# Patient Record
Sex: Male | Born: 1940 | Race: Black or African American | Hispanic: No | Marital: Married | Smoking: Former smoker
Health system: Southern US, Community
[De-identification: ages and names within clinical notes are randomized; demographics above are authoritative.]

## PROBLEM LIST (undated history)

## (undated) DIAGNOSIS — R630 Anorexia: Secondary | ICD-10-CM

## (undated) DIAGNOSIS — I10 Essential (primary) hypertension: Secondary | ICD-10-CM

## (undated) DIAGNOSIS — R634 Abnormal weight loss: Secondary | ICD-10-CM

## (undated) DIAGNOSIS — N400 Enlarged prostate without lower urinary tract symptoms: Secondary | ICD-10-CM

## (undated) DIAGNOSIS — I2699 Other pulmonary embolism without acute cor pulmonale: Secondary | ICD-10-CM

## (undated) DIAGNOSIS — C787 Secondary malignant neoplasm of liver and intrahepatic bile duct: Secondary | ICD-10-CM

## (undated) DIAGNOSIS — N390 Urinary tract infection, site not specified: Secondary | ICD-10-CM

## (undated) DIAGNOSIS — C259 Malignant neoplasm of pancreas, unspecified: Principal | ICD-10-CM

## (undated) DIAGNOSIS — E46 Unspecified protein-calorie malnutrition: Secondary | ICD-10-CM

## (undated) DIAGNOSIS — K649 Unspecified hemorrhoids: Secondary | ICD-10-CM

## (undated) DIAGNOSIS — K831 Obstruction of bile duct: Secondary | ICD-10-CM

## (undated) DIAGNOSIS — K219 Gastro-esophageal reflux disease without esophagitis: Secondary | ICD-10-CM

## (undated) DIAGNOSIS — C7800 Secondary malignant neoplasm of unspecified lung: Secondary | ICD-10-CM

## (undated) DIAGNOSIS — K8689 Other specified diseases of pancreas: Secondary | ICD-10-CM

## (undated) HISTORY — DX: Gastro-esophageal reflux disease without esophagitis: K21.9

## (undated) HISTORY — DX: Secondary malignant neoplasm of unspecified lung: C78.00

## (undated) HISTORY — DX: Obstruction of bile duct: K83.1

## (undated) HISTORY — DX: Essential (primary) hypertension: I10

## (undated) HISTORY — DX: Secondary malignant neoplasm of liver and intrahepatic bile duct: C78.7

## (undated) HISTORY — DX: Anorexia: R63.0

## (undated) HISTORY — DX: Other specified diseases of pancreas: K86.89

## (undated) HISTORY — DX: Malignant neoplasm of pancreas, unspecified: C25.9

## (undated) HISTORY — PX: PORTACATH PLACEMENT: SHX2246

## (undated) HISTORY — DX: Abnormal weight loss: R63.4

## (undated) HISTORY — DX: Other pulmonary embolism without acute cor pulmonale: I26.99

## (undated) HISTORY — DX: Benign prostatic hyperplasia without lower urinary tract symptoms: N40.0

## (undated) HISTORY — DX: Unspecified hemorrhoids: K64.9

---

## 2010-09-09 DIAGNOSIS — C787 Secondary malignant neoplasm of liver and intrahepatic bile duct: Secondary | ICD-10-CM

## 2010-09-09 DIAGNOSIS — R630 Anorexia: Secondary | ICD-10-CM

## 2010-09-09 DIAGNOSIS — C259 Malignant neoplasm of pancreas, unspecified: Secondary | ICD-10-CM

## 2010-09-09 DIAGNOSIS — R634 Abnormal weight loss: Secondary | ICD-10-CM

## 2010-09-09 DIAGNOSIS — K831 Obstruction of bile duct: Secondary | ICD-10-CM

## 2010-09-09 DIAGNOSIS — N400 Enlarged prostate without lower urinary tract symptoms: Secondary | ICD-10-CM

## 2010-09-09 DIAGNOSIS — C7800 Secondary malignant neoplasm of unspecified lung: Secondary | ICD-10-CM

## 2010-09-09 DIAGNOSIS — I2699 Other pulmonary embolism without acute cor pulmonale: Secondary | ICD-10-CM

## 2010-09-09 HISTORY — DX: Malignant neoplasm of pancreas, unspecified: C25.9

## 2010-09-09 HISTORY — DX: Other pulmonary embolism without acute cor pulmonale: I26.99

## 2010-09-09 HISTORY — DX: Secondary malignant neoplasm of unspecified lung: C78.00

## 2010-09-09 HISTORY — DX: Secondary malignant neoplasm of liver and intrahepatic bile duct: C78.7

## 2010-09-09 HISTORY — DX: Anorexia: R63.0

## 2010-09-09 HISTORY — DX: Obstruction of bile duct: K83.1

## 2010-09-09 HISTORY — DX: Benign prostatic hyperplasia without lower urinary tract symptoms: N40.0

## 2010-09-09 HISTORY — DX: Abnormal weight loss: R63.4

## 2011-05-30 ENCOUNTER — Other Ambulatory Visit: Payer: Self-pay | Admitting: Gastroenterology

## 2011-05-30 DIAGNOSIS — K3189 Other diseases of stomach and duodenum: Secondary | ICD-10-CM

## 2011-05-31 ENCOUNTER — Ambulatory Visit
Admission: RE | Admit: 2011-05-31 | Discharge: 2011-05-31 | Disposition: A | Discharge status: Home / Self Care | Payer: Medicare Other | Source: Ambulatory Visit | Attending: Gastroenterology | Admitting: Gastroenterology

## 2011-05-31 DIAGNOSIS — K3189 Other diseases of stomach and duodenum: Secondary | ICD-10-CM

## 2011-05-31 MED ORDER — IOHEXOL 300 MG/ML  SOLN: 125.0000 mL | Freq: Once | INTRAMUSCULAR | Status: AC | PRN

## 2011-06-05 ENCOUNTER — Other Ambulatory Visit: Payer: Self-pay | Admitting: Oncology

## 2011-06-05 ENCOUNTER — Encounter (HOSPITAL_BASED_OUTPATIENT_CLINIC_OR_DEPARTMENT_OTHER): Payer: Medicare Other | Admitting: Oncology

## 2011-06-05 ENCOUNTER — Encounter: Payer: Medicare Other | Admitting: Oncology

## 2011-06-05 DIAGNOSIS — C259 Malignant neoplasm of pancreas, unspecified: Secondary | ICD-10-CM

## 2011-06-05 LAB — CBC WITH DIFFERENTIAL/PLATELET
Basophils Absolute: 0 10*3/uL (ref 0.0–0.1)
HCT: 42.1 % (ref 38.4–49.9)
HGB: 14 g/dL (ref 13.0–17.1)
MONO#: 0.7 10*3/uL (ref 0.1–0.9)
NEUT#: 7.4 10*3/uL — ABNORMAL HIGH (ref 1.5–6.5)
NEUT%: 81.1 % — ABNORMAL HIGH (ref 39.0–75.0)
RDW: 14.8 % — ABNORMAL HIGH (ref 11.0–14.6)
WBC: 9.1 10*3/uL (ref 4.0–10.3)
lymph#: 1 10*3/uL (ref 0.9–3.3)

## 2011-06-05 LAB — COMPREHENSIVE METABOLIC PANEL
ALT: 278 U/L — ABNORMAL HIGH (ref 0–53)
AST: 168 U/L — ABNORMAL HIGH (ref 0–37)
Albumin: 3.5 g/dL (ref 3.5–5.2)
BUN: 16 mg/dL (ref 6–23)
CO2: 22 mEq/L (ref 19–32)
Calcium: 10 mg/dL (ref 8.4–10.5)
Chloride: 105 mEq/L (ref 96–112)
Creatinine, Ser: 0.76 mg/dL (ref 0.50–1.35)
Potassium: 3 mEq/L — ABNORMAL LOW (ref 3.5–5.3)

## 2011-06-06 ENCOUNTER — Other Ambulatory Visit: Payer: Self-pay | Admitting: Oncology

## 2011-06-06 DIAGNOSIS — C259 Malignant neoplasm of pancreas, unspecified: Secondary | ICD-10-CM

## 2011-06-06 LAB — CANCER ANTIGEN 19-9: CA 19-9: 123329.1 U/mL — ABNORMAL HIGH (ref <35.0)

## 2011-06-07 ENCOUNTER — Inpatient Hospital Stay (HOSPITAL_COMMUNITY)
Admission: AD | Admit: 2011-06-07 | Discharge: 2011-06-12 | DRG: 436 | Disposition: A | Discharge status: Home / Self Care | Payer: Medicare Other | Source: Ambulatory Visit | Attending: Oncology | Admitting: Oncology

## 2011-06-07 DIAGNOSIS — B9689 Other specified bacterial agents as the cause of diseases classified elsewhere: Secondary | ICD-10-CM | POA: Diagnosis present

## 2011-06-07 DIAGNOSIS — C787 Secondary malignant neoplasm of liver and intrahepatic bile duct: Secondary | ICD-10-CM | POA: Diagnosis present

## 2011-06-07 DIAGNOSIS — K5900 Constipation, unspecified: Secondary | ICD-10-CM | POA: Diagnosis not present

## 2011-06-07 DIAGNOSIS — K838 Other specified diseases of biliary tract: Secondary | ICD-10-CM | POA: Diagnosis present

## 2011-06-07 DIAGNOSIS — N138 Other obstructive and reflux uropathy: Secondary | ICD-10-CM | POA: Diagnosis present

## 2011-06-07 DIAGNOSIS — C259 Malignant neoplasm of pancreas, unspecified: Principal | ICD-10-CM

## 2011-06-07 DIAGNOSIS — C7800 Secondary malignant neoplasm of unspecified lung: Secondary | ICD-10-CM | POA: Diagnosis present

## 2011-06-07 DIAGNOSIS — I10 Essential (primary) hypertension: Secondary | ICD-10-CM | POA: Diagnosis present

## 2011-06-07 DIAGNOSIS — R339 Retention of urine, unspecified: Secondary | ICD-10-CM | POA: Diagnosis not present

## 2011-06-07 DIAGNOSIS — G893 Neoplasm related pain (acute) (chronic): Secondary | ICD-10-CM | POA: Diagnosis present

## 2011-06-07 DIAGNOSIS — R5381 Other malaise: Secondary | ICD-10-CM | POA: Diagnosis present

## 2011-06-07 DIAGNOSIS — K219 Gastro-esophageal reflux disease without esophagitis: Secondary | ICD-10-CM | POA: Diagnosis present

## 2011-06-07 DIAGNOSIS — C253 Malignant neoplasm of pancreatic duct: Secondary | ICD-10-CM

## 2011-06-07 DIAGNOSIS — N401 Enlarged prostate with lower urinary tract symptoms: Secondary | ICD-10-CM | POA: Diagnosis present

## 2011-06-07 DIAGNOSIS — E119 Type 2 diabetes mellitus without complications: Secondary | ICD-10-CM | POA: Diagnosis present

## 2011-06-07 DIAGNOSIS — R1011 Right upper quadrant pain: Secondary | ICD-10-CM | POA: Diagnosis present

## 2011-06-07 DIAGNOSIS — E440 Moderate protein-calorie malnutrition: Secondary | ICD-10-CM | POA: Diagnosis present

## 2011-06-07 HISTORY — PX: OTHER SURGICAL HISTORY: SHX169

## 2011-06-07 LAB — CBC
HCT: 39.6 % (ref 39.0–52.0)
MCH: 26.3 pg (ref 26.0–34.0)
MCHC: 32.6 g/dL (ref 30.0–36.0)
MCV: 80.8 fL (ref 78.0–100.0)
RDW: 15.5 % (ref 11.5–15.5)

## 2011-06-07 LAB — GLUCOSE, CAPILLARY: Glucose-Capillary: 138 mg/dL — ABNORMAL HIGH (ref 70–99)

## 2011-06-07 LAB — COMPREHENSIVE METABOLIC PANEL
ALT: 247 U/L — ABNORMAL HIGH (ref 0–53)
Albumin: 3.3 g/dL — ABNORMAL LOW (ref 3.5–5.2)
Alkaline Phosphatase: 231 U/L — ABNORMAL HIGH (ref 39–117)
Calcium: 10.2 mg/dL (ref 8.4–10.5)
Potassium: 4 mEq/L (ref 3.5–5.1)
Sodium: 138 mEq/L (ref 135–145)
Total Protein: 7.7 g/dL (ref 6.0–8.3)

## 2011-06-07 LAB — PROTIME-INR: INR: 1.29 (ref 0.00–1.49)

## 2011-06-07 MED ORDER — IOHEXOL 300 MG/ML  SOLN
50.0000 mL | Freq: Once | INTRAMUSCULAR | Status: AC | PRN
  Administered 2011-06-07: 30 mL via INTRATHECAL

## 2011-06-08 DIAGNOSIS — R17 Unspecified jaundice: Secondary | ICD-10-CM

## 2011-06-08 DIAGNOSIS — C253 Malignant neoplasm of pancreatic duct: Secondary | ICD-10-CM

## 2011-06-08 LAB — COMPREHENSIVE METABOLIC PANEL
AST: 91 U/L — ABNORMAL HIGH (ref 0–37)
Albumin: 2.8 g/dL — ABNORMAL LOW (ref 3.5–5.2)
BUN: 11 mg/dL (ref 6–23)
Chloride: 106 mEq/L (ref 96–112)
Creatinine, Ser: 0.65 mg/dL (ref 0.50–1.35)
Total Protein: 6.7 g/dL (ref 6.0–8.3)

## 2011-06-08 LAB — GLUCOSE, CAPILLARY
Glucose-Capillary: 163 mg/dL — ABNORMAL HIGH (ref 70–99)
Glucose-Capillary: 165 mg/dL — ABNORMAL HIGH (ref 70–99)
Glucose-Capillary: 173 mg/dL — ABNORMAL HIGH (ref 70–99)
Glucose-Capillary: 148 mg/dL — ABNORMAL HIGH (ref 70–99)
Glucose-Capillary: 154 mg/dL — ABNORMAL HIGH (ref 70–99)

## 2011-06-08 LAB — CBC
HCT: 36.8 % — ABNORMAL LOW (ref 39.0–52.0)
MCHC: 32.6 g/dL (ref 30.0–36.0)
MCV: 81.8 fL (ref 78.0–100.0)
Platelets: 206 10*3/uL (ref 150–400)
RDW: 15.4 % (ref 11.5–15.5)
WBC: 10.4 10*3/uL (ref 4.0–10.5)

## 2011-06-08 NOTE — H&P (Signed)
NAMEMarland Kitchen  DELVIS, KAU NO.:  [id number]  MEDICAL RECORD NO.:  [id number]  LOCATION:  1309                         FACILITY:  [facility]  PHYSICIAN:  Exie Parody, M.D.        DATE OF BIRTH:  Jul 05, 1941  DATE OF ADMISSION:  06/07/2011 DATE OF DISCHARGE:                             HISTORY & PHYSICAL   CHIEF COMPLAINT:  Jaundice.  REASON FOR ADMISSION:  Placement of percutaneous bili drainage and admission to ensure he has no sepsis.  HISTORY OF PRESENT ILLNESS:  Mr. Nyborg is a 70 year old African American man with history of diabetes mellitus type 2 and hypertension. He was subsequently diagnosed with metastatic pancreatic cancer.  His disease was suspected in the liver and in the lungs.  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 He underwent percutaneous placement of biliary drainage by Dr. Ruel Favors from IR today.  Routine admission for the next few days to ensure there is enough biliary sepsis given his high risk.  I saw the patient today after admission in the hospital room, fourth floor.  He was there with his wife and his daughter.  He reported that he has some mild abdominal discomfort from the cancer in addition to the procedure, however, it was not so severe.  He thinks that the procedure has relieved some pressure and some pain in the right upper quadrant as well.  He has some mild nausea, however, no vomiting.  He has fatigue and decreased appetite.  He denies shortness of breath, chest pain, abdominal pain, abdominal swelling, melena, hematochezia, hematuria. The rest of the 14-point review of system was negative.  PAST MEDICAL HISTORY: 1. Recently diagnosed history of metastatic pancreatic cancer. 2. Diabetes mellitus type 2. 3. Hypertension. 4. Gastroesophageal reflux disease. 5. History of hemorrhoid. 6. Erectile dysfunction. 7. Insomnia. 8. Benign prostatic hypertrophy.  PAST SURGICAL HISTORY:   None.  CURRENT OUTPATIENT MEDICATIONS:  Amlodipine, aspirin, benazepril, omeprazole, sitagliptin, sucralfate.  ALLERGIES:  No known drug allergy.  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  He used to smoke cigarette.  He quit 30 years ago.  He used to drink alcohol heavily, however, quit 30 years ago as well.  He has two daughters, age 74 and age 21.  The 76 year old daughter lives in [place]; the 9 year old daughter lives here and she is here with him today, described as supportive.  She works for a [employer].  FAMILY HISTORY:  Father deceased from stroke and mother deceased from cervical cancer.  He had two brothers, both deceased; one from congestive heart failure and the other from valvular disease and heart attack.  The rest of family history was negative for breast cancer, brain cancer, endometrial cancer, colon cancer.  One of his brother had metastatic pancreatic cancer as well.  PHYSICAL EXAM:  General:  well-nourished in no acute distress.  Eyes:  jaundice.  ENT:  There were no oropharyngeal lesions.  Neck was without thyromegaly.  Lymphatics:  Negative cervical, supraclavicular or axillary adenopathy.  Respiratory: lungs were  clear bilaterally without wheezing  or crackles.  Cardiovascular:  Regular rate and rhythm, S1/S2, without murmur, rub or gallop.  There was no pedal edema.  GI:  abdomen was soft, flat, nontender, nondistended, without organomegaly.   Percutaneous biliary drainage tube in place without bleeding.  Muscoloskeletal:  no spinal tenderness of palpation of vertebral spine.  Skin exam was without echymosis, petichae.  Neuro exam was nonfocal.  Patient was able to get on and off exam table  without assistance.  Gait was normal.  Patient was alerted and oriented.  Attention was good.   Language was appropriate.  Mood was normal without depression.  Speech was not pressured.  Thought  content was not tangential.     LABS:  Showed WBC 9.5, hemoglobin 12.9, platelet count of 228, bilirubin of 9.5, alk phos 231, AST 152, ALT 247, total protein 7.7, albumin of 3.3, calcium 10.2.  ASSESSMENT AND PLAN:  Metastatic pancreatic cancer:  I discussed with him and his relatives that we will see him again in clinic next week to see whether he has improving cholestasis.  If his T-bili is less than 2, then I will encourage him to proceed in the gemcitabine plus/minus Onconova clinical trial.  If his bilirubin improves, but however not good enough, then we may consider FOLFIRINOX.   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  We will observe him for the next few days to ensure he has no sign of sepsis or infection.  If he has no sign of infections, then on Monday June 10, 2011,  IR will place a Port-A-Cath and can go home and get outpatient workup with CT of the chest and a PET scan before chemotherapy starting.  Diabetes mellitus:  He is on sitagliptin.  We will have him check his sugar four times a day while he is on hospital and insulin sliding scale as needed.  Hypertension:  He is on amlodipine and benazepril.  Gastroesophageal reflux disease:  He is on omeprazole and sucralfate.  Code status:  Full code.  Protein-calorie malnutrition:  This is mild to moderate from his metastatic pancreatic cancer.  I requested a nutritional consultation.  DVT prophylaxis:  He is on SCD compression stocking until he has evidence of complications from a biliary drainage placement.     Exie Parody, M.D.     HTH/MEDQ  D:  06/07/2011  T:  06/07/2011  Job:  [id number]  cc:   Jordan Hawks. [name], MD Fax: [phone]  Ruel Favors, MD  Electronically Signed by Jethro Bolus MD on 06/08/2011 10:37:35 AM

## 2011-06-09 LAB — CBC
HCT: 36.6 % — ABNORMAL LOW (ref 39.0–52.0)
Hemoglobin: 11.9 g/dL — ABNORMAL LOW (ref 13.0–17.0)
MCH: 26.5 pg (ref 26.0–34.0)
MCV: 81.5 fL (ref 78.0–100.0)
Platelets: 192 10*3/uL (ref 150–400)
RBC: 4.49 MIL/uL (ref 4.22–5.81)
WBC: 10.2 10*3/uL (ref 4.0–10.5)

## 2011-06-09 LAB — COMPREHENSIVE METABOLIC PANEL
AST: 68 U/L — ABNORMAL HIGH (ref 0–37)
BUN: 9 mg/dL (ref 6–23)
CO2: 24 mEq/L (ref 19–32)
Chloride: 104 mEq/L (ref 96–112)
Creatinine, Ser: 0.57 mg/dL (ref 0.50–1.35)
GFR calc Af Amer: >60 mL/min (ref >60)
GFR calc non Af Amer: >60 mL/min (ref >60)
Glucose, Bld: 150 mg/dL — ABNORMAL HIGH (ref 70–99)
Total Bilirubin: 6.4 mg/dL — ABNORMAL HIGH (ref 0.3–1.2)

## 2011-06-09 LAB — GLUCOSE, CAPILLARY: Glucose-Capillary: 143 mg/dL — ABNORMAL HIGH (ref 70–99)

## 2011-06-10 ENCOUNTER — Other Ambulatory Visit (HOSPITAL_COMMUNITY): Payer: Medicare Other

## 2011-06-10 ENCOUNTER — Inpatient Hospital Stay (HOSPITAL_COMMUNITY): Payer: Medicare Other

## 2011-06-10 DIAGNOSIS — K838 Other specified diseases of biliary tract: Secondary | ICD-10-CM

## 2011-06-10 DIAGNOSIS — C253 Malignant neoplasm of pancreatic duct: Secondary | ICD-10-CM

## 2011-06-10 LAB — CBC
HCT: 36.7 % — ABNORMAL LOW (ref 39.0–52.0)
MCH: 26.5 pg (ref 26.0–34.0)
MCV: 81.2 fL (ref 78.0–100.0)
RDW: 15.2 % (ref 11.5–15.5)
WBC: 9.7 10*3/uL (ref 4.0–10.5)

## 2011-06-10 LAB — PROTIME-INR
INR: 1.42 (ref 0.00–1.49)
Prothrombin Time: 17.6 seconds — ABNORMAL HIGH (ref 11.6–15.2)

## 2011-06-10 LAB — COMPREHENSIVE METABOLIC PANEL
BUN: 8 mg/dL (ref 6–23)
CO2: 24 mEq/L (ref 19–32)
Chloride: 101 mEq/L (ref 96–112)
Creatinine, Ser: 0.52 mg/dL (ref 0.50–1.35)
GFR calc non Af Amer: >90 mL/min (ref >90)
Total Bilirubin: 6.8 mg/dL — ABNORMAL HIGH (ref 0.3–1.2)

## 2011-06-10 LAB — GLUCOSE, CAPILLARY: Glucose-Capillary: 144 mg/dL — ABNORMAL HIGH (ref 70–99)

## 2011-06-11 LAB — CBC
HCT: 34.9 % — ABNORMAL LOW (ref 39.0–52.0)
Hemoglobin: 11.3 g/dL — ABNORMAL LOW (ref 13.0–17.0)
RBC: 4.32 MIL/uL (ref 4.22–5.81)
WBC: 8.7 10*3/uL (ref 4.0–10.5)

## 2011-06-11 LAB — COMPREHENSIVE METABOLIC PANEL
ALT: 103 U/L — ABNORMAL HIGH (ref 0–53)
Alkaline Phosphatase: 163 U/L — ABNORMAL HIGH (ref 39–117)
BUN: 8 mg/dL (ref 6–23)
CO2: 24 mEq/L (ref 19–32)
Chloride: 104 mEq/L (ref 96–112)
GFR calc Af Amer: >90 mL/min (ref >90)
Glucose, Bld: 158 mg/dL — ABNORMAL HIGH (ref 70–99)
Potassium: 3.1 mEq/L — ABNORMAL LOW (ref 3.5–5.1)
Sodium: 137 mEq/L (ref 135–145)
Total Bilirubin: 6.9 mg/dL — ABNORMAL HIGH (ref 0.3–1.2)

## 2011-06-11 LAB — BODY FLUID CULTURE: Gram Stain: NONE SEEN

## 2011-06-11 LAB — GLUCOSE, CAPILLARY
Glucose-Capillary: 154 mg/dL — ABNORMAL HIGH (ref 70–99)
Glucose-Capillary: 135 mg/dL — ABNORMAL HIGH (ref 70–99)
Glucose-Capillary: 127 mg/dL — ABNORMAL HIGH (ref 70–99)

## 2011-06-12 ENCOUNTER — Other Ambulatory Visit (HOSPITAL_COMMUNITY): Payer: Medicare Other

## 2011-06-12 ENCOUNTER — Inpatient Hospital Stay (HOSPITAL_COMMUNITY)
Admission: RE | Admit: 2011-06-12 | Discharge: 2011-06-12 | Payer: Medicare Other | Source: Ambulatory Visit | Attending: Oncology | Admitting: Oncology

## 2011-06-12 LAB — CBC
HCT: 33.2 % — ABNORMAL LOW (ref 39.0–52.0)
MCV: 79.8 fL (ref 78.0–100.0)
Platelets: 190 10*3/uL (ref 150–400)
RBC: 4.16 MIL/uL — ABNORMAL LOW (ref 4.22–5.81)
RDW: 15.4 % (ref 11.5–15.5)
WBC: 8.2 10*3/uL (ref 4.0–10.5)

## 2011-06-12 LAB — BASIC METABOLIC PANEL
CO2: 26 mEq/L (ref 19–32)
Calcium: 9.1 mg/dL (ref 8.4–10.5)
Chloride: 105 mEq/L (ref 96–112)
Creatinine, Ser: 0.5 mg/dL (ref 0.50–1.35)
GFR calc Af Amer: >90 mL/min (ref >90)
Sodium: 138 mEq/L (ref 135–145)

## 2011-06-12 LAB — GLUCOSE, CAPILLARY

## 2011-06-18 ENCOUNTER — Encounter (HOSPITAL_COMMUNITY)
Admit: 2011-06-18 | Discharge: 2011-06-18 | Disposition: A | Discharge status: Home / Self Care | Payer: Medicare Other | Attending: Oncology | Admitting: Oncology

## 2011-06-18 DIAGNOSIS — N400 Enlarged prostate without lower urinary tract symptoms: Secondary | ICD-10-CM | POA: Insufficient documentation

## 2011-06-18 DIAGNOSIS — R918 Other nonspecific abnormal finding of lung field: Secondary | ICD-10-CM | POA: Insufficient documentation

## 2011-06-18 DIAGNOSIS — N323 Diverticulum of bladder: Secondary | ICD-10-CM | POA: Insufficient documentation

## 2011-06-18 DIAGNOSIS — R599 Enlarged lymph nodes, unspecified: Secondary | ICD-10-CM | POA: Insufficient documentation

## 2011-06-18 DIAGNOSIS — C259 Malignant neoplasm of pancreas, unspecified: Secondary | ICD-10-CM

## 2011-06-18 DIAGNOSIS — C787 Secondary malignant neoplasm of liver and intrahepatic bile duct: Secondary | ICD-10-CM | POA: Insufficient documentation

## 2011-06-18 DIAGNOSIS — E041 Nontoxic single thyroid nodule: Secondary | ICD-10-CM | POA: Insufficient documentation

## 2011-06-18 DIAGNOSIS — C250 Malignant neoplasm of head of pancreas: Secondary | ICD-10-CM | POA: Insufficient documentation

## 2011-06-18 DIAGNOSIS — N210 Calculus in bladder: Secondary | ICD-10-CM | POA: Insufficient documentation

## 2011-06-18 DIAGNOSIS — J90 Pleural effusion, not elsewhere classified: Secondary | ICD-10-CM | POA: Insufficient documentation

## 2011-06-18 LAB — GLUCOSE, CAPILLARY: Glucose-Capillary: 154 mg/dL — ABNORMAL HIGH (ref 70–99)

## 2011-06-18 MED ORDER — FLUDEOXYGLUCOSE F - 18 (FDG) INJECTION
17.4000 | Freq: Once | INTRAVENOUS | Status: AC | PRN
  Administered 2011-06-18: 17.4 via INTRAVENOUS

## 2011-06-18 MED ORDER — IOHEXOL 300 MG/ML  SOLN
80.0000 mL | Freq: Once | INTRAMUSCULAR | Status: AC | PRN
  Administered 2011-06-18: 80 mL via INTRAVENOUS

## 2011-06-19 ENCOUNTER — Other Ambulatory Visit: Payer: Self-pay | Admitting: Oncology

## 2011-06-19 ENCOUNTER — Encounter: Payer: Medicare Other | Admitting: Oncology

## 2011-06-19 ENCOUNTER — Encounter (HOSPITAL_BASED_OUTPATIENT_CLINIC_OR_DEPARTMENT_OTHER): Payer: Medicare Other | Admitting: Oncology

## 2011-06-19 DIAGNOSIS — I2699 Other pulmonary embolism without acute cor pulmonale: Secondary | ICD-10-CM

## 2011-06-19 DIAGNOSIS — C259 Malignant neoplasm of pancreas, unspecified: Secondary | ICD-10-CM

## 2011-06-19 DIAGNOSIS — C801 Malignant (primary) neoplasm, unspecified: Secondary | ICD-10-CM

## 2011-06-19 DIAGNOSIS — C787 Secondary malignant neoplasm of liver and intrahepatic bile duct: Secondary | ICD-10-CM

## 2011-06-19 DIAGNOSIS — C7800 Secondary malignant neoplasm of unspecified lung: Secondary | ICD-10-CM

## 2011-06-19 LAB — COMPREHENSIVE METABOLIC PANEL
ALT: 125 U/L — ABNORMAL HIGH (ref 0–53)
AST: 116 U/L — ABNORMAL HIGH (ref 0–37)
Albumin: 3.6 g/dL (ref 3.5–5.2)
Alkaline Phosphatase: 160 U/L — ABNORMAL HIGH (ref 39–117)
BUN: 18 mg/dL (ref 6–23)
Calcium: 9.6 mg/dL (ref 8.4–10.5)
Chloride: 101 mEq/L (ref 96–112)
Potassium: 3.9 mEq/L (ref 3.5–5.3)
Sodium: 137 mEq/L (ref 135–145)
Total Protein: 7.3 g/dL (ref 6.0–8.3)

## 2011-06-19 LAB — CBC WITH DIFFERENTIAL/PLATELET
BASO%: 0.5 % (ref 0.0–2.0)
EOS%: 1.4 % (ref 0.0–7.0)
HGB: 12.8 g/dL — ABNORMAL LOW (ref 13.0–17.1)
MCH: 27.8 pg (ref 27.2–33.4)
MCHC: 33.6 g/dL (ref 32.0–36.0)
MCV: 82.5 fL (ref 79.3–98.0)
MONO%: 8.7 % (ref 0.0–14.0)
RBC: 4.61 10*6/uL (ref 4.20–5.82)
RDW: 15.8 % — ABNORMAL HIGH (ref 11.0–14.6)
lymph#: 1.3 10*3/uL (ref 0.9–3.3)

## 2011-06-20 ENCOUNTER — Encounter (HOSPITAL_BASED_OUTPATIENT_CLINIC_OR_DEPARTMENT_OTHER): Payer: Medicare Other | Admitting: Oncology

## 2011-06-20 ENCOUNTER — Encounter: Payer: Self-pay | Admitting: Oncology

## 2011-06-20 DIAGNOSIS — R630 Anorexia: Secondary | ICD-10-CM | POA: Insufficient documentation

## 2011-06-20 DIAGNOSIS — N400 Enlarged prostate without lower urinary tract symptoms: Secondary | ICD-10-CM | POA: Insufficient documentation

## 2011-06-20 DIAGNOSIS — C7800 Secondary malignant neoplasm of unspecified lung: Secondary | ICD-10-CM | POA: Insufficient documentation

## 2011-06-20 DIAGNOSIS — C787 Secondary malignant neoplasm of liver and intrahepatic bile duct: Secondary | ICD-10-CM | POA: Insufficient documentation

## 2011-06-20 DIAGNOSIS — K831 Obstruction of bile duct: Secondary | ICD-10-CM

## 2011-06-20 DIAGNOSIS — R634 Abnormal weight loss: Secondary | ICD-10-CM

## 2011-06-20 DIAGNOSIS — C259 Malignant neoplasm of pancreas, unspecified: Secondary | ICD-10-CM

## 2011-06-20 DIAGNOSIS — I2699 Other pulmonary embolism without acute cor pulmonale: Secondary | ICD-10-CM

## 2011-06-21 ENCOUNTER — Encounter (HOSPITAL_BASED_OUTPATIENT_CLINIC_OR_DEPARTMENT_OTHER): Payer: Medicare Other | Admitting: Oncology

## 2011-06-21 DIAGNOSIS — I2699 Other pulmonary embolism without acute cor pulmonale: Secondary | ICD-10-CM

## 2011-06-22 ENCOUNTER — Encounter (HOSPITAL_BASED_OUTPATIENT_CLINIC_OR_DEPARTMENT_OTHER): Payer: Medicare Other | Admitting: Oncology

## 2011-06-22 DIAGNOSIS — I2699 Other pulmonary embolism without acute cor pulmonale: Secondary | ICD-10-CM

## 2011-06-23 ENCOUNTER — Encounter (HOSPITAL_BASED_OUTPATIENT_CLINIC_OR_DEPARTMENT_OTHER): Payer: Medicare Other | Admitting: Oncology

## 2011-06-23 DIAGNOSIS — I2699 Other pulmonary embolism without acute cor pulmonale: Secondary | ICD-10-CM

## 2011-06-24 ENCOUNTER — Encounter (HOSPITAL_BASED_OUTPATIENT_CLINIC_OR_DEPARTMENT_OTHER): Payer: Medicare Other | Admitting: Oncology

## 2011-06-24 DIAGNOSIS — I2699 Other pulmonary embolism without acute cor pulmonale: Secondary | ICD-10-CM

## 2011-06-25 ENCOUNTER — Encounter (HOSPITAL_BASED_OUTPATIENT_CLINIC_OR_DEPARTMENT_OTHER): Payer: Medicare Other | Admitting: Oncology

## 2011-06-25 DIAGNOSIS — I2699 Other pulmonary embolism without acute cor pulmonale: Secondary | ICD-10-CM

## 2011-06-25 DIAGNOSIS — C259 Malignant neoplasm of pancreas, unspecified: Secondary | ICD-10-CM

## 2011-06-25 DIAGNOSIS — Z5111 Encounter for antineoplastic chemotherapy: Secondary | ICD-10-CM

## 2011-06-26 ENCOUNTER — Encounter (HOSPITAL_BASED_OUTPATIENT_CLINIC_OR_DEPARTMENT_OTHER): Payer: Medicare Other | Admitting: Oncology

## 2011-06-26 DIAGNOSIS — I2699 Other pulmonary embolism without acute cor pulmonale: Secondary | ICD-10-CM

## 2011-06-27 ENCOUNTER — Other Ambulatory Visit (HOSPITAL_COMMUNITY): Payer: Self-pay | Admitting: Oncology

## 2011-06-27 ENCOUNTER — Encounter (HOSPITAL_BASED_OUTPATIENT_CLINIC_OR_DEPARTMENT_OTHER): Payer: Medicare Other | Admitting: Oncology

## 2011-06-27 DIAGNOSIS — Z5189 Encounter for other specified aftercare: Secondary | ICD-10-CM

## 2011-06-27 DIAGNOSIS — C259 Malignant neoplasm of pancreas, unspecified: Secondary | ICD-10-CM

## 2011-06-27 DIAGNOSIS — I2699 Other pulmonary embolism without acute cor pulmonale: Secondary | ICD-10-CM

## 2011-06-28 ENCOUNTER — Encounter (HOSPITAL_BASED_OUTPATIENT_CLINIC_OR_DEPARTMENT_OTHER): Payer: Medicare Other | Admitting: Oncology

## 2011-06-29 ENCOUNTER — Encounter: Payer: Self-pay | Admitting: Oncology

## 2011-06-29 ENCOUNTER — Encounter: Payer: Medicare Other | Admitting: Oncology

## 2011-06-29 ENCOUNTER — Other Ambulatory Visit: Payer: Self-pay | Admitting: Oncology

## 2011-06-29 DIAGNOSIS — K219 Gastro-esophageal reflux disease without esophagitis: Secondary | ICD-10-CM

## 2011-06-29 DIAGNOSIS — I10 Essential (primary) hypertension: Secondary | ICD-10-CM | POA: Insufficient documentation

## 2011-06-29 DIAGNOSIS — K649 Unspecified hemorrhoids: Secondary | ICD-10-CM

## 2011-06-29 DIAGNOSIS — K8689 Other specified diseases of pancreas: Secondary | ICD-10-CM

## 2011-06-29 DIAGNOSIS — I2699 Other pulmonary embolism without acute cor pulmonale: Secondary | ICD-10-CM

## 2011-06-29 HISTORY — DX: Unspecified hemorrhoids: K64.9

## 2011-06-29 HISTORY — DX: Gastro-esophageal reflux disease without esophagitis: K21.9

## 2011-06-29 HISTORY — DX: Other specified diseases of pancreas: K86.89

## 2011-06-29 HISTORY — DX: Essential (primary) hypertension: I10

## 2011-06-29 NOTE — Discharge Summary (Signed)
NAMEMarland Kitchen  Mitchell Hines NO.:  [id number]  MEDICAL RECORD NO.:  [id number]  LOCATION:  1309                         FACILITY:  [facility]  PHYSICIAN:  Exie Parody, M.D.        DATE OF BIRTH:  July 09, 1941  DATE OF ADMISSION:  06/07/2011 DATE OF DISCHARGE:  06/12/2011                              DISCHARGE SUMMARY   DISCHARGE DIAGNOSES: 1. Cholestasis. 2. Metastatic pancreatic adenocarcinoma. 3. Calorie-protein malnutrition. 4. Mild deconditioning. 5. Benign prostatic hypertrophy with urinary retention. 6. Diabetes mellitus.  PROCEDURE AND CONSULTATION: 1. With Dr. Ruel Favors, Interventional Radiology for placement of     percutaneous biliary drainage on June 07, 2011, and placement     of Port-A-Cath on June 10, 2011. 2. Consultation with Urology on June 10, 2011, with Dr. Lorin Picket     MacDiarmid for urinary retention and placement of indwelling Foley     cath.  BRIEF HISTORY OF PRESENT ILLNESS:  [name] is a 70 year old African American man with metastatic pancreatic adenocarcinoma with mets to the liver and lungs.  He had cholestasis of bilirubin of 9.  The ampullary could not be stented given the obstructions of the tumor and technically not feasible per Dr. Jeani Hawking from GI.  Therefore, he underwent interventional radiology placement of percutaneous biliary drainage tube on June 07, 2011, admission was to make sure he does not have biliary sepsis.  HOSPITAL COURSE: 1. Cholestasis:  His bilirubin trended down nicely from peak of 9 down     to range of 6 after placement of percutaneous biliary drainage.     However, it did not further decrease this.  His AST, ALT, and     alkaline phosphatase however did improve subsequently.  He had      biliary fluid sent for gram stain and culture.  The gram stain showed no     WBC seen, however, moderate gram-positive cocci in pairs.     Therefore, on June 10, 2011, decision was to place him on  vancomycin given the fact that he had moderate streptococcus group     D growing on culture.  He remained afebrile on Vancomycin without blood     culture positivity.  Therefore, on the day of discharge, I  discontinued     IV  vancomycin and placed him on doxycycline 100 mg p.o. b.i.d..     He did not have any fever, hypotension, tachycardia to suggest     sepsis during the hospital course.  2. Metastatic pancreatic cancer:  We are waiting outpatient CT of the     chest and PET scan to further characterize his cancer.  His CA19-9     was extremely elevated and my impression was not to pursue any     biopsy to prove distant metastases because of the high     pretest probability.  However, the patient and his wife would like     to get a CT first before deciding later this week.  3. Diabetes mellitus, type II.  He was on home sitagliptin in addition to     insulin sliding scale.  4. Hypertension, well controlled on outpatient  control of lisinopril,     which was converted to benazepril and amlodipine.  5. Urinary retention:  The day prior to discharge, he complained of     pelvic fullness and was found to have     postvoid residual of about 600 cc.  He was started on terazosin  to     avoid cholestasis problem with Flomax.  He, during the night,     developed urinary retention again with a lot of abdominal     distention and pelvic distention.  He was noted to have again 600     cc of postvoid residual.  Attempt was to place in a Foley catheter,     however, he had hematuria from the procedure.  Therefore, Dr. Lorin Picket     MacDiarmid kindly saw the patient that night and placed an indwelling     Foley cath.  As the patient had no further indications to remain in the     hospital from oncology standpoint, I discussed the case     with Dr. Alfredo Martinez, who advised the patient to be remained     on terazosin and have indwelling Folley Cath until he can be seen     early next week.  I  will contact Dr. Mina Marble nurse to ensure     that he has an appointment early next week with him.  6. Calorie-protein malnutrition.  He was able to eat; however, his      apetite was quite suppressed due to cancer-induced anorexia.       In order to improve his nutrition status, I started him on Marinol     2.5 mg p.o. b.i.d.  8. Mild deconditioning.  This is from his metastatic cancer.  No     indications for PT/OT at this time as he is able to ambulate by     himself without problem.  DISCHARGE CONDITION:  Slightly improved.  DISCHARGE DIET:  ADA diet 2000 calorie a day.  DISCHARGE FOLLOWUP:  With me in clinic on June 14, 2011, and with Dr. Lorin Picket MacDiarmid in the second week of October 2012.  DISCHARGE MEDICATIONS: 1. Terazosin 1 mg p.o. q.h.s. 2. Doxycycline 100 mg p.o. b.i.d. 3. Amlodipine 10 mg p.o. q.a.m. 4. Aspirin 81 mg p.o. daily. 5. Vicodin elixir p.r.n. 6. Sitagliptin 1 tablet p.o. q.a.m. 7. Lisinopril 40 mg p.o. q.a.m. 8. Omeprazole 40 mg p.o. q.a.m. 9. Zofran p.r.n. 10.Compazine p.r.n. 11.Sucralfate 1 g 4 times a day p.r.n.  ADDENDUM:  this discharge summary was originally dictated for 06/11/11. During discharge procedure, patient complained of not feeling well but  no specific focalizing source.  He also has indigestion and diarrhea  most likely due to pancreatic insufficiency.  Thus, he was started on  Creon, given pain medication, and observed overnight.  The next day,  he reported feeling better with resolution of diarrhea and improvement  of indigestion.  He was thus discharged home on 06/12/11.   Exie Parody, M.D.     HTH/MEDQ  D:  06/11/2011  T:  06/12/2011  Job:  [id number]  Electronically Signed by Jethro Bolus MD on 06/29/2011 09:37:34 AM

## 2011-06-30 ENCOUNTER — Other Ambulatory Visit: Payer: Self-pay | Admitting: Oncology

## 2011-06-30 ENCOUNTER — Encounter (HOSPITAL_BASED_OUTPATIENT_CLINIC_OR_DEPARTMENT_OTHER): Payer: Medicare Other | Admitting: Oncology

## 2011-07-01 DIAGNOSIS — I2699 Other pulmonary embolism without acute cor pulmonale: Secondary | ICD-10-CM

## 2011-07-02 ENCOUNTER — Encounter (HOSPITAL_BASED_OUTPATIENT_CLINIC_OR_DEPARTMENT_OTHER): Payer: Medicare Other | Admitting: Oncology

## 2011-07-02 DIAGNOSIS — I2699 Other pulmonary embolism without acute cor pulmonale: Secondary | ICD-10-CM

## 2011-07-03 ENCOUNTER — Encounter (HOSPITAL_BASED_OUTPATIENT_CLINIC_OR_DEPARTMENT_OTHER): Payer: Medicare Other | Admitting: Oncology

## 2011-07-03 DIAGNOSIS — I2699 Other pulmonary embolism without acute cor pulmonale: Secondary | ICD-10-CM

## 2011-07-04 ENCOUNTER — Encounter (HOSPITAL_BASED_OUTPATIENT_CLINIC_OR_DEPARTMENT_OTHER): Payer: Medicare Other | Admitting: Oncology

## 2011-07-04 DIAGNOSIS — I2699 Other pulmonary embolism without acute cor pulmonale: Secondary | ICD-10-CM

## 2011-07-05 ENCOUNTER — Encounter (HOSPITAL_BASED_OUTPATIENT_CLINIC_OR_DEPARTMENT_OTHER): Payer: Medicare Other | Admitting: Oncology

## 2011-07-05 DIAGNOSIS — I2699 Other pulmonary embolism without acute cor pulmonale: Secondary | ICD-10-CM

## 2011-07-06 ENCOUNTER — Encounter: Payer: Medicare Other | Admitting: Oncology

## 2011-07-07 ENCOUNTER — Encounter: Payer: Medicare Other | Admitting: Oncology

## 2011-07-08 ENCOUNTER — Other Ambulatory Visit: Payer: Self-pay | Admitting: Oncology

## 2011-07-08 ENCOUNTER — Encounter: Payer: Self-pay | Admitting: *Deleted

## 2011-07-08 ENCOUNTER — Encounter (HOSPITAL_BASED_OUTPATIENT_CLINIC_OR_DEPARTMENT_OTHER): Payer: Medicare Other | Admitting: Oncology

## 2011-07-08 DIAGNOSIS — C259 Malignant neoplasm of pancreas, unspecified: Secondary | ICD-10-CM

## 2011-07-08 DIAGNOSIS — R1110 Vomiting, unspecified: Secondary | ICD-10-CM

## 2011-07-08 DIAGNOSIS — I2699 Other pulmonary embolism without acute cor pulmonale: Secondary | ICD-10-CM

## 2011-07-08 LAB — CBC WITH DIFFERENTIAL/PLATELET
BASO%: 1 % (ref 0.0–2.0)
Basophils Absolute: 0 10*3/uL (ref 0.0–0.1)
EOS%: 1.6 % (ref 0.0–7.0)
MCH: 26.8 pg — ABNORMAL LOW (ref 27.2–33.4)
MCHC: 33.9 g/dL (ref 32.0–36.0)
MCV: 79 fL — ABNORMAL LOW (ref 79.3–98.0)
MONO%: 16.8 % — ABNORMAL HIGH (ref 0.0–14.0)
RBC: 4.71 10*6/uL (ref 4.20–5.82)
RDW: 14.9 % — ABNORMAL HIGH (ref 11.0–14.6)

## 2011-07-09 ENCOUNTER — Encounter (HOSPITAL_BASED_OUTPATIENT_CLINIC_OR_DEPARTMENT_OTHER): Payer: Medicare Other | Admitting: Oncology

## 2011-07-09 DIAGNOSIS — I2699 Other pulmonary embolism without acute cor pulmonale: Secondary | ICD-10-CM

## 2011-07-09 LAB — COMPREHENSIVE METABOLIC PANEL
ALT: 201 U/L — ABNORMAL HIGH (ref 0–53)
AST: 126 U/L — ABNORMAL HIGH (ref 0–37)
Albumin: 3.8 g/dL (ref 3.5–5.2)
Alkaline Phosphatase: 140 U/L — ABNORMAL HIGH (ref 39–117)
BUN: 29 mg/dL — ABNORMAL HIGH (ref 6–23)
Potassium: 3.4 mEq/L — ABNORMAL LOW (ref 3.5–5.3)

## 2011-07-10 ENCOUNTER — Other Ambulatory Visit: Payer: Self-pay | Admitting: Oncology

## 2011-07-10 ENCOUNTER — Encounter (HOSPITAL_BASED_OUTPATIENT_CLINIC_OR_DEPARTMENT_OTHER): Payer: Medicare Other | Admitting: Oncology

## 2011-07-10 DIAGNOSIS — C259 Malignant neoplasm of pancreas, unspecified: Secondary | ICD-10-CM

## 2011-07-10 DIAGNOSIS — C801 Malignant (primary) neoplasm, unspecified: Secondary | ICD-10-CM

## 2011-07-10 DIAGNOSIS — I2699 Other pulmonary embolism without acute cor pulmonale: Secondary | ICD-10-CM

## 2011-07-10 DIAGNOSIS — C7800 Secondary malignant neoplasm of unspecified lung: Secondary | ICD-10-CM

## 2011-07-10 DIAGNOSIS — Z5111 Encounter for antineoplastic chemotherapy: Secondary | ICD-10-CM

## 2011-07-10 DIAGNOSIS — C787 Secondary malignant neoplasm of liver and intrahepatic bile duct: Secondary | ICD-10-CM

## 2011-07-10 LAB — BASIC METABOLIC PANEL
Potassium: 3.4 mEq/L — ABNORMAL LOW (ref 3.5–5.3)
Sodium: 135 mEq/L (ref 135–145)

## 2011-07-10 MED ORDER — SODIUM CHLORIDE 0.9 % IV SOLN: INTRAVENOUS | Status: DC

## 2011-07-11 ENCOUNTER — Encounter (HOSPITAL_BASED_OUTPATIENT_CLINIC_OR_DEPARTMENT_OTHER): Payer: Medicare Other | Admitting: Oncology

## 2011-07-11 DIAGNOSIS — C259 Malignant neoplasm of pancreas, unspecified: Secondary | ICD-10-CM

## 2011-07-11 DIAGNOSIS — Z5111 Encounter for antineoplastic chemotherapy: Secondary | ICD-10-CM

## 2011-07-11 DIAGNOSIS — C7800 Secondary malignant neoplasm of unspecified lung: Secondary | ICD-10-CM

## 2011-07-11 DIAGNOSIS — C801 Malignant (primary) neoplasm, unspecified: Secondary | ICD-10-CM

## 2011-07-11 DIAGNOSIS — I2699 Other pulmonary embolism without acute cor pulmonale: Secondary | ICD-10-CM

## 2011-07-11 DIAGNOSIS — C787 Secondary malignant neoplasm of liver and intrahepatic bile duct: Secondary | ICD-10-CM

## 2011-07-12 ENCOUNTER — Telehealth: Payer: Self-pay | Admitting: Oncology

## 2011-07-12 ENCOUNTER — Encounter (HOSPITAL_BASED_OUTPATIENT_CLINIC_OR_DEPARTMENT_OTHER): Payer: Medicare Other | Admitting: Oncology

## 2011-07-12 DIAGNOSIS — C787 Secondary malignant neoplasm of liver and intrahepatic bile duct: Secondary | ICD-10-CM

## 2011-07-12 DIAGNOSIS — C801 Malignant (primary) neoplasm, unspecified: Secondary | ICD-10-CM

## 2011-07-12 DIAGNOSIS — C7800 Secondary malignant neoplasm of unspecified lung: Secondary | ICD-10-CM

## 2011-07-12 DIAGNOSIS — I2699 Other pulmonary embolism without acute cor pulmonale: Secondary | ICD-10-CM

## 2011-07-12 DIAGNOSIS — C259 Malignant neoplasm of pancreas, unspecified: Secondary | ICD-10-CM

## 2011-07-12 NOTE — Telephone Encounter (Signed)
lmonvm regarding dr ha wanted to do an add on lab for this pt on 07/15/2011

## 2011-07-13 ENCOUNTER — Encounter: Payer: Medicare Other | Admitting: Oncology

## 2011-07-14 ENCOUNTER — Encounter: Payer: Self-pay | Admitting: *Deleted

## 2011-07-14 ENCOUNTER — Ambulatory Visit: Payer: Medicare Other

## 2011-07-15 ENCOUNTER — Encounter: Payer: Self-pay | Admitting: Oncology

## 2011-07-15 ENCOUNTER — Ambulatory Visit (HOSPITAL_BASED_OUTPATIENT_CLINIC_OR_DEPARTMENT_OTHER): Payer: Medicare Other

## 2011-07-15 ENCOUNTER — Other Ambulatory Visit: Payer: Self-pay | Admitting: Oncology

## 2011-07-15 ENCOUNTER — Ambulatory Visit: Payer: Medicare Other

## 2011-07-15 ENCOUNTER — Encounter: Payer: Self-pay | Admitting: Certified Registered Nurse Anesthetist

## 2011-07-15 ENCOUNTER — Other Ambulatory Visit (HOSPITAL_BASED_OUTPATIENT_CLINIC_OR_DEPARTMENT_OTHER): Payer: Medicare Other | Admitting: Lab

## 2011-07-15 DIAGNOSIS — E860 Dehydration: Secondary | ICD-10-CM

## 2011-07-15 DIAGNOSIS — I2699 Other pulmonary embolism without acute cor pulmonale: Secondary | ICD-10-CM

## 2011-07-15 DIAGNOSIS — C259 Malignant neoplasm of pancreas, unspecified: Secondary | ICD-10-CM

## 2011-07-15 DIAGNOSIS — C250 Malignant neoplasm of head of pancreas: Secondary | ICD-10-CM

## 2011-07-15 LAB — BASIC METABOLIC PANEL
CO2: 24 mEq/L (ref 19–32)
Chloride: 104 mEq/L (ref 96–112)
Glucose, Bld: 139 mg/dL — ABNORMAL HIGH (ref 70–99)
Potassium: 3.4 mEq/L — ABNORMAL LOW (ref 3.5–5.3)
Sodium: 141 mEq/L (ref 135–145)

## 2011-07-15 MED ORDER — HEPARIN SOD (PORK) LOCK FLUSH 100 UNIT/ML IV SOLN
500.0000 [IU] | Freq: Once | INTRAVENOUS | Status: AC | PRN
  Administered 2011-07-15: 500 [IU]
  Filled 2011-07-15: qty 5

## 2011-07-15 MED ORDER — SODIUM CHLORIDE 0.9 % IJ SOLN
10.0000 mL | INTRAMUSCULAR | Status: DC | PRN
  Administered 2011-07-15: 10 mL
  Filled 2011-07-15: qty 10

## 2011-07-15 MED ORDER — ENOXAPARIN SODIUM 120 MG/0.8ML ~~LOC~~ SOLN
120.0000 mg | SUBCUTANEOUS | Status: DC
  Administered 2011-07-15: 120 mg via SUBCUTANEOUS
  Filled 2011-07-15: qty 0.8

## 2011-07-15 MED ORDER — SODIUM CHLORIDE 0.9 % IV SOLN
INTRAVENOUS | Status: AC
  Administered 2011-07-15: 17:00:00 via INTRAVENOUS

## 2011-07-15 NOTE — Progress Notes (Deleted)
Weight has declined significantly to 172.3 pounds from 189.0 pounds on 10/16.  The patient reports that he continues to try to eat.  He has continued Glucerna without difficulty.  NUTRITION DIAGNOSIS:  Unintended weight loss continues.  INTERVENTION:  I have asked the patient to change to an Ensure Plus or Boost Plus product from the Glucerna so that he can obtain a few more calories and protein.  We have discussed the importance of increased calories to minimize any further weight loss.  The patient reports understanding.  MONITORING, EVALUATION AND GOALS:  The patient has been unable to tolerate an oral diet to minimize weight loss.  He will continue to work to increase his oral intake.  NEXT VISIT:  Tuesday, 11/13, during chemotherapy.    ______________________________ Quintasha Gren, RD, LDN Clinical Nutrition Specialist BN/MEDQ  D:  07/15/2011  T:  07/15/2011  Job:  [id number] 

## 2011-07-15 NOTE — Progress Notes (Signed)
Infusing fluids at 750 per hour to be finished around 6pm.  Okay per Dr. Gaylyn Rong.

## 2011-07-16 ENCOUNTER — Encounter: Payer: Self-pay | Admitting: Certified Registered Nurse Anesthetist

## 2011-07-16 ENCOUNTER — Ambulatory Visit (HOSPITAL_BASED_OUTPATIENT_CLINIC_OR_DEPARTMENT_OTHER): Payer: Medicare Other

## 2011-07-16 VITALS — BP 138/70 | HR 88 | Temp 97.5°F

## 2011-07-16 DIAGNOSIS — I2699 Other pulmonary embolism without acute cor pulmonale: Secondary | ICD-10-CM

## 2011-07-16 MED ORDER — ENOXAPARIN SODIUM 120 MG/0.8ML ~~LOC~~ SOLN
120.0000 mg | SUBCUTANEOUS | Status: DC
  Administered 2011-07-16: 120 mg via SUBCUTANEOUS
  Filled 2011-07-16: qty 0.8

## 2011-07-17 ENCOUNTER — Ambulatory Visit: Payer: Medicare Other

## 2011-07-17 ENCOUNTER — Ambulatory Visit (HOSPITAL_BASED_OUTPATIENT_CLINIC_OR_DEPARTMENT_OTHER): Payer: Medicare Other

## 2011-07-17 VITALS — BP 139/69 | HR 60 | Temp 97.2°F

## 2011-07-17 DIAGNOSIS — C259 Malignant neoplasm of pancreas, unspecified: Secondary | ICD-10-CM

## 2011-07-17 DIAGNOSIS — E860 Dehydration: Secondary | ICD-10-CM

## 2011-07-17 DIAGNOSIS — I2699 Other pulmonary embolism without acute cor pulmonale: Secondary | ICD-10-CM

## 2011-07-17 MED ORDER — SODIUM CHLORIDE 0.9 % IV SOLN
INTRAVENOUS | Status: AC
  Administered 2011-07-17: 16:00:00 via INTRAVENOUS

## 2011-07-17 MED ORDER — ENOXAPARIN SODIUM 120 MG/0.8ML ~~LOC~~ SOLN
120.0000 mg | SUBCUTANEOUS | Status: DC
  Administered 2011-07-17: 120 mg via SUBCUTANEOUS
  Filled 2011-07-17: qty 0.8

## 2011-07-18 ENCOUNTER — Ambulatory Visit (HOSPITAL_BASED_OUTPATIENT_CLINIC_OR_DEPARTMENT_OTHER): Payer: Medicare Other

## 2011-07-18 VITALS — BP 122/69 | HR 85 | Temp 97.3°F

## 2011-07-18 DIAGNOSIS — I2699 Other pulmonary embolism without acute cor pulmonale: Secondary | ICD-10-CM

## 2011-07-18 MED ORDER — ENOXAPARIN SODIUM 120 MG/0.8ML ~~LOC~~ SOLN
120.0000 mg | SUBCUTANEOUS | Status: DC
  Administered 2011-07-18: 120 mg via SUBCUTANEOUS

## 2011-07-19 ENCOUNTER — Other Ambulatory Visit: Payer: Self-pay | Admitting: Oncology

## 2011-07-19 ENCOUNTER — Other Ambulatory Visit: Payer: Medicare Other | Admitting: Lab

## 2011-07-19 ENCOUNTER — Ambulatory Visit (HOSPITAL_BASED_OUTPATIENT_CLINIC_OR_DEPARTMENT_OTHER): Payer: Medicare Other

## 2011-07-19 DIAGNOSIS — C259 Malignant neoplasm of pancreas, unspecified: Secondary | ICD-10-CM

## 2011-07-19 DIAGNOSIS — E860 Dehydration: Secondary | ICD-10-CM

## 2011-07-19 DIAGNOSIS — I2699 Other pulmonary embolism without acute cor pulmonale: Secondary | ICD-10-CM

## 2011-07-19 LAB — BASIC METABOLIC PANEL
Calcium: 9.1 mg/dL (ref 8.4–10.5)
Creatinine, Ser: 1.15 mg/dL (ref 0.50–1.35)
Glucose, Bld: 127 mg/dL — ABNORMAL HIGH (ref 70–99)
Sodium: 143 mEq/L (ref 135–145)

## 2011-07-19 MED ORDER — SODIUM CHLORIDE 0.9 % IV SOLN
INTRAVENOUS | Status: AC
  Administered 2011-07-19: 16:00:00 via INTRAVENOUS

## 2011-07-19 MED ORDER — ENOXAPARIN SODIUM 120 MG/0.8ML ~~LOC~~ SOLN
120.0000 mg | SUBCUTANEOUS | Status: DC
  Administered 2011-07-19: 120 mg via SUBCUTANEOUS
  Filled 2011-07-19: qty 0.8

## 2011-07-19 NOTE — Progress Notes (Deleted)
Weight has declined significantly to 172.3 pounds from 189.0 pounds on 10/16.  The patient reports that he continues to try to eat.  He has continued Glucerna without difficulty.  NUTRITION DIAGNOSIS:  Unintended weight loss continues.  INTERVENTION:  I have asked the patient to change to an Ensure Plus or Boost Plus product from the Glucerna so that he can obtain a few more calories and protein.  We have discussed the importance of increased calories to minimize any further weight loss.  The patient reports understanding.  MONITORING, EVALUATION AND GOALS:  The patient has been unable to tolerate an oral diet to minimize weight loss.  He will continue to work to increase his oral intake.  NEXT VISIT:  Tuesday, 11/13, during chemotherapy.    ______________________________ Jhoanna Heyde, RD, LDN Clinical Nutrition Specialist BN/MEDQ  D:  07/15/2011  T:  07/15/2011  Job:  [id number] 

## 2011-07-20 ENCOUNTER — Ambulatory Visit (HOSPITAL_BASED_OUTPATIENT_CLINIC_OR_DEPARTMENT_OTHER): Payer: Medicare Other

## 2011-07-20 VITALS — BP 126/72 | HR 98 | Temp 97.0°F

## 2011-07-20 DIAGNOSIS — I2699 Other pulmonary embolism without acute cor pulmonale: Secondary | ICD-10-CM

## 2011-07-20 MED ORDER — ENOXAPARIN SODIUM 120 MG/0.8ML ~~LOC~~ SOLN
120.0000 mg | SUBCUTANEOUS | Status: DC
  Administered 2011-07-20: 120 mg via SUBCUTANEOUS

## 2011-07-21 ENCOUNTER — Ambulatory Visit: Payer: Medicare Other

## 2011-07-22 ENCOUNTER — Telehealth: Payer: Self-pay | Admitting: *Deleted

## 2011-07-22 ENCOUNTER — Ambulatory Visit: Payer: Medicare Other

## 2011-07-22 ENCOUNTER — Other Ambulatory Visit: Payer: Self-pay

## 2011-07-22 ENCOUNTER — Emergency Department (HOSPITAL_COMMUNITY): Payer: Medicare Other

## 2011-07-22 ENCOUNTER — Encounter (HOSPITAL_COMMUNITY): Payer: Self-pay | Admitting: *Deleted

## 2011-07-22 ENCOUNTER — Inpatient Hospital Stay (HOSPITAL_COMMUNITY)
Admission: EM | Admit: 2011-07-22 | Discharge: 2011-07-25 | DRG: 808 | Disposition: A | Discharge status: Home / Self Care | Payer: Medicare Other | Attending: Family Medicine | Admitting: Family Medicine

## 2011-07-22 DIAGNOSIS — N400 Enlarged prostate without lower urinary tract symptoms: Secondary | ICD-10-CM | POA: Diagnosis present

## 2011-07-22 DIAGNOSIS — D709 Neutropenia, unspecified: Principal | ICD-10-CM

## 2011-07-22 DIAGNOSIS — Z7901 Long term (current) use of anticoagulants: Secondary | ICD-10-CM

## 2011-07-22 DIAGNOSIS — N390 Urinary tract infection, site not specified: Secondary | ICD-10-CM | POA: Diagnosis present

## 2011-07-22 DIAGNOSIS — I959 Hypotension, unspecified: Secondary | ICD-10-CM | POA: Diagnosis present

## 2011-07-22 DIAGNOSIS — E876 Hypokalemia: Secondary | ICD-10-CM | POA: Diagnosis present

## 2011-07-22 DIAGNOSIS — R5081 Fever presenting with conditions classified elsewhere: Secondary | ICD-10-CM

## 2011-07-22 DIAGNOSIS — E46 Unspecified protein-calorie malnutrition: Secondary | ICD-10-CM | POA: Diagnosis present

## 2011-07-22 DIAGNOSIS — A419 Sepsis, unspecified organism: Secondary | ICD-10-CM | POA: Diagnosis present

## 2011-07-22 DIAGNOSIS — N179 Acute kidney failure, unspecified: Secondary | ICD-10-CM | POA: Diagnosis present

## 2011-07-22 DIAGNOSIS — D61818 Other pancytopenia: Secondary | ICD-10-CM

## 2011-07-22 DIAGNOSIS — K831 Obstruction of bile duct: Secondary | ICD-10-CM | POA: Diagnosis present

## 2011-07-22 DIAGNOSIS — K838 Other specified diseases of biliary tract: Secondary | ICD-10-CM | POA: Diagnosis present

## 2011-07-22 DIAGNOSIS — C253 Malignant neoplasm of pancreatic duct: Secondary | ICD-10-CM

## 2011-07-22 DIAGNOSIS — D61810 Antineoplastic chemotherapy induced pancytopenia: Secondary | ICD-10-CM | POA: Diagnosis present

## 2011-07-22 DIAGNOSIS — C7800 Secondary malignant neoplasm of unspecified lung: Secondary | ICD-10-CM | POA: Diagnosis present

## 2011-07-22 DIAGNOSIS — C787 Secondary malignant neoplasm of liver and intrahepatic bile duct: Secondary | ICD-10-CM | POA: Diagnosis present

## 2011-07-22 DIAGNOSIS — T451X5A Adverse effect of antineoplastic and immunosuppressive drugs, initial encounter: Secondary | ICD-10-CM | POA: Diagnosis present

## 2011-07-22 DIAGNOSIS — E119 Type 2 diabetes mellitus without complications: Secondary | ICD-10-CM | POA: Diagnosis present

## 2011-07-22 DIAGNOSIS — E860 Dehydration: Secondary | ICD-10-CM | POA: Diagnosis present

## 2011-07-22 DIAGNOSIS — I2699 Other pulmonary embolism without acute cor pulmonale: Secondary | ICD-10-CM | POA: Diagnosis present

## 2011-07-22 DIAGNOSIS — I10 Essential (primary) hypertension: Secondary | ICD-10-CM | POA: Diagnosis not present

## 2011-07-22 DIAGNOSIS — Z9221 Personal history of antineoplastic chemotherapy: Secondary | ICD-10-CM

## 2011-07-22 DIAGNOSIS — C259 Malignant neoplasm of pancreas, unspecified: Secondary | ICD-10-CM | POA: Diagnosis present

## 2011-07-22 HISTORY — DX: Unspecified protein-calorie malnutrition: E46

## 2011-07-22 HISTORY — DX: Urinary tract infection, site not specified: N39.0

## 2011-07-22 LAB — COMPREHENSIVE METABOLIC PANEL
AST: 51 U/L — ABNORMAL HIGH (ref 0–37)
BUN: 18 mg/dL (ref 6–23)
CO2: 20 mEq/L (ref 19–32)
Calcium: 8.7 mg/dL (ref 8.4–10.5)
Creatinine, Ser: 1.52 mg/dL — ABNORMAL HIGH (ref 0.50–1.35)
GFR calc Af Amer: 52 mL/min — ABNORMAL LOW (ref >90)
GFR calc non Af Amer: 45 mL/min — ABNORMAL LOW (ref >90)
Glucose, Bld: 119 mg/dL — ABNORMAL HIGH (ref 70–99)

## 2011-07-22 LAB — URINE MICROSCOPIC-ADD ON

## 2011-07-22 LAB — URINALYSIS, ROUTINE W REFLEX MICROSCOPIC
Nitrite: POSITIVE — AB
Protein, ur: 30 mg/dL — AB
Specific Gravity, Urine: 1.02 (ref 1.005–1.030)
Urobilinogen, UA: 0.2 mg/dL (ref 0.0–1.0)

## 2011-07-22 LAB — DIFFERENTIAL
Band Neutrophils: 0 % (ref 0–10)
Eosinophils Absolute: 0 10*3/uL (ref 0.0–0.7)
Lymphocytes Relative: 0 % — ABNORMAL LOW (ref 12–46)
Lymphs Abs: 0 10*3/uL — ABNORMAL LOW (ref 0.7–4.0)
Monocytes Absolute: 0 10*3/uL — ABNORMAL LOW (ref 0.1–1.0)

## 2011-07-22 LAB — PROCALCITONIN: Procalcitonin: 13.98 ng/mL

## 2011-07-22 LAB — LACTIC ACID, PLASMA: Lactic Acid, Venous: 2.9 mmol/L — ABNORMAL HIGH (ref 0.5–2.2)

## 2011-07-22 LAB — PROTIME-INR: INR: 1.81 — ABNORMAL HIGH (ref 0.00–1.49)

## 2011-07-22 LAB — CBC
HCT: 28.4 % — ABNORMAL LOW (ref 39.0–52.0)
MCHC: 32.7 g/dL (ref 30.0–36.0)
RDW: 15.6 % — ABNORMAL HIGH (ref 11.5–15.5)

## 2011-07-22 MED ORDER — PROCHLORPERAZINE MALEATE 10 MG PO TABS: 10.0000 mg | ORAL_TABLET | Freq: Four times a day (QID) | ORAL | Status: DC | PRN

## 2011-07-22 MED ORDER — INSULIN ASPART 100 UNIT/ML ~~LOC~~ SOLN
0.0000 [IU] | Freq: Three times a day (TID) | SUBCUTANEOUS | Status: DC
  Administered 2011-07-22 – 2011-07-25 (×3): 1 [IU] via SUBCUTANEOUS
  Filled 2011-07-22: qty 3

## 2011-07-22 MED ORDER — ACETAMINOPHEN 325 MG PO TABS
650.0000 mg | ORAL_TABLET | Freq: Once | ORAL | Status: AC
  Administered 2011-07-22: 650 mg via ORAL
  Filled 2011-07-22: qty 2

## 2011-07-22 MED ORDER — DRONABINOL 2.5 MG PO CAPS
2.5000 mg | ORAL_CAPSULE | Freq: Two times a day (BID) | ORAL | Status: DC
Start: 2011-07-23 — End: 2011-07-25
  Administered 2011-07-23 – 2011-07-25 (×5): 2.5 mg via ORAL
  Filled 2011-07-22 (×5): qty 1

## 2011-07-22 MED ORDER — IMIPENEM-CILASTATIN 500 MG IV SOLR
500.0000 mg | Freq: Once | INTRAVENOUS | Status: AC
  Administered 2011-07-22: 500 mg via INTRAVENOUS
  Filled 2011-07-22: qty 500

## 2011-07-22 MED ORDER — PANCRELIPASE (LIP-PROT-AMYL) 12000-38000 UNITS PO CPEP
2.0000 | ORAL_CAPSULE | Freq: Three times a day (TID) | ORAL | Status: DC
  Administered 2011-07-23 – 2011-07-25 (×9): 2 via ORAL
  Filled 2011-07-22 (×12): qty 2

## 2011-07-22 MED ORDER — LOPERAMIDE HCL 2 MG PO CAPS: 2.0000 mg | ORAL_CAPSULE | Freq: Four times a day (QID) | ORAL | Status: DC | PRN

## 2011-07-22 MED ORDER — FILGRASTIM 300 MCG/ML IJ SOLN
300.0000 ug | Freq: Every day | INTRAMUSCULAR | Status: DC
  Administered 2011-07-22 – 2011-07-24 (×3): 300 ug via SUBCUTANEOUS
  Filled 2011-07-22 (×6): qty 1

## 2011-07-22 MED ORDER — ONDANSETRON HCL 4 MG PO TABS: 4.0000 mg | ORAL_TABLET | Freq: Four times a day (QID) | ORAL | Status: DC | PRN

## 2011-07-22 MED ORDER — POTASSIUM CHLORIDE IN NACL 20-0.9 MEQ/L-% IV SOLN
INTRAVENOUS | Status: DC
  Administered 2011-07-22 – 2011-07-23 (×3): via INTRAVENOUS
  Administered 2011-07-23: 1000 mL via INTRAVENOUS
  Administered 2011-07-24 – 2011-07-25 (×3): via INTRAVENOUS
  Filled 2011-07-22 (×9): qty 1000

## 2011-07-22 MED ORDER — ASPIRIN 81 MG PO CHEW
81.0000 mg | CHEWABLE_TABLET | Freq: Every day | ORAL | Status: DC
  Administered 2011-07-22 – 2011-07-25 (×4): 81 mg via ORAL
  Filled 2011-07-22 (×5): qty 1

## 2011-07-22 MED ORDER — BACLOFEN 5 MG HALF TABLET
5.0000 mg | ORAL_TABLET | Freq: Three times a day (TID) | ORAL | Status: DC
  Administered 2011-07-22 – 2011-07-25 (×9): 5 mg via ORAL
  Filled 2011-07-22 (×14): qty 1

## 2011-07-22 MED ORDER — INSULIN ASPART 100 UNIT/ML ~~LOC~~ SOLN: 0.0000 [IU] | Freq: Every day | SUBCUTANEOUS | Status: DC

## 2011-07-22 MED ORDER — SODIUM CHLORIDE 0.9 % IV BOLUS (SEPSIS)
1000.0000 mL | Freq: Once | INTRAVENOUS | Status: AC
  Administered 2011-07-22 (×2): 1000 mL via INTRAVENOUS

## 2011-07-22 MED ORDER — ALUM & MAG HYDROXIDE-SIMETH 200-200-20 MG/5ML PO SUSP: 30.0000 mL | Freq: Four times a day (QID) | ORAL | Status: DC | PRN

## 2011-07-22 MED ORDER — LIDOCAINE-PRILOCAINE 2.5-2.5 % EX CREA: 1.0000 "application " | TOPICAL_CREAM | CUTANEOUS | Status: DC | PRN

## 2011-07-22 MED ORDER — OXYCODONE HCL 5 MG PO TABS: 5.0000 mg | ORAL_TABLET | ORAL | Status: DC | PRN

## 2011-07-22 MED ORDER — POLYETHYLENE GLYCOL 3350 17 G PO PACK
17.0000 g | PACK | Freq: Every day | ORAL | Status: DC | PRN
  Filled 2011-07-22 (×2): qty 1

## 2011-07-22 MED ORDER — HYDROCODONE-ACETAMINOPHEN 7.5-500 MG/15ML PO SOLN: 15.0000 mL | Freq: Four times a day (QID) | ORAL | Status: DC | PRN

## 2011-07-22 MED ORDER — ACETAMINOPHEN 325 MG PO TABS: 650.0000 mg | ORAL_TABLET | Freq: Four times a day (QID) | ORAL | Status: DC | PRN

## 2011-07-22 MED ORDER — PANTOPRAZOLE SODIUM 40 MG PO TBEC
40.0000 mg | DELAYED_RELEASE_TABLET | Freq: Every day | ORAL | Status: DC
  Administered 2011-07-22 – 2011-07-25 (×4): 40 mg via ORAL
  Filled 2011-07-22 (×5): qty 1

## 2011-07-22 MED ORDER — SUCRALFATE 1 G PO TABS
1.0000 g | ORAL_TABLET | Freq: Four times a day (QID) | ORAL | Status: DC
  Administered 2011-07-22 – 2011-07-25 (×12): 1 g via ORAL
  Filled 2011-07-22 (×19): qty 1

## 2011-07-22 MED ORDER — ACETAMINOPHEN 650 MG RE SUPP: 650.0000 mg | Freq: Four times a day (QID) | RECTAL | Status: DC | PRN

## 2011-07-22 MED ORDER — SODIUM CHLORIDE 0.9 % IV SOLN
INTRAVENOUS | Status: DC
  Administered 2011-07-22: 07:00:00 via INTRAVENOUS

## 2011-07-22 MED ORDER — DEXTROSE 5 % IV SOLN
1.0000 g | INTRAVENOUS | Status: DC
  Administered 2011-07-22 – 2011-07-23 (×2): 1 g via INTRAVENOUS
  Filled 2011-07-22 (×3): qty 10

## 2011-07-22 NOTE — H&P (Addendum)
Hospital Admission Note Date: 07/22/2011  Patient name: Mitchell Hines Medical record number: [id number] Date of birth: December 11, 1940 Age: 70 y.o. Gender: male   PCP: Dr. Miguel Rota, [facility] in [place] Oncologist: Dr. Jethro Bolus  Attending physician: Hillery Aldo, MD  Chief Complaint: Fever  History of Present Illness: Mitchell Hines is an 70 y.o. male the past medical history of pancreatic cancer. He is under the care of Dr. Gaylyn Rong for this. His last chemotherapy was on 07/12/2011. He presented to the emergency department with chief complaint of a two-day history of fevers and weakness. Patient denies any associated shortness of breath or cough. He does have a postnasal drainage and thick mucus in the back of his throat but no firm diabetes. He denies dysuria and hematuria. He notes that he has had a diminished appetite and has lost about 100 pounds over the past 6 months. There are no aggravating or alleviating factors. Upon initial evaluation in the emergency department, the patient was found to be febrile, hypotensive and neutropenic and subsequently was referred to the hospitalist service for further evaluation and treatment of a suspected urinary tract infection as the source of his current sepsis syndrome.  Past Medical History  Diagnosis Date  . Pancreas cancer 2012  . Cholestasis 2012  . Liver metastases 2012  . Lung metastases 2012  . BPH (benign prostatic hyperplasia) 2012    with Dr. Alfredo Martinez  . Pulmonary embolism 2012  . Weight loss, non-intentional 2012  . Poor appetite 2012  . GERD (gastroesophageal reflux disease) 06/29/2011  . Pancreatic insufficiency 06/29/2011  . Hypertension 06/29/2011  . Hemorrhoid 06/29/2011  . Diabetes mellitus   . Protein calorie malnutrition   . Neutropenia   . UTI (lower urinary tract infection)    Meds: Prior to Admission medications   Medication Sig Start Date End Date Taking? Authorizing Provider  amLODipine (NORVASC)  10 MG tablet Take 10 mg by mouth daily.     Yes Historical Provider, MD  aspirin 81 MG chewable tablet Chew 81 mg by mouth daily.     Yes Historical Provider, MD  baclofen (LIORESAL) 10 MG tablet Take 5 mg by mouth 3 (three) times daily.     Yes Historical Provider, MD  dronabinol (MARINOL) 2.5 MG capsule Take 2.5 mg by mouth 2 (two) times daily before a meal.     Yes Historical Provider, MD  enoxaparin (LOVENOX) 120 MG/0.8ML SOLN Inject 120 mg into the skin daily.     Yes Jethro Bolus, MD  HYDROcodone-acetaminophen (LORTAB) 7.5-500 MG/15ML solution Take 15 mLs by mouth every 6 (six) hours as needed.     Yes Jethro Bolus, MD  lidocaine-prilocaine (EMLA) cream Apply 1 application topically as needed. Apply to portacath site one hr prior to needlestick    Yes Historical Provider, MD  lipase/protease/amylase (CREON-10/PANCREASE) 12000 UNITS CPEP Take 2 capsules by mouth 3 (three) times daily before meals. Take 1 capsule before meals and if patient plans to eat a fatty food he will take 2 more capsules   Yes Historical Provider, MD  lisinopril (PRINIVIL,ZESTRIL) 40 MG tablet Take 40 mg by mouth daily.     Yes Historical Provider, MD  loperamide (IMODIUM) 2 MG capsule Take 2 mg by mouth 4 (four) times daily as needed.     Yes Historical Provider, MD  NON FORMULARY Chemotherapy is done here at the cancer center twice a month on Tuesday. Patient is scheduled to have treatment on this Tuesday coming up. Patient is  under the care of Dr. Jethro Bolus.   Yes Historical Provider, MD  omeprazole (PRILOSEC) 40 MG capsule Take 40 mg by mouth daily.     Yes Historical Provider, MD  ondansetron (ZOFRAN) 8 MG tablet Take 8 mg by mouth every 12 (twelve) hours as needed.     Yes Historical Provider, MD  prochlorperazine (COMPAZINE) 10 MG tablet Take 10 mg by mouth every 6 (six) hours as needed.     Yes Historical Provider, MD  promethazine (PHENERGAN) 25 MG suppository Place 25 mg rectally daily as needed.     Yes Jethro Bolus, MD    sitaGLIPtin (JANUVIA) 100 MG tablet Take 100 mg by mouth daily.     Yes Historical Provider, MD  sucralfate (CARAFATE) 1 G tablet Take 1 g by mouth 4 (four) times daily.     Yes Historical Provider, MD  sucralfate (CARAFATE) 1 GM/10ML suspension Take 1 g by mouth 4 (four) times daily. Patient only uses liquid when he is having trouble with swallowing tablets   Yes Historical Provider, MD  terazosin (HYTRIN) 5 MG capsule Take 5 mg by mouth daily.     Yes Historical Provider, MD  terazosin (HYTRIN) 5 MG capsule Take 5 mg by mouth at bedtime.      Historical Provider, MD   Allergies: Review of patient's allergies indicates no known allergies. History   Social History  . Marital Status: Married    Spouse Name: N/A    Number of Children: N/A  . Years of Education: N/A   Occupational History  . Systems analyst    Social History Main Topics  . Smoking status: Former Smoker -- 1.0 packs/day for 30 years    Types: Cigarettes  . Smokeless tobacco: Never Used  . Alcohol Use: No  . Drug Use: No  . Sexually Active: Not on file   Other Topics Concern  . Not on file   Social History Narrative   Mitchell Hines is married and lives in [place] with his wife.  He has 2 healthy children.  He is currently on medical leave from his job.   Family History  Problem Relation Age of Onset  . Cervical cancer Mother   . Diabetes type II Mother   . Stroke Father   . Pancreatic cancer Brother   . Coronary artery disease Brother    Past Surgical History  Procedure Date  . Portacath placement 06/10/11 [place]    Tip in lower SVC per Dr. Lowella Dandy  . Percutaneous external biliary drain placement 06/07/11    Review of Systems: Constitutional: Positive for fever and chills. Positive for weight loss, diminished appetite, and malaise. HEENT: Negative except for a postnasal drip. Cardiovascular: No chest pain or dysrhythmia. Respiratory: No shortness of breath or cough. GI: No nausea, vomiting, diarrhea,  or melena. He does have occasional mild hematochezia that he attributes to hemorrhoids. GU: No dysuria or hematuria. Musculoskeletal: Generalized malaise and myalgias. Neurologic: No headaches or focal neurologic deficits. He comprehensive 14 point review of systems was otherwise negative. Physical Exam: Blood pressure 101/45, pulse 86, temperature 99.8 F (37.7 C), temperature source Oral, resp. rate 17, SpO2 100.00%.  General appearance: Fatigued and no distress Head: Normocephalic, without obvious abnormality, atraumatic Eyes: conjunctivae/corneas clear. PERRL, EOM's intact. Fundi benign. Nose: Nares normal. Septum midline. Mucosa normal. No drainage or sinus tenderness. Throat: abnormal findings: Thick mucous in pharynx. Neck: no adenopathy, no carotid bruit, no JVD, supple, symmetrical, trachea midline and thyroid not enlarged, symmetric, no tenderness/mass/nodules  Lungs: clear to auscultation bilaterally Heart: regular rate and rhythm, S1, S2 normal, no murmur, click, rub or gallop Abdomen: soft, non-tender; bowel sounds normal; no masses,  no organomegaly Extremities: no edema, redness or tenderness in the calves or thighs Pulses: 2+ and symmetric Skin: Skin color, texture, turgor normal. No rashes or lesions Neurologic: Grossly normal  Lab results:  Results for orders placed during the hospital encounter of 07/22/11 (from the past 24 hour(s))  CBC     Status: Abnormal   Collection Time   07/22/11  4:01 AM      Component Value Range   WBC 0.8 (*) 4.0 - 10.5 (K/uL)   RBC 3.50 (*) 4.22 - 5.81 (MIL/uL)   Hemoglobin 9.3 (*) 13.0 - 17.0 (g/dL)   HCT 16.1 (*) 09.6 - 52.0 (%)   MCV 81.1  78.0 - 100.0 (fL)   MCH 26.6  26.0 - 34.0 (pg)   MCHC 32.7  30.0 - 36.0 (g/dL)   RDW 04.5 (*) 40.9 - 15.5 (%)   Platelets 61 (*) 150 - 400 (K/uL)  DIFFERENTIAL     Status: Abnormal   Collection Time   07/22/11  4:01 AM      Component Value Range   Neutrophils Relative 0 (*) 43 - 77 (%)    Lymphocytes Relative 0 (*) 12 - 46 (%)   Monocytes Relative 0 (*) 3 - 12 (%)   Eosinophils Relative 0  0 - 5 (%)   Basophils Relative 0  0 - 1 (%)   Band Neutrophils 0  0 - 10 (%)   nRBC 0  0 (/100 WBC)   Lymphs Abs 0.0 (*) 0.7 - 4.0 (K/uL)   Monocytes Absolute 0.0 (*) 0.1 - 1.0 (K/uL)   Eosinophils Absolute 0.0  0.0 - 0.7 (K/uL)   Basophils Absolute 0.0  0.0 - 0.1 (K/uL)   WBC Morphology TOO FEW TO COUNT, SMEAR AVAILABLE FOR REVIEW     Smear Review PLATELET COUNT CONFIRMED BY SMEAR    COMPREHENSIVE METABOLIC PANEL     Status: Abnormal   Collection Time   07/22/11  4:01 AM      Component Value Range   Sodium 141  135 - 145 (mEq/L)   Potassium 3.3 (*) 3.5 - 5.1 (mEq/L)   Chloride 108  96 - 112 (mEq/L)   CO2 20  19 - 32 (mEq/L)   Glucose, Bld 119 (*) 70 - 99 (mg/dL)   BUN 18  6 - 23 (mg/dL)   Creatinine, Ser 8.11 (*) 0.50 - 1.35 (mg/dL)   Calcium 8.7  8.4 - 91.4 (mg/dL)   Total Protein 6.3  6.0 - 8.3 (g/dL)   Albumin 2.6 (*) 3.5 - 5.2 (g/dL)   AST 51 (*) 0 - 37 (U/L)   ALT 88 (*) 0 - 53 (U/L)   Alkaline Phosphatase 95  39 - 117 (U/L)   Total Bilirubin 3.5 (*) 0.3 - 1.2 (mg/dL)   GFR calc non Af Amer 45 (*) >90 (mL/min)   GFR calc Af Amer 52 (*) >90 (mL/min)  PROTIME-INR     Status: Abnormal   Collection Time   07/22/11  4:01 AM      Component Value Range   Prothrombin Time 21.3 (*) 11.6 - 15.2 (seconds)   INR 1.81 (*) 0.00 - 1.49   LACTIC ACID, PLASMA     Status: Abnormal   Collection Time   07/22/11  4:01 AM      Component Value Range   Lactic  Acid, Venous 2.9 (*) 0.5 - 2.2 (mmol/L)  PROCALCITONIN     Status: Normal   Collection Time   07/22/11  4:01 AM      Component Value Range   Procalcitonin 13.98    URINALYSIS, ROUTINE W REFLEX MICROSCOPIC     Status: Abnormal   Collection Time   07/22/11  4:55 AM      Component Value Range   Color, Urine ORANGE (*) YELLOW    Appearance CLOUDY (*) CLEAR    Specific Gravity, Urine 1.020  1.005 - 1.030    pH 5.5  5.0 - 8.0     Glucose, UA 100 (*) NEGATIVE (mg/dL)   Hgb urine dipstick MODERATE (*) NEGATIVE    Bilirubin Urine MODERATE (*) NEGATIVE    Ketones, ur TRACE (*) NEGATIVE (mg/dL)   Protein, ur 30 (*) NEGATIVE (mg/dL)   Urobilinogen, UA 0.2  0.0 - 1.0 (mg/dL)   Nitrite POSITIVE (*) NEGATIVE    Leukocytes, UA SMALL (*) NEGATIVE   URINE MICROSCOPIC-ADD ON     Status: Abnormal   Collection Time   07/22/11  4:55 AM      Component Value Range   Squamous Epithelial / LPF RARE  RARE    WBC, UA 0-2  <3 (WBC/hpf)   RBC / HPF 0-2  <3 (RBC/hpf)   Bacteria, UA FEW (*) RARE    Urine-Other MUCOUS PRESENT     Imaging results:  Dg Chest Portable 1 View  07/22/2011  *RADIOLOGY REPORT*  Clinical Data: Fever.  PORTABLE CHEST - 1 VIEW  Comparison: CT of the chest performed 06/18/2011  Findings: The lungs are well aerated.  Minimal bibasilar opacities likely reflect atelectasis.  The known left basilar pulmonary nodule is not readily characterized on radiograph.  No pleural effusion or pneumothorax is seen.  The cardiomediastinal silhouette is normal in size.  A right-sided chest Mediport is noted ending at the distal SVC.  No acute osseous abnormalities are seen.  There is fusion along the coracoclavicular ligaments bilaterally.  Degenerative change is noted at the left acromioclavicular joint.  IMPRESSION: Minimal bibasilar airspace opacities likely reflect atelectasis; known small left basilar pulmonary nodule is not readily characterized on radiograph.  Original Report Authenticated By: Tonia Ghent, M.D.    Assessment & Plan: Principal Problem:  *Sepsis: We will admit the patient and treat his sepsis with Rocephin as his urinary tract appears to be the most likely source. We will obtain urine cultures and blood cultures. We will hydrate him and monitor him closely. Active Problems:  UTI (lower urinary tract infection): Again, we will treat him with Rocephin and obtain urine cultures.  Neutropenia: We will notify Dr. Gaylyn Rong  of the patient's admission and start him on Neupogen given his neutropenia induced by chemotherapy.  Pancytopenia: This is secondary to chemotherapy. His hemoglobin is stable. His platelet count is relatively stable. We'll monitor him for signs of bleeding.  Sepsis associated hypotension: We will hydrate him and monitor his blood pressure closely. Hold all antihypertensives for now.  Pancreas cancer: We'll notify Dr. Gaylyn Rong of the patient's admission.  Cholestasis: The patient had a percutaneous drain placed and his liver function studies are stable.  Pulmonary embolism: Will continue Lovenox per pharmacy dosing protocol.  Dehydration: Will hydrate and monitor his renal function closely.  Protein calorie malnutrition: Secondary malignancy. We'll obtain a dietitian consultation.  DM type 2 (diabetes mellitus, type 2): We will hold his oral hypoglycemic medications and cover him with sliding scale insulin while in  hospital.  Hypokalemia: Will place potassium in his IV fluids.  ARF (acute renal failure): Likely prerenal and secondary to his dehydration. The patient does have a history of BPH and therefore if his creatinine does not respond to IV fluids, will get a renal ultrasound.  Hypertension: The patient is currently hypotensive and therefore we will hold his oral medications. Prophylaxis: The patient is already on Lovenox which we will continue per pharmacy protocol.  Time Spent On Admission: One hour  Hillery Aldo, MD Pager [phone] 07/22/2011, 8:13 AM

## 2011-07-22 NOTE — Consult Note (Signed)
[facility] INPATIENT PROGRESS NOTE  Name: Mitchell Hines      MRN: [id number]    Location: 1309/1309-01  Date: 07/22/2011 Time:6:25 PM   DIAGNOSIS: Metastatic pancreatic adenocarcinoma with met to lungs, and liver.   CURRENT THERAPY:  started on FOLFOX q2wks on 06/25/2011.  Last cycle was given on 07/10/2011.    Subjective: Interval History:Mitchell Hines fever up to 101.75F pas 24 hours.  He has been doing well after 2 cycles of chemotherapy. He said that after the 2nd cycle of chemotherapy his performance status was improved compared to before chemotherapy was started. However for the past 24hours, he has been having fever up to and 101.75F. With fever he is been feeling more fatigued and spends a lot of time inside his house as supposedly outside the house. He denies any focal source of infection. He denies headache, visual changes, mucositis , shortness of breath, chest pain, palpitation, bleeding, diarrhea, dysuria,.   His abdominal pain that was found at the time of pancreas cancer diagnosis has resolved completely.  He does not need any pain med.    Objective: Vital signs in last 24 hours: Temp:  [98.3 F (36.8 C)-102.3 F (39.1 C)] 98.3 F (36.8 C) (11/12 1053) Pulse Rate:  [66-140] 67  (11/12 1550) Resp:  [17-21] 17  (11/12 0639) BP: (96-114)/(43-49) 112/49 mmHg (11/12 1550) SpO2:  [98 %-100 %] 100 % (11/12 1550)      PHYSICAL EXAM: ECOG performance status 1.  General:  well-nourished in no acute distress.  Eyes:  much less icterus.  ENT:  There were no oropharyngeal lesions.  Neck was without thyromegaly.  Lymphatics:  Negative cervical, supraclavicular or axillary adenopathy.  Respiratory: lungs were clear bilaterally without wheezing or crackles.  Cardiovascular:  Regular rate and rhythm, S1/S2, without murmur, rub or gallop.  There was no pedal edema.  GI:  abdomen was soft, flat, nontender, nondistended, without organomegaly.  His percutaneous biliary drainage tube  without purulent discharge or tenderness.  Muscoloskeletal:  no spinal tenderness of palpation of vertebral spine.  Skin exam was without echymosis, petichae.  Neuro exam was nonfocal.  Patient was able to get on and off exam table without assistance.  Gait was normal.  Patient was alerted and oriented.  Attention was good.   Language was appropriate.  Mood was normal without depression.  Speech was not pressured.  Thought content was not tangential.     Studies/Results: Results for orders placed during the hospital encounter of 07/22/11 (from the past 48 hour(s))  CBC     Status: Abnormal   Collection Time   07/22/11  4:01 AM      Component Value Range Comment   WBC 0.8 (*) 4.0 - 10.5 (K/uL)    RBC 3.50 (*) 4.22 - 5.81 (MIL/uL)    Hemoglobin 9.3 (*) 13.0 - 17.0 (g/dL)    HCT 40.9 (*) 81.1 - 52.0 (%)    MCV 81.1  78.0 - 100.0 (fL)    MCH 26.6  26.0 - 34.0 (pg)    MCHC 32.7  30.0 - 36.0 (g/dL)    RDW 91.4 (*) 78.2 - 15.5 (%)    Platelets 61 (*) 150 - 400 (K/uL)   DIFFERENTIAL     Status: Abnormal   Collection Time   07/22/11  4:01 AM      Component Value Range Comment   Neutrophils Relative 0 (*) 43 - 77 (%)    Lymphocytes Relative 0 (*) 12 - 46 (%)  Monocytes Relative 0 (*) 3 - 12 (%)    Eosinophils Relative 0  0 - 5 (%)    Basophils Relative 0  0 - 1 (%)    Band Neutrophils 0  0 - 10 (%)    nRBC 0  0 (/100 WBC)    Lymphs Abs 0.0 (*) 0.7 - 4.0 (K/uL)    Monocytes Absolute 0.0 (*) 0.1 - 1.0 (K/uL)    Eosinophils Absolute 0.0  0.0 - 0.7 (K/uL)    Basophils Absolute 0.0  0.0 - 0.1 (K/uL)    WBC Morphology TOO FEW TO COUNT, SMEAR AVAILABLE FOR REVIEW      Smear Review PLATELET COUNT CONFIRMED BY SMEAR     COMPREHENSIVE METABOLIC PANEL     Status: Abnormal   Collection Time   07/22/11  4:01 AM      Component Value Range Comment   Sodium 141  135 - 145 (mEq/L)    Potassium 3.3 (*) 3.5 - 5.1 (mEq/L)    Chloride 108  96 - 112 (mEq/L)    CO2 20  19 - 32 (mEq/L)    Glucose, Bld 119  (*) 70 - 99 (mg/dL)    BUN 18  6 - 23 (mg/dL)    Creatinine, Ser 1.61 (*) 0.50 - 1.35 (mg/dL)    Calcium 8.7  8.4 - 10.5 (mg/dL)    Total Protein 6.3  6.0 - 8.3 (g/dL)    Albumin 2.6 (*) 3.5 - 5.2 (g/dL)    AST 51 (*) 0 - 37 (U/L)    ALT 88 (*) 0 - 53 (U/L)    Alkaline Phosphatase 95  39 - 117 (U/L)    Total Bilirubin 3.5 (*) 0.3 - 1.2 (mg/dL)    GFR calc non Af Amer 45 (*) >90 (mL/min)    GFR calc Af Amer 52 (*) >90 (mL/min)   PROTIME-INR     Status: Abnormal   Collection Time   07/22/11  4:01 AM      Component Value Range Comment   Prothrombin Time 21.3 (*) 11.6 - 15.2 (seconds)    INR 1.81 (*) 0.00 - 1.49    LACTIC ACID, PLASMA     Status: Abnormal   Collection Time   07/22/11  4:01 AM      Component Value Range Comment   Lactic Acid, Venous 2.9 (*) 0.5 - 2.2 (mmol/L)   PROCALCITONIN     Status: Normal   Collection Time   07/22/11  4:01 AM      Component Value Range Comment   Procalcitonin 13.98     PATHOLOGIST SMEAR REVIEW     Status: Normal   Collection Time   07/22/11  4:01 AM      Component Value Range Comment   Tech Review Reviewed By Mitchell Hines, M.D.     URINALYSIS, ROUTINE W REFLEX MICROSCOPIC     Status: Abnormal   Collection Time   07/22/11  4:55 AM      Component Value Range Comment   Color, Urine ORANGE (*) YELLOW  BIOCHEMICALS MAY BE AFFECTED BY COLOR   Appearance CLOUDY (*) CLEAR     Specific Gravity, Urine 1.020  1.005 - 1.030     pH 5.5  5.0 - 8.0     Glucose, UA 100 (*) NEGATIVE (mg/dL)    Hgb urine dipstick MODERATE (*) NEGATIVE     Bilirubin Urine MODERATE (*) NEGATIVE     Ketones, ur TRACE (*) NEGATIVE (mg/dL)    Protein,  ur 30 (*) NEGATIVE (mg/dL)    Urobilinogen, UA 0.2  0.0 - 1.0 (mg/dL)    Nitrite POSITIVE (*) NEGATIVE     Leukocytes, UA SMALL (*) NEGATIVE    URINE MICROSCOPIC-ADD ON     Status: Abnormal   Collection Time   07/22/11  4:55 AM      Component Value Range Comment   Squamous Epithelial / LPF RARE  RARE     WBC, UA 0-2  <3  (WBC/hpf)    RBC / HPF 0-2  <3 (RBC/hpf)    Bacteria, UA FEW (*) RARE     Urine-Other MUCOUS PRESENT     GLUCOSE, CAPILLARY     Status: Abnormal   Collection Time   07/22/11  4:26 PM      Component Value Range Comment   Glucose-Capillary 131 (*) 70 - 99 (mg/dL)    Dg Chest Portable 1 View  07/22/2011  *RADIOLOGY REPORT*  Clinical Data: Fever.  PORTABLE CHEST - 1 VIEW  Comparison: CT of the chest performed 06/18/2011  Findings: The lungs are well aerated.  Minimal bibasilar opacities likely reflect atelectasis.  The known left basilar pulmonary nodule is not readily characterized on radiograph.  No pleural effusion or pneumothorax is seen.  The cardiomediastinal silhouette is normal in size.  A right-sided chest Mediport is noted ending at the distal SVC.  No acute osseous abnormalities are seen.  There is fusion along the coracoclavicular ligaments bilaterally.  Degenerative change is noted at the left acromioclavicular joint.  IMPRESSION: Minimal bibasilar airspace opacities likely reflect atelectasis; known small left basilar pulmonary nodule is not readily characterized on radiograph.  Original Report Authenticated By: Tonia Ghent, M.D.   ASSESSMENT AND PLAN:  1. Metastatic pancreatic cancer.  Status post 2 cycles of chemotherapy FOLFOX. The next cycle of chem would be dose attenuated because of this current admission for neutropenic fever.  I may consider Neulasta injection that the next cycle of chemotherapy to decrease the risk neutropenic infection.  2. Pancytopenia:  Secondary to recent chemotherapy less than 2 weeks ago. There is no bleeding. There is no need for blood transfusions until Hgb less than 7 or Plt of less than 10,000 or he is actively bleeding. I agree with Neupogen 300 g of daily to increase the ANC until it is more than 1.5.  3. Neutropenic fever, secondary to #1 and #2. I agree with intact chronic cough which would such as regimens as vancomycin and coverage of  gram-negative such as Zosyn, or cefepime or imipenem per pharmacy protocol. 4. 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    His Tbili hasn't improved enough to internalize his percutaneous tube at this time. Content to follow until Tbili is around 2.  5. Renal insufficiency secondary to dehydration.  I agree with emperic IVF.  6. Hypokalemia:  Secondary to recovering bone marrow. Recommend to follow with potassium level daily and administer IV potassium chloride as needed 7. History of pulmonary embolism, subsegmental.  This asymptomatic.  He is on Lovenox 120 mg subcu daily until we restage him from the cancer standpoint.  We will see what is going on with the pulmonary embolism as well at that time.     8. Calorie protein malnutrition.  This is secondary to the cancer and the chemotherapy.  He was on home Marinol.  9. Diabetes mellitus, type II.  He is on sitagliptin per PCP.   10. Nausea and vomiting prophylaxis.  He has Compazine, Zofran and Ativan  p.r.n.  11. Pancreatic insufficiency from pancreatic cancer.  He was on home Creon 1 capsule 3 times a day.  I advised him to increase it to 2 capsules if he eats fatty food. 12. Code status.  Full code for now.  We never had the good opportunity t to discuss this important issue. I will address this issue with him once he had a followup CT scan restaging after 4 cycles chemotherapy which will be about a month from now.  Thank you for your assistance in this patient.  I will see patient again after he is discharge from this admission hospital to start his third cycle of chemotherapy.  Please page  [phone] with question.

## 2011-07-22 NOTE — ED Notes (Signed)
Pt arrived via EMS after attempting to go to [facility] at home, pt states very weak, began running fever last night, denies cough, denies n/v/d. Pt very warm to touch. Pt alert, daughter at bedside. Urostomy and biliary drain intact, dressing clean and dry. Pt connected to CCM. IV est by EMS L AC, 500cc NS infused.

## 2011-07-22 NOTE — Progress Notes (Signed)
Weight has declined significantly to 172.3 pounds from 189.0 pounds on 10/16.  The patient reports that he continues to try to eat.  He has continued Glucerna without difficulty.  NUTRITION DIAGNOSIS:  Unintended weight loss continues.  INTERVENTION:  I have asked the patient to change to an Ensure Plus or Boost Plus product from the Glucerna so that he can obtain a few more calories and protein.  We have discussed the importance of increased calories to minimize any further weight loss.  The patient reports understanding.  MONITORING, EVALUATION AND GOALS:  The patient has been unable to tolerate an oral diet to minimize weight loss.  He will continue to work to increase his oral intake.  NEXT VISIT:  Tuesday, 11/13, during chemotherapy.    ______________________________ Zenovia Jarred, RD, LDN Clinical Nutrition Specialist BN/MEDQ  D:  07/15/2011  T:  07/15/2011  Job:  [id number]

## 2011-07-22 NOTE — ED Provider Notes (Signed)
History     CSN: [id number] Arrival date & time: 07/22/2011  3:11 AM   First MD Initiated Contact with Patient 07/22/11 [phone]      Chief Complaint  Patient presents with  . Fever  . Fatigue  . Hypotension    (Consider location/radiation/quality/duration/timing/severity/associated sxs/prior treatment) Patient is a 70 y.o. male presenting with fever. The history is provided by the patient and the spouse.  Fever Primary symptoms of the febrile illness include fever, fatigue and abdominal pain (he has had general achiness at the site where his biliary catheter is). Primary symptoms do not include cough, wheezing, shortness of breath, nausea or vomiting. The current episode started yesterday. This is a new problem. The problem has not changed since onset. The maximum temperature recorded prior to his arrival was unknown.  The fatigue began yesterday. The fatigue has been unchanged since its onset.  The abdominal pain began more than 2 days ago. The abdominal pain has been unchanged since its onset. The abdominal pain radiates to the RUQ. The abdominal pain is relieved by nothing.  Risk factors for febrile illness include implanted device and history of cancer.  his discomfort is debilitating. He had chemotherapy for pancreatic cancer about 2 weeks ago.  Past Medical History  Diagnosis Date  . Pancreas cancer 2012  . Cholestasis 2012  . Liver metastases 2012  . Lung metastases 2012  . BPH (benign prostatic hyperplasia) 2012    with Dr. Alfredo Martinez  . Pulmonary embolism 2012  . Weight loss, non-intentional 2012  . Poor appetite 2012  . GERD (gastroesophageal reflux disease) 06/29/2011  . Pancreatic insufficiency 06/29/2011  . Hypertension 06/29/2011  . Hemorrhoid 06/29/2011  . Diabetes mellitus   . Protein calorie malnutrition   . Neutropenia   . UTI (lower urinary tract infection)     Past Surgical History  Procedure Date  . Portacath placement 06/10/11 Powerport   Tip in lower SVC per Dr. Lowella Dandy  . Percutaneous external biliary drain placement 06/07/11    Family History  Problem Relation Age of Onset  . Cervical cancer Mother   . Diabetes type II Mother   . Stroke Father   . Pancreatic cancer Brother   . Coronary artery disease Brother     History  Substance Use Topics  . Smoking status: Former Smoker -- 1.0 packs/day for 30 years    Types: Cigarettes  . Smokeless tobacco: Never Used  . Alcohol Use: No      Review of Systems  Constitutional: Positive for fever and fatigue.  Respiratory: Negative for cough, shortness of breath and wheezing.   Gastrointestinal: Positive for abdominal pain (he has had general achiness at the site where his biliary catheter is). Negative for nausea and vomiting.  All other systems reviewed and are negative.    Allergies  Review of patient's allergies indicates no known allergies.  Home Medications   Current Outpatient Rx  Name Route Sig Dispense Refill  . AMLODIPINE BESYLATE 10 MG PO TABS Oral Take 10 mg by mouth daily.      . ASPIRIN 81 MG PO CHEW Oral Chew 81 mg by mouth daily.      Marland Kitchen BACLOFEN 10 MG PO TABS Oral Take 5 mg by mouth 3 (three) times daily.      . DRONABINOL 2.5 MG PO CAPS Oral Take 2.5 mg by mouth 2 (two) times daily before a meal.      . ENOXAPARIN SODIUM 120 MG/0.8ML [place] SOLN Subcutaneous Inject 120 mg  into the skin daily.      Marland Kitchen HYDROCODONE-ACETAMINOPHEN 7.5-500 MG/15ML PO SOLN Oral Take 15 mLs by mouth every 6 (six) hours as needed.      Marland Kitchen LIDOCAINE-PRILOCAINE 2.5-2.5 % EX CREA Topical Apply 1 application topically as needed. Apply to portacath site one hr prior to needlestick     . PANCRELIPASE (LIP-PROT-AMYL) 12000 UNITS PO CPEP Oral Take 2 capsules by mouth 3 (three) times daily before meals. Take 1 capsule before meals and if patient plans to eat a fatty food he will take 2 more capsules    . LISINOPRIL 40 MG PO TABS Oral Take 40 mg by mouth daily.      Marland Kitchen LOPERAMIDE HCL 2 MG PO  CAPS Oral Take 2 mg by mouth 4 (four) times daily as needed.      . NON FORMULARY  Chemotherapy is done here at the cancer center twice a month on Tuesday. Patient is scheduled to have treatment on this Tuesday coming up. Patient is under the care of Dr. Jethro Bolus.    Marland Kitchen OMEPRAZOLE 40 MG PO CPDR Oral Take 40 mg by mouth daily.      Marland Kitchen ONDANSETRON HCL 8 MG PO TABS Oral Take 8 mg by mouth every 12 (twelve) hours as needed.      Marland Kitchen PROCHLORPERAZINE MALEATE 10 MG PO TABS Oral Take 10 mg by mouth every 6 (six) hours as needed.      Marland Kitchen PROMETHAZINE HCL 25 MG RE SUPP Rectal Place 25 mg rectally daily as needed.      Marland Kitchen SITAGLIPTIN PHOSPHATE 100 MG PO TABS Oral Take 100 mg by mouth daily.      . SUCRALFATE 1 G PO TABS Oral Take 1 g by mouth 4 (four) times daily.      . SUCRALFATE 1 GM/10ML PO SUSP Oral Take 1 g by mouth 4 (four) times daily. Patient only uses liquid when he is having trouble with swallowing tablets    . TERAZOSIN HCL 5 MG PO CAPS Oral Take 5 mg by mouth daily.      Marland Kitchen TERAZOSIN HCL 5 MG PO CAPS Oral Take 5 mg by mouth at bedtime.        BP 101/45  Pulse 86  Temp(Src) 99.8 F (37.7 C) (Oral)  Resp 17  SpO2 100%  Physical Exam  Constitutional: He is oriented to person, place, and time. He appears well-developed and well-nourished.  HENT:  Head: Normocephalic and atraumatic.  Eyes: Conjunctivae and EOM are normal. Pupils are equal, round, and reactive to light.  Neck: Normal range of motion. Neck supple.  Cardiovascular: Regular rhythm.  Exam reveals no gallop and no friction rub.   No murmur heard. Pulmonary/Chest: Effort normal and breath sounds normal.  Abdominal: Soft. Bowel sounds are normal. There is tenderness (tenderness is in the right upper quadrant. There is a biliary catheter present and the insertion into the skin appears normal).  Genitourinary:       No costovertebral angle tenderness  Musculoskeletal: Normal range of motion. He exhibits no edema.  Neurological: He is  alert and oriented to person, place, and time. No cranial nerve deficit. Coordination normal.  Skin: Skin is warm and dry. No erythema.  Psychiatric: He has a normal mood and affect. His behavior is normal. Thought content normal.    ED Course  Procedures (including critical care time)   Date: 07/22/2011  Rate: 98  Rhythm: normal sinus rhythm  QRS Axis: normal  Intervals: normal  ST/T Wave abnormalities: possible Q waves inferiorly  Conduction Disutrbances:none  Narrative Interpretation:   Old EKG Reviewed: none available      CRITICAL CARE Performed by: Mancel Bale L   Total critical care time: 45  Critical care time was exclusive of separately billable procedures and treating other patients.  Critical care was necessary to treat or prevent imminent or life-threatening deterioration.  Critical care was time spent personally by me on the following activities: development of treatment plan with patient and/or surrogate as well as nursing, discussions with consultants, evaluation of patient's response to treatment, examination of patient, obtaining history from patient or surrogate, ordering and performing treatments and interventions, ordering and review of laboratory studies, ordering and review of radiographic studies, pulse oximetry and re-evaluation of patient's condition.  OTHER DATA REVIEWED: Nursing notes, vital signs, and past medical records reviewed.    DIAGNOSTIC STUDIES: Oxygen Saturation is 100% on room air, normal by my interpretation.    LABS / RADIOLOGY:   PROCEDURES:  ED COURSE / COORDINATION OF CARE:   MDM:   IMPRESSION: Diagnoses that have been ruled out:  Diagnoses that are still under consideration:  Final diagnoses:  Neutropenia  Urinary tract infection  UTI (lower urinary tract infection)  Protein calorie malnutrition    PLAN:   The patient is to return the emergency department if there is any worsening of symptoms. I have  reviewed the discharge instructions with the pt. And family  CONDITION ON DISCHARGE: Fair  MEDICATIONS GIVEN IN THE E.D.  Medications  doxycycline (VIBRAMYCIN) 100 MG capsule (not administered)  dronabinol (MARINOL) 2.5 MG capsule (not administered)  loperamide (IMODIUM) 2 MG capsule (not administered)  terazosin (HYTRIN) 1 MG capsule (not administered)  aspirin 81 MG chewable tablet (not administered)  baclofen (LIORESAL) 10 MG tablet (not administered)  terazosin (HYTRIN) 5 MG capsule (not administered)  sucralfate (CARAFATE) 1 G tablet (not administered)  NON FORMULARY (not administered)  0.9 % NaCl with KCl 20 mEq/ L  infusion (not administered)  acetaminophen (TYLENOL) tablet 650 mg (not administered)    Or  acetaminophen (TYLENOL) suppository 650 mg (not administered)  oxyCODONE (Oxy IR/ROXICODONE) immediate release tablet 5 mg (not administered)  polyethylene glycol (MIRALAX / GLYCOLAX) packet 17 g (not administered)  alum & mag hydroxide-simeth (MAALOX/MYLANTA) 200-200-20 MG/5ML suspension 30 mL (not administered)  filgrastim (NEUPOGEN) injection 300 mcg (not administered)  acetaminophen (TYLENOL) tablet 650 mg (650 mg Oral Given 07/22/11 0427)  sodium chloride 0.9 % bolus 1,000 mL (1000 mL Intravenous Given 07/22/11 0428)  imipenem-cilastatin (PRIMAXIN) 500 mg in sodium chloride 0.9 % 100 mL IVPB (500 mg Intravenous Given 07/22/11 0538)    DISCHARGE MEDICATIONS: New Prescriptions   No medications on file           Labs Reviewed  CBC - Abnormal; Notable for the following:    WBC 0.8 (*)    RBC 3.50 (*)    Hemoglobin 9.3 (*)    HCT 28.4 (*)    RDW 15.6 (*)    Platelets 61 (*)    All other components within normal limits  DIFFERENTIAL - Abnormal; Notable for the following:    Neutrophils Relative 0 (*)    Lymphocytes Relative 0 (*)    Monocytes Relative 0 (*)    Lymphs Abs 0.0 (*)    Monocytes Absolute 0.0 (*)    All other components within normal limits   COMPREHENSIVE METABOLIC PANEL - Abnormal; Notable for the following:    Potassium 3.3 (*)  Glucose, Bld 119 (*)    Creatinine, Ser 1.52 (*)    Albumin 2.6 (*)    AST 51 (*)    ALT 88 (*)    Total Bilirubin 3.5 (*)    GFR calc non Af Amer 45 (*)    GFR calc Af Amer 52 (*)    All other components within normal limits  URINALYSIS, ROUTINE W REFLEX MICROSCOPIC - Abnormal; Notable for the following:    Color, Urine ORANGE (*) BIOCHEMICALS MAY BE AFFECTED BY COLOR   Appearance CLOUDY (*)    Glucose, UA 100 (*)    Hgb urine dipstick MODERATE (*)    Bilirubin Urine MODERATE (*)    Ketones, ur TRACE (*)    Protein, ur 30 (*)    Nitrite POSITIVE (*)    Leukocytes, UA SMALL (*)    All other components within normal limits  PROTIME-INR - Abnormal; Notable for the following:    Prothrombin Time 21.3 (*)    INR 1.81 (*)    All other components within normal limits  LACTIC ACID, PLASMA - Abnormal; Notable for the following:    Lactic Acid, Venous 2.9 (*)    All other components within normal limits  URINE MICROSCOPIC-ADD ON - Abnormal; Notable for the following:    Bacteria, UA FEW (*)    All other components within normal limits  PROCALCITONIN  URINE CULTURE  CULTURE, BLOOD (ROUTINE X 2)  CULTURE, BLOOD (ROUTINE X 2)  PATHOLOGIST SMEAR REVIEW   Dg Chest Portable 1 View  07/22/2011  *RADIOLOGY REPORT*  Clinical Data: Fever.  PORTABLE CHEST - 1 VIEW  Comparison: CT of the chest performed 06/18/2011  Findings: The lungs are well aerated.  Minimal bibasilar opacities likely reflect atelectasis.  The known left basilar pulmonary nodule is not readily characterized on radiograph.  No pleural effusion or pneumothorax is seen.  The cardiomediastinal silhouette is normal in size.  A right-sided chest Mediport is noted ending at the distal SVC.  No acute osseous abnormalities are seen.  There is fusion along the coracoclavicular ligaments bilaterally.  Degenerative change is noted at the left  acromioclavicular joint.  IMPRESSION: Minimal bibasilar airspace opacities likely reflect atelectasis; known small left basilar pulmonary nodule is not readily characterized on radiograph.  Original Report Authenticated By: Tonia Ghent, M.D.     1. Neutropenia   2. Urinary tract infection   3. UTI (lower urinary tract infection)   4. Protein calorie malnutrition    5. Hypotension   MDM  Neutropenia due to chemotherapy. She'll need to be admitted to a monitored bed follow closely for her blood pressure.  And treatment of the infection.        Flint Melter, MD 07/22/11 [phone]

## 2011-07-22 NOTE — ED Notes (Signed)
Per EMS - pt from home - pt has been experiencing fever, generalized weakness, and "not feeling well" - pt denies n/v/d or pain. Pt hypotensive on EMS arrival 50 palpated, EMS administered fluids and repeat BP was 70 palpated. Pt A&Ox4

## 2011-07-23 ENCOUNTER — Ambulatory Visit: Payer: Medicare Other | Admitting: Oncology

## 2011-07-23 ENCOUNTER — Inpatient Hospital Stay: Payer: Medicare Other

## 2011-07-23 ENCOUNTER — Other Ambulatory Visit: Payer: Medicare Other | Admitting: Lab

## 2011-07-23 ENCOUNTER — Encounter: Payer: Medicare Other | Admitting: Nutrition

## 2011-07-23 LAB — BASIC METABOLIC PANEL
BUN: 19 mg/dL (ref 6–23)
CO2: 21 mEq/L (ref 19–32)
Chloride: 113 mEq/L — ABNORMAL HIGH (ref 96–112)
Creatinine, Ser: 1.23 mg/dL (ref 0.50–1.35)
Potassium: 3.4 mEq/L — ABNORMAL LOW (ref 3.5–5.1)

## 2011-07-23 LAB — GLUCOSE, CAPILLARY
Glucose-Capillary: 120 mg/dL — ABNORMAL HIGH (ref 70–99)
Glucose-Capillary: 95 mg/dL (ref 70–99)

## 2011-07-23 LAB — CBC
HCT: 27 % — ABNORMAL LOW (ref 39.0–52.0)
Hemoglobin: 8.8 g/dL — ABNORMAL LOW (ref 13.0–17.0)
MCV: 81.1 fL (ref 78.0–100.0)
RBC: 3.33 MIL/uL — ABNORMAL LOW (ref 4.22–5.81)
WBC: 2.7 10*3/uL — ABNORMAL LOW (ref 4.0–10.5)

## 2011-07-23 LAB — URINE CULTURE

## 2011-07-23 MED ORDER — ENOXAPARIN SODIUM 120 MG/0.8ML ~~LOC~~ SOLN
120.0000 mg | SUBCUTANEOUS | Status: DC
  Administered 2011-07-24 – 2011-07-25 (×2): 120 mg via SUBCUTANEOUS
  Filled 2011-07-23 (×6): qty 0.8

## 2011-07-23 MED ORDER — TERAZOSIN HCL 5 MG PO CAPS
5.0000 mg | ORAL_CAPSULE | Freq: Every day | ORAL | Status: DC
  Administered 2011-07-23 – 2011-07-24 (×2): 5 mg via ORAL
  Filled 2011-07-23 (×5): qty 1

## 2011-07-23 MED ORDER — ENSURE CLINICAL ST REVIGOR PO LIQD
237.0000 mL | Freq: Two times a day (BID) | ORAL | Status: DC
  Administered 2011-07-23: 237 mL via ORAL

## 2011-07-23 MED ORDER — TERAZOSIN HCL 5 MG PO CAPS
5.0000 mg | ORAL_CAPSULE | Freq: Every day | ORAL | Status: DC
  Filled 2011-07-23 (×2): qty 1

## 2011-07-23 NOTE — Progress Notes (Signed)
INITIAL ADULT NUTRITION ASSESSMENT Date: 07/23/2011   Time: 10:56 AM Reason for Assessment: MD consult  ASSESSMENT: Male 70 y.o.  Dx: Sepsis  Hx:  Past Medical History  Diagnosis Date  . Pancreas cancer 2012  . Cholestasis 2012  . Liver metastases 2012  . Lung metastases 2012  . BPH (benign prostatic hyperplasia) 2012    with Dr. Alfredo Martinez  . Pulmonary embolism 2012  . Weight loss, non-intentional 2012  . Poor appetite 2012  . GERD (gastroesophageal reflux disease) 06/29/2011  . Pancreatic insufficiency 06/29/2011  . Hypertension 06/29/2011  . Hemorrhoid 06/29/2011  . Protein calorie malnutrition   . Neutropenia   . UTI (lower urinary tract infection)   . Diabetes mellitus     oral   Related Meds:  Scheduled Meds:   . acetaminophen  650 mg Oral Once  . aspirin  81 mg Oral Daily  . baclofen  5 mg Oral TID  . cefTRIAXone (ROCEPHIN) IV  1 g Intravenous Q24H  . dronabinol  2.5 mg Oral BID AC  . enoxaparin  120 mg Subcutaneous Q24H  . filgrastim (NEUPOGEN) SQ  300 mcg Subcutaneous Daily  . insulin aspart  0-5 Units Subcutaneous QHS  . insulin aspart  0-9 Units Subcutaneous TID WC  . lipase/protease/amylase  2 capsule Oral TID AC  . pantoprazole  40 mg Oral Q1200  . sucralfate  1 g Oral QID  . terazosin  5 mg Oral Daily  . terazosin  5 mg Oral QHS   Continuous Infusions:   . 0.9 % NaCl with KCl 20 mEq / L 100 mL/hr at 07/23/11 0209   PRN Meds:.acetaminophen, acetaminophen, alum & mag hydroxide-simeth, HYDROcodone-acetaminophen, lidocaine-prilocaine, loperamide, ondansetron, oxyCODONE, polyethylene glycol, prochlorperazine  Ht: 6' (182.9 cm)  Wt: 175 lb 7.8 oz (79.6 kg)  Ideal Wt: 80.9kg % Ideal Wt: 98  Usual Wt: 125kg % Usual Wt: 78  Body mass index is 23.80 kg/(m^2).  Food/Nutrition Related Hx: Pt on chemotherapy PTA. Pt reports 100 pound unintentional weight loss since September 2012 r/t not eating and not having an appetite. Pt reports being on  Marinol for 2 weeks PTA, however denies any improvement in appetite yet. Pt reports trying to drink 2 Boost Plus/Ensure a day at home. Pt reports having difficulty swallowing solid foods for the past 2 months. Noted pt ate 90% of breakfast today and reports enjoying the food.   Labs:  CMP     Component Value Date/Time   NA 142 07/23/2011 0634   K 3.4* 07/23/2011 0634   CL 113* 07/23/2011 0634   CO2 21 07/23/2011 0634   GLUCOSE 96 07/23/2011 0634   BUN 19 07/23/2011 0634   CREATININE 1.23 07/23/2011 0634   CALCIUM 8.4 07/23/2011 0634   PROT 6.3 07/22/2011 0401   ALBUMIN 2.6* 07/22/2011 0401   AST 51* 07/22/2011 0401   ALT 88* 07/22/2011 0401   ALKPHOS 95 07/22/2011 0401   BILITOT 3.5* 07/22/2011 0401   GFRNONAA 58* 07/23/2011 0634   GFRAA 67* 07/23/2011 0634   CBG (last 3)   Basename 07/23/11 0729 07/22/11 2223 07/22/11 1626  GLUCAP 102* 142* 131*    Intake/Output Summary (Last 24 hours) at 07/23/11 1101 Last data filed at 07/23/11 0846  Gross per 24 hour  Intake 1616.67 ml  Output   1050 ml  Net 566.67 ml    Diet Order: Carb Control  IVF:    0.9 % NaCl with KCl 20 mEq / L Last Rate: 100 mL/hr at  07/23/11 0209    Estimated Nutritional Needs:   Kcal:2000-2400 Protein:95-120g Fluid:2-2.4L  NUTRITION DIAGNOSIS: -Predicted suboptimal energy intake (NI-1.6).  Status: Ongoing -Pt meets criteria for severe PCM of chronic illness AEB 36% weight loss in the past 2 months and <75% intake in the past couple of months r/t lack of appetite and problems swallowing per pt report.   RELATED TO: poor appetite  AS EVIDENCE BY: pt statement and history of significant weight loss  MONITORING/EVALUATION(Goals): Pt to consume >90% of meals and supplements.  EDUCATION NEEDS: -Education needs addressed. Educated pt on high calorie/protein meal, snack, and supplement options and encouraged pt to get on a meal schedule, eating/drinking high calorie/protein food every 4-6 hours. Pt  expressed understanding.   INTERVENTION: Encouraged increased intake. Will order favorite snacks per pt preference. Chocolate Ensure Clinical Strength BID. Recommend SLP evaluation to address pt's reported problems with swallowing solid foods. Will monitor.   Dietitian # [phone]  DOCUMENTATION CODES Per approved criteria  -Severe malnutrition in the context of chronic illness    Marshall Cork 07/23/2011, 10:56 AM

## 2011-07-23 NOTE — Progress Notes (Signed)
Interval History: Mr. Sensing is a 70 year old male who is undergoing treatment for pancreatic cancer, and he was admitted to the hospital on 07/22/2011 with neutropenic fever and suspected urinary tract infection as the underlying etiology. He was started on empiric ceftriaxone. Neupogen also was started to address his severe neutropenia. His oncologist has consult and and I have reviewed the consult note. He was essentially stable overnight.  ROS: Mr. Faul feels about the same. His energy level is low. His appetite is fair. Denies dyspnea, cough, chest pain, abdominal pain, and diarrhea. He denies dysuria.   Objective: Vital signs in last 24 hours: Temp:  [98.1 F (36.7 C)-99.1 F (37.3 C)] 99.1 F (37.3 C) (11/13 0615) Pulse Rate:  [54-67] 59  (11/13 0615) Resp:  [16-24] 16  (11/13 0615) BP: (106-115)/(43-54) 115/54 mmHg (11/13 0615) SpO2:  [100 %] 100 % (11/13 0615) Weight:  [79.6 kg (175 lb 7.8 oz)] 175 lb 7.8 oz (79.6 kg) (11/13 0615) Weight change:  Last BM Date: 07/22/11  Intake/Output from previous day: 11/12 0701 - 11/13 0700 In: 1376.7 [I.V.:1376.7] Out: 850 [Urine:325] Total I/O In: 240 [P.O.:240] Out: 200 [Drains:200]  CBG (last 3)   Basename 07/23/11 0729 07/22/11 2223 07/22/11 1626  GLUCAP 102* 142* 131*     Physical Exam:  Gen:  No acute distress. Cardiovascular:  Regular rate, and rhythm. No murmurs, rubs, or gallops. Respiratory: Clear to auscultation bilaterally. Gastrointestinal: Soft, nontender, nondistended with normal active bowel sounds. Extremities: No clubbing, edema, or cyanosis.   Lab Results: Results for orders placed during the hospital encounter of 07/22/11 (from the past 24 hour(s))  GLUCOSE, CAPILLARY     Status: Abnormal   Collection Time   07/22/11  4:26 PM      Component Value Range   Glucose-Capillary 131 (*) 70 - 99 (mg/dL)  GLUCOSE, CAPILLARY     Status: Abnormal   Collection Time   07/22/11 10:23 PM      Component Value  Range   Glucose-Capillary 142 (*) 70 - 99 (mg/dL)  CBC     Status: Abnormal   Collection Time   07/23/11  6:34 AM      Component Value Range   WBC 2.7 (*) 4.0 - 10.5 (K/uL)   RBC 3.33 (*) 4.22 - 5.81 (MIL/uL)   Hemoglobin 8.8 (*) 13.0 - 17.0 (g/dL)   HCT 16.1 (*) 09.6 - 52.0 (%)   MCV 81.1  78.0 - 100.0 (fL)   MCH 26.4  26.0 - 34.0 (pg)   MCHC 32.6  30.0 - 36.0 (g/dL)   RDW 04.5 (*) 40.9 - 15.5 (%)   Platelets 70 (*) 150 - 400 (K/uL)  BASIC METABOLIC PANEL     Status: Abnormal   Collection Time   07/23/11  6:34 AM      Component Value Range   Sodium 142  135 - 145 (mEq/L)   Potassium 3.4 (*) 3.5 - 5.1 (mEq/L)   Chloride 113 (*) 96 - 112 (mEq/L)   CO2 21  19 - 32 (mEq/L)   Glucose, Bld 96  70 - 99 (mg/dL)   BUN 19  6 - 23 (mg/dL)   Creatinine, Ser 8.11  0.50 - 1.35 (mg/dL)   Calcium 8.4  8.4 - 91.4 (mg/dL)   GFR calc non Af Amer 58 (*) >90 (mL/min)   GFR calc Af Amer 67 (*) >90 (mL/min)  GLUCOSE, CAPILLARY     Status: Abnormal   Collection Time   07/23/11  7:29 AM  Component Value Range   Glucose-Capillary 102 (*) 70 - 99 (mg/dL)   Recent Results (from the past 240 hour(s))  CULTURE, BLOOD (ROUTINE X 2)     Status: Normal (Preliminary result)   Collection Time   07/22/11  4:01 AM      Component Value Range Status Comment   Specimen Description BLOOD PORTACATH   Final    Special Requests BOTTLES DRAWN AEROBIC AND ANAEROBIC 5CC   Final    Setup Time [id number]   Final    Culture     Final    Value:        BLOOD CULTURE RECEIVED NO GROWTH TO DATE CULTURE WILL BE HELD FOR 5 DAYS BEFORE ISSUING A FINAL NEGATIVE REPORT   Report Status PENDING   Incomplete   PATHOLOGIST SMEAR REVIEW     Status: Normal   Collection Time   07/22/11  4:01 AM      Component Value Range Status Comment   Tech Review Reviewed By Havery Moros, M.D.   Final   CULTURE, BLOOD (ROUTINE X 2)     Status: Normal (Preliminary result)   Collection Time   07/22/11  4:22 AM      Component Value  Range Status Comment   Specimen Description BLOOD L HAND   Final    Special Requests BOTTLES DRAWN AEROBIC AND ANAEROBIC 5CC   Final    Setup Time [id number]   Final    Culture     Final    Value:        BLOOD CULTURE RECEIVED NO GROWTH TO DATE CULTURE WILL BE HELD FOR 5 DAYS BEFORE ISSUING A FINAL NEGATIVE REPORT   Report Status PENDING   Incomplete     Studies/Results: Dg Chest Portable 1 View  07/22/2011  *RADIOLOGY REPORT*  Clinical Data: Fever.  PORTABLE CHEST - 1 VIEW  Comparison: CT of the chest performed 06/18/2011  Findings: The lungs are well aerated.  Minimal bibasilar opacities likely reflect atelectasis.  The known left basilar pulmonary nodule is not readily characterized on radiograph.  No pleural effusion or pneumothorax is seen.  The cardiomediastinal silhouette is normal in size.  A right-sided chest Mediport is noted ending at the distal SVC.  No acute osseous abnormalities are seen.  There is fusion along the coracoclavicular ligaments bilaterally.  Degenerative change is noted at the left acromioclavicular joint.  IMPRESSION: Minimal bibasilar airspace opacities likely reflect atelectasis; known small left basilar pulmonary nodule is not readily characterized on radiograph.  Original Report Authenticated By: Tonia Ghent, M.D.    Medications: Scheduled Meds:   . acetaminophen  650 mg Oral Once  . aspirin  81 mg Oral Daily  . baclofen  5 mg Oral TID  . cefTRIAXone (ROCEPHIN) IV  1 g Intravenous Q24H  . dronabinol  2.5 mg Oral BID AC  . enoxaparin  120 mg Subcutaneous Q24H  . filgrastim (NEUPOGEN) SQ  300 mcg Subcutaneous Daily  . insulin aspart  0-5 Units Subcutaneous QHS  . insulin aspart  0-9 Units Subcutaneous TID WC  . lipase/protease/amylase  2 capsule Oral TID AC  . pantoprazole  40 mg Oral Q1200  . sucralfate  1 g Oral QID  . terazosin  5 mg Oral Daily  . terazosin  5 mg Oral QHS   Continuous Infusions:   . 0.9 % NaCl with KCl 20 mEq / L 100 mL/hr at  07/23/11 0209   PRN Meds:.acetaminophen, acetaminophen, alum & mag hydroxide-simeth, HYDROcodone-acetaminophen, lidocaine-prilocaine,  loperamide, ondansetron, oxyCODONE, polyethylene glycol, prochlorperazine  Assessment/Plan:  Principal Problem:  *Sepsis: The patient is empirically on Rocephin for treatment of suspected urinary tract infection. He is hemodynamically stable. I have reviewed Dr. Lodema Pilot notes and will touch base with him regarding the patient's current antibiotic regimen. Followup with urine and blood cultures. Active Problems:  UTI (lower urinary tract infection): Day #2 Rocephin, hemodynamically stable.  Neutropenia: Patient has been placed on Neupogen and he has had an increase in his white blood cells. The Neupogen can be discontinued when his absolute neutrophil count reaches 1.5.  Pancytopenia: The patient's blood counts are stable and his white blood cell count has risen with Neupogen treatment. He had a slight reduction in his hemoglobin overnight but would not transfuse unless his hemoglobin falls below 7 mg/dL. His platelet count is stable at 70, up from 61 yesterday.  Sepsis associated hypotension: Patient remains on IV fluids with his antihypertensives on hold. His blood pressure stable.  Pancreas cancer: Patient was seen by his oncologist who agrees with the current plan of care. He can followup with his oncologist post discharge.  Cholestasis: The patient had a percutaneous drain placed and his liver function studies are stable.   Pulmonary embolism: Will continue Lovenox per pharmacy dosing protocol.   Dehydration: Continue IV fluids. His creatinine is improving. Protein calorie malnutrition: Secondary to malignancy. Requested a dietitian consultation.  DM type 2 (diabetes mellitus, type 2): We will hold his oral hypoglycemic medications and cover him with sliding scale insulin while in hospital. CBGs 102-142 over past 3 checks. Hypokalemia: We placed potassium in his IV  fluids, and it is slowly correcting.  ARF (acute renal failure): Likely prerenal and secondary to his dehydration. Improving with IVF.  Hypertension: The patient is currently normotensive and therefore we are continuing to hold his oral medications.  Prophylaxis: The patient is already on Lovenox which we will continue per pharmacy protocol.    LOS: 1 day   Hillery Aldo, MD Pager [phone]  07/23/2011, 9:01 AM

## 2011-07-23 NOTE — Progress Notes (Signed)
Pt coughed up a dime-sized amount of bloody sputum at 06:00 07/23/11. Will continue to monitor pt.

## 2011-07-23 NOTE — Progress Notes (Signed)
From bili drain

## 2011-07-24 ENCOUNTER — Other Ambulatory Visit: Payer: Self-pay | Admitting: Oncology

## 2011-07-24 ENCOUNTER — Ambulatory Visit: Payer: Medicare Other

## 2011-07-24 ENCOUNTER — Telehealth: Payer: Self-pay | Admitting: *Deleted

## 2011-07-24 DIAGNOSIS — C259 Malignant neoplasm of pancreas, unspecified: Secondary | ICD-10-CM

## 2011-07-24 DIAGNOSIS — D709 Neutropenia, unspecified: Secondary | ICD-10-CM | POA: Diagnosis present

## 2011-07-24 LAB — DIFFERENTIAL
Band Neutrophils: 13 % — ABNORMAL HIGH (ref 0–10)
Basophils Absolute: 0 10*3/uL (ref 0.0–0.1)
Basophils Relative: 0 % (ref 0–1)
Eosinophils Absolute: 0.1 10*3/uL (ref 0.0–0.7)
Lymphocytes Relative: 16 % (ref 12–46)
Lymphs Abs: 1.5 10*3/uL (ref 0.7–4.0)
Monocytes Relative: 9 % (ref 3–12)
Neutro Abs: 6.8 10*3/uL (ref 1.7–7.7)
Neutrophils Relative %: 58 % (ref 43–77)

## 2011-07-24 LAB — GLUCOSE, CAPILLARY
Glucose-Capillary: 88 mg/dL (ref 70–99)
Glucose-Capillary: 157 mg/dL — ABNORMAL HIGH (ref 70–99)

## 2011-07-24 LAB — CBC
MCH: 26.5 pg (ref 26.0–34.0)
MCHC: 32.7 g/dL (ref 30.0–36.0)
MCV: 80.9 fL (ref 78.0–100.0)
Platelets: 97 10*3/uL — ABNORMAL LOW (ref 150–400)
RBC: 3.25 MIL/uL — ABNORMAL LOW (ref 4.22–5.81)
RDW: 16.2 % — ABNORMAL HIGH (ref 11.5–15.5)

## 2011-07-24 LAB — EXPECTORATED SPUTUM ASSESSMENT W GRAM STAIN, RFLX TO RESP C

## 2011-07-24 LAB — BASIC METABOLIC PANEL
CO2: 22 mEq/L (ref 19–32)
Calcium: 8.6 mg/dL (ref 8.4–10.5)
GFR calc non Af Amer: 62 mL/min — ABNORMAL LOW (ref >90)
Glucose, Bld: 89 mg/dL (ref 70–99)
Potassium: 3.6 mEq/L (ref 3.5–5.1)
Sodium: 141 mEq/L (ref 135–145)

## 2011-07-24 MED ORDER — CIPROFLOXACIN HCL 500 MG PO TABS
500.0000 mg | ORAL_TABLET | Freq: Two times a day (BID) | ORAL | Status: DC
  Administered 2011-07-24 – 2011-07-25 (×2): 500 mg via ORAL
  Filled 2011-07-24 (×5): qty 1

## 2011-07-24 NOTE — Progress Notes (Signed)
[facility] progress note    Subjective: Doing well no other concerns today. Speech therapy is just work with him and has cleared him. States that he has more cough more phlegm in the morning and is producing greenish sputum.  No other complaints. Treatment Team:  Jethro Bolus, MD Objective: Vital signs in last 24 hours: Temp:  [97.3 F (36.3 C)-98.5 F (36.9 C)] 97.3 F (36.3 C) (11/14 1300) Pulse Rate:  [48-58] 58  (11/14 1300) Resp:  [16-18] 18  (11/14 1300) BP: (119-131)/(63-71) 131/71 mmHg (11/14 1300) SpO2:  [98 %-100 %] 98 % (11/14 1300) Weight change:   Intake/Output Summary (Last 24 hours) at 07/24/11 1545 Last data filed at 07/24/11 1500  Gross per 24 hour  Intake 2395.69 ml  Output   1100 ml  Net 1295.69 ml    General appearance: alert, cooperative and appears stated age Head: Normocephalic, without obvious abnormality, atraumatic Nose: Nares normal. Septum midline. Mucosa normal. No drainage or sinus tenderness. Throat: lips, mucosa, and tongue normal; teeth and gums normal Lungs: clear to auscultation bilaterally  Lab Results:  [facility] 07/24/11 0450 07/23/11 0634  NA 141 142  K 3.6 3.4*  CL 113* 113*  CO2 22 21  GLUCOSE 89 96  BUN 17 19  CREATININE 1.16 1.23  CALCIUM 8.6 8.4  MG -- --  PHOS -- --    Basename 07/22/11 0401  AST 51*  ALT 88*  ALKPHOS 95  BILITOT 3.5*  PROT 6.3  ALBUMIN 2.6*   No results found for this basename: LIPASE:2,AMYLASE:2 in the last 72 hours  Basename 07/24/11 0450 07/23/11 0634  WBC 9.2 2.7*  NEUTROABS 6.8 --  HGB 8.6* 8.8*  HCT 26.3* 27.0*  MCV 80.9 81.1  PLT 97* 70*   No results found for this basename: CKTOTAL:3,CKMB:3,CKMBINDEX:3,TROPONINI:3 in the last 72 hours No results found for this basename: POCBNP:3 in the last 72 hours No results found for this basename: DDIMER:2 in the last 72 hours No results found for this basename: HGBA1C:2 in the last 72 hours No results found for this basename:  CHOL:2,HDL:2,LDLCALC:2,TRIG:2,CHOLHDL:2,LDLDIRECT:2 in the last 72 hours No results found for this basename: TSH,T4TOTAL,FREET3,T3FREE,THYROIDAB in the last 72 hours No results found for this basename: VITAMINB12:2,FOLATE:2,FERRITIN:2,TIBC:2,IRON:2,RETICCTPCT:2 in the last 72 hours Micro Results: Recent Results (from the past 240 hour(s))  CULTURE, BLOOD (ROUTINE X 2)     Status: Normal (Preliminary result)   Collection Time   07/22/11  4:01 AM      Component Value Range Status Comment   Specimen Description BLOOD PORTACATH   Final    Special Requests BOTTLES DRAWN AEROBIC AND ANAEROBIC 5CC   Final    Setup Time [id number]   Final    Culture     Final    Value:        BLOOD CULTURE RECEIVED NO GROWTH TO DATE CULTURE WILL BE HELD FOR 5 DAYS BEFORE ISSUING A FINAL NEGATIVE REPORT   Report Status PENDING   Incomplete   PATHOLOGIST SMEAR REVIEW     Status: Normal   Collection Time   07/22/11  4:01 AM      Component Value Range Status Comment   Tech Review Reviewed By Havery Moros, M.D.   Final   CULTURE, BLOOD (ROUTINE X 2)     Status: Normal (Preliminary result)   Collection Time   07/22/11  4:22 AM      Component Value Range Status Comment   Specimen Description BLOOD L HAND   Final  Special Requests BOTTLES DRAWN AEROBIC AND ANAEROBIC 5CC   Final    Setup Time [id number]   Final    Culture     Final    Value:        BLOOD CULTURE RECEIVED NO GROWTH TO DATE CULTURE WILL BE HELD FOR 5 DAYS BEFORE ISSUING A FINAL NEGATIVE REPORT   Report Status PENDING   Incomplete   URINE CULTURE     Status: Normal   Collection Time   07/22/11  4:55 AM      Component Value Range Status Comment   Specimen Description URINE, RANDOM   Final    Special Requests NONE   Final    Setup Time [id number]   Final    Colony Count 8,000 COLONIES/ML   Final    Culture INSIGNIFICANT GROWTH   Final    Report Status 07/23/2011 FINAL   Final   CULTURE, SPUTUM-ASSESSMENT     Status: Normal   Collection  Time   07/23/11 11:42 PM      Component Value Range Status Comment   Specimen Description SPUTUM   Final    Special Requests Immunocompromised   Final    Sputum evaluation     Final    Value: MICROSCOPIC FINDINGS SUGGEST THAT THIS SPECIMEN IS NOT REPRESENTATIVE OF LOWER RESPIRATORY SECRETIONS. PLEASE RECOLLECT.     CALLED TO J DODSON RN AT 0140 07/24/2011 BY R LINEBERRY   Report Status 07/24/2011 FINAL   Final   CULTURE, SPUTUM-ASSESSMENT     Status: Normal   Collection Time   07/24/11  9:13 AM      Component Value Range Status Comment   Specimen Description SPUTUM   Final    Special Requests Immunocompromised   Final    Sputum evaluation     Final    Value: MICROSCOPIC FINDINGS SUGGEST THAT THIS SPECIMEN IS NOT REPRESENTATIVE OF LOWER RESPIRATORY SECRETIONS. PLEASE RECOLLECT.     [name] RN AT 0940 ON 07/24/11 BY GILLESPIE,B.   Report Status 07/24/2011 FINAL   Final    Studies/Results: Dg Chest Portable 1 View  07/22/2011  *RADIOLOGY REPORT*  Clinical Data: Fever.  PORTABLE CHEST - 1 VIEW  Comparison: CT of the chest performed 06/18/2011  Findings: The lungs are well aerated.  Minimal bibasilar opacities likely reflect atelectasis.  The known left basilar pulmonary nodule is not readily characterized on radiograph.  No pleural effusion or pneumothorax is seen.  The cardiomediastinal silhouette is normal in size.  A right-sided chest Mediport is noted ending at the distal SVC.  No acute osseous abnormalities are seen.  There is fusion along the coracoclavicular ligaments bilaterally.  Degenerative change is noted at the left acromioclavicular joint.  IMPRESSION: Minimal bibasilar airspace opacities likely reflect atelectasis; known small left basilar pulmonary nodule is not readily characterized on radiograph.  Original Report Authenticated By: Tonia Ghent, M.D.   Medications: I have reviewed the patient's current medications. Scheduled Meds:   . aspirin  81 mg Oral Daily  .  baclofen  5 mg Oral TID  . cefTRIAXone (ROCEPHIN) IV  1 g Intravenous Q24H  . dronabinol  2.5 mg Oral BID AC  . enoxaparin  120 mg Subcutaneous Q24H  . feeding supplement  237 mL Oral BID BM  . filgrastim (NEUPOGEN) SQ  300 mcg Subcutaneous Daily  . insulin aspart  0-5 Units Subcutaneous QHS  . insulin aspart  0-9 Units Subcutaneous TID WC  . lipase/protease/amylase  2 capsule Oral TID AC  .  pantoprazole  40 mg Oral Q1200  . sucralfate  1 g Oral QID  . terazosin  5 mg Oral QHS   Continuous Infusions:   . 0.9 % NaCl with KCl 20 mEq / L 100 mL/hr at 07/24/11 0934   PRN Meds:.acetaminophen, acetaminophen, alum & mag hydroxide-simeth, HYDROcodone-acetaminophen, lidocaine-prilocaine, loperamide, ondansetron, oxyCODONE, polyethylene glycol, prochlorperazine Medications Discontinued During This Encounter  Medication Reason  . doxycycline (VIBRAMYCIN) 100 MG capsule Error  . BACLOFEN PO Error  . terazosin (HYTRIN) 1 MG capsule Error  . 0.9 %  sodium chloride infusion   . enoxaparin (LOVENOX) injection 120 mg Duplicate  . terazosin (HYTRIN) 5 MG capsule Duplicate  . terazosin (HYTRIN) capsule 5 mg Duplicate   Assessment/Plan: Patient Active Hospital Problem List: Neutropenia with fever (07/24/2011)   Assessment: Per sheet Dr. Lodema Pilot input.  Note that patient has followup scheduled with him. Will DC IV antibiotics and change in transition to ciprofloxacin this patient afebrile for 24 hours and patient appears to be doing well. We'll collect sputum specimen in the morning.  We'll get a CBC in the morning to determine what his counts are with regards to his absolute neutrophil count. If all is stable in the morning will likely discharge home  Pancreas cancer ()   Assessment: Stage IV and per Dr. Gaylyn Rong.  Outpatient review     Pulmonary embolism ()   Assessment:  patient continues on empiric Lovenox at present time. This will continue per Dr. has not patient has been restricted from the cancer  standpoint.   Hypertension (06/29/2011)   Assessment: A. she was actually hypotensive earlier during admission.  He will cautiously reimplement a pressure meds as his blood pressure tolerates tomorrow     Dehydration (07/15/2011)   Assessment:  part and parcel of his sepsis syndrome. This has resolved    Neutropenia ()   Assessment: Neutrophil count has climbed from 0.8-->9.2 on Neulasta. Will empirically continue the same as an outpatient. His current platelet count is up to 97 from a level of 61 on admission     Protein calorie malnutrition ()   Assessment: Is on dronabinol and we'll continue the same     DM type 2 (diabetes mellitus, type 2) (07/22/2011)   Assessment: Well controlled    Plan: ARF (acute renal failure) (07/22/2011)   Assessment: Pending on admission was 1.52 and is currently 1.16   Pancytopenia (07/22/2011)   Assessment: Please see above discussions     Sepsis (07/22/2011)   Assessment:  resolved-we will is transition to ciprofloxacin and see the results tomorrow     LOS: 2 days   [facility] 07/24/2011, 3:45 PM

## 2011-07-24 NOTE — Progress Notes (Signed)
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  CURRENT THERAPY: started on FOLFOX q2wks on 06/25/2011. Last cycle was given on 07/10/2011.   Subjective: Interval History:Sly Rupe reported that he has not had any fever since admission.  He still feels a little weak and has not ambulated much except for around his room.  He denies headache, SOB, CP, abd pain ,bleeding symptoms, skin rash.   Objective: Vital signs in last 24 hours: Temp:  [97.3 F (36.3 C)-98.5 F (36.9 C)] 97.3 F (36.3 C) (11/14 1300) Pulse Rate:  [48-58] 58  (11/14 1300) Resp:  [16-18] 18  (11/14 1300) BP: (119-131)/(63-71) 131/71 mmHg (11/14 1300) SpO2:  [98 %-100 %] 98 % (11/14 1300)    Intake/Output from previous day: 11/13 0701 - 11/14 0700 In: 3525.7 [P.O.:480; I.V.:3045.7] Out: 1150 [Urine:400; Drains:750]    PHYSICAL EXAM: ECOG performance status 1. General: well-nourished in no acute distress. Eyes: much less icterus. ENT: There were no oropharyngeal lesions. Neck was without thyromegaly. Lymphatics: Negative cervical, supraclavicular or axillary adenopathy. Respiratory: lungs were clear bilaterally without wheezing or crackles. Cardiovascular: Regular rate and rhythm, S1/S2, without murmur, rub or gallop. There was no pedal edema. GI: abdomen was soft, flat, nontender, nondistended, without organomegaly. His percutaneous biliary drainage tube without purulent discharge or tenderness. Muscoloskeletal: no spinal tenderness of palpation of vertebral spine. Skin exam was without echymosis, petichae. Neuro exam was nonfocal. Patient was able to get on and off exam table without assistance. Gait was normal. Patient was alerted and oriented. Attention was good. Language was appropriate. Mood was normal without depression. Speech was not  pressured. Thought content was not tangential.     Studies/Results: Results for orders placed during the hospital encounter of 07/22/11 (from the past 48 hour(s))  GLUCOSE, CAPILLARY     Status: Abnormal   Collection Time   07/22/11 10:23 PM      Component Value Range Comment   Glucose-Capillary 142 (*) 70 - 99 (mg/dL)   CBC     Status: Abnormal   Collection Time   07/23/11  6:34 AM      Component Value Range Comment   WBC 2.7 (*) 4.0 - 10.5 (K/uL)    RBC 3.33 (*) 4.22 - 5.81 (MIL/uL)    Hemoglobin 8.8 (*) 13.0 - 17.0 (g/dL)    HCT 40.9 (*) 81.1 - 52.0 (%)    MCV 81.1  78.0 - 100.0 (fL)    MCH 26.4  26.0 - 34.0 (pg)    MCHC 32.6  30.0 - 36.0 (g/dL)    RDW 91.4 (*) 78.2 - 15.5 (%)    Platelets 70 (*) 150 - 400 (K/uL)   BASIC METABOLIC PANEL     Status: Abnormal   Collection Time   07/23/11  6:34 AM      Component Value Range Comment   Sodium 142  135 - 145 (mEq/L)    Potassium 3.4 (*) 3.5 - 5.1 (mEq/L)    Chloride 113 (*) 96 - 112 (mEq/L)    CO2 21  19 - 32 (mEq/L)    Glucose, Bld 96  70 - 99 (mg/dL)    BUN 19  6 - 23 (mg/dL)    Creatinine, Ser 9.56  0.50 - 1.35 (mg/dL)    Calcium 8.4  8.4 - 10.5 (mg/dL)  GFR calc non Af Amer 58 (*) >90 (mL/min)    GFR calc Af Amer 67 (*) >90 (mL/min)   GLUCOSE, CAPILLARY     Status: Abnormal   Collection Time   07/23/11  7:29 AM      Component Value Range Comment   Glucose-Capillary 102 (*) 70 - 99 (mg/dL)   GLUCOSE, CAPILLARY     Status: Abnormal   Collection Time   07/23/11 12:34 PM      Component Value Range Comment   Glucose-Capillary 120 (*) 70 - 99 (mg/dL)   GLUCOSE, CAPILLARY     Status: Normal   Collection Time   07/23/11  5:45 PM      Component Value Range Comment   Glucose-Capillary 95  70 - 99 (mg/dL)   GLUCOSE, CAPILLARY     Status: Abnormal   Collection Time   07/23/11 10:33 PM      Component Value Range Comment   Glucose-Capillary 111 (*) 70 - 99 (mg/dL)   CULTURE, SPUTUM-ASSESSMENT     Status: Normal    Collection Time   07/23/11 11:42 PM      Component Value Range Comment   Specimen Description SPUTUM      Special Requests Immunocompromised      Sputum evaluation        Value: MICROSCOPIC FINDINGS SUGGEST THAT THIS SPECIMEN IS NOT REPRESENTATIVE OF LOWER RESPIRATORY SECRETIONS. PLEASE RECOLLECT.     CALLED TO J DODSON RN AT 0140 07/24/2011 BY R LINEBERRY   Report Status 07/24/2011 FINAL     BASIC METABOLIC PANEL     Status: Abnormal   Collection Time   07/24/11  4:50 AM      Component Value Range Comment   Sodium 141  135 - 145 (mEq/L)    Potassium 3.6  3.5 - 5.1 (mEq/L)    Chloride 113 (*) 96 - 112 (mEq/L)    CO2 22  19 - 32 (mEq/L)    Glucose, Bld 89  70 - 99 (mg/dL)    BUN 17  6 - 23 (mg/dL)    Creatinine, Ser 9.60  0.50 - 1.35 (mg/dL)    Calcium 8.6  8.4 - 10.5 (mg/dL)    GFR calc non Af Amer 62 (*) >90 (mL/min)    GFR calc Af Amer 72 (*) >90 (mL/min)   DIFFERENTIAL     Status: Abnormal   Collection Time   07/24/11  4:50 AM      Component Value Range Comment   Neutrophils Relative 58  43 - 77 (%)    Lymphocytes Relative 16  12 - 46 (%)    Monocytes Relative 9  3 - 12 (%)    Eosinophils Relative 1  0 - 5 (%)    Basophils Relative 0  0 - 1 (%)    Band Neutrophils 13 (*) 0 - 10 (%)    Metamyelocytes Relative 1      Myelocytes 2      Neutro Abs 6.8  1.7 - 7.7 (K/uL)    Lymphs Abs 1.5  0.7 - 4.0 (K/uL)    Monocytes Absolute 0.8  0.1 - 1.0 (K/uL)    Eosinophils Absolute 0.1  0.0 - 0.7 (K/uL)    Basophils Absolute 0.0  0.0 - 0.1 (K/uL)    WBC Morphology MILD LEFT SHIFT (1-5% METAS, OCC MYELO, OCC BANDS)      Smear Review PLATELET COUNT CONFIRMED BY SMEAR     CBC     Status: Abnormal  Collection Time   07/24/11  4:50 AM      Component Value Range Comment   WBC 9.2  4.0 - 10.5 (K/uL)    RBC 3.25 (*) 4.22 - 5.81 (MIL/uL)    Hemoglobin 8.6 (*) 13.0 - 17.0 (g/dL)    HCT 91.4 (*) 78.2 - 52.0 (%)    MCV 80.9  78.0 - 100.0 (fL)    MCH 26.5  26.0 - 34.0 (pg)    MCHC 32.7   30.0 - 36.0 (g/dL)    RDW 95.6 (*) 21.3 - 15.5 (%)    Platelets 97 (*) 150 - 400 (K/uL)   GLUCOSE, CAPILLARY     Status: Normal   Collection Time   07/24/11  8:51 AM      Component Value Range Comment   Glucose-Capillary 88  70 - 99 (mg/dL)    Comment 1 Documented in Chart      Comment 2 Notify RN     CULTURE, SPUTUM-ASSESSMENT     Status: Normal   Collection Time   07/24/11  9:13 AM      Component Value Range Comment   Specimen Description SPUTUM      Special Requests Immunocompromised      Sputum evaluation        Value: MICROSCOPIC FINDINGS SUGGEST THAT THIS SPECIMEN IS NOT REPRESENTATIVE OF LOWER RESPIRATORY SECRETIONS. PLEASE RECOLLECT.     [name] RN AT 0940 ON 07/24/11 BY GILLESPIE,B.   Report Status 07/24/2011 FINAL     GLUCOSE, CAPILLARY     Status: Abnormal   Collection Time   07/24/11  1:16 PM      Component Value Range Comment   Glucose-Capillary 111 (*) 70 - 99 (mg/dL)    Comment 1 Documented in Chart      Comment 2 Notify RN     GLUCOSE, CAPILLARY     Status: Abnormal   Collection Time   07/24/11  5:31 PM      Component Value Range Comment   Glucose-Capillary 148 (*) 70 - 99 (mg/dL)    Comment 1 Documented in Chart      Comment 2 Notify RN      ASSESSMENT AND PLAN: 1. Metastatic pancreatic cancer. Status post 2 cycles of chemotherapy FOLFOX.  Chemo on hold due to neutropenic fever.  2. Pancytopenia: Secondary to recent chemotherapy less than 2 weeks ago. This is improving.  Since his ANC is now >1.5, I discontinued his Neupogen.  3. Neutropenic fever, secondary to #1 and #2. I agree with emperic switch from IV to PO antibiotic Cipro.  4. 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 His Tbili hasn't improved enough to internalize his percutaneous tube at this time. Content to follow until Tbili is around 2.  5. Renal insufficiency secondary to dehydration:  Cr now normal with IVF.  6. History of pulmonary embolism, subsegmental. This asymptomatic. He  is on Lovenox 120 mg subcu daily until we restage him from the cancer standpoint. We will see what is going on with the pulmonary embolism as well at that time.  7. Calorie protein malnutrition. This is secondary to the cancer and the chemotherapy. He was on home Marinol.  8. Diabetes mellitus, type II. He is on sitagliptin per PCP.  9. Nausea and vomiting prophylaxis. He has Compazine, Zofran and Ativan p.r.n.  10. Pancreatic insufficiency from pancreatic cancer. He was on home Creon 1 capsule 3 times a day. I advised him to increase it to 2 capsules if he  eats fatty food. 11. Code status. Full code for now. We never had the good opportunity t to discuss this important issue. I will address this issue with him once he had a followup CT scan restaging after 4 cycles chemotherapy which will be about a month from now.   Thank you for your management of this patient. If he continues to be afebrile tomorrow, may consider DC home with emperic oral Cipro.  He has appointment to see me at the [facility] and resume next cycle of chemo on 07/29/11.

## 2011-07-24 NOTE — Progress Notes (Signed)
Received a call from Dr. Mahala Menghini, patient will be d/ced on Lovenox daily until returns to see Dr. Gaylyn Rong. Spoke with patient, states he has been on Lovenox prior to admission and [facility] daily to have injections. This was arranged by Dr. Gaylyn Rong and per patient will resume at d/c. Patient lives at home with family, they provide assistance as needed, they provide transportation daily for injections. Patient states no other d/c needs at this time, appreciative of CM f/u. Will continue to follow.

## 2011-07-24 NOTE — Clinical Documentation Improvement (Signed)
MALNUTRITION DOCUMENTATION CLARIFICATION Please update your documentation within the medical record to reflect your response to this query.                                                                                         07/24/11    Dear [facility],  In a better effort to capture your patient's severity of illness, reflect appropriate length of stay and utilization of resources, a review of the patient medical record has revealed the following indicators.    Based on your clinical judgment, please clarify and document in a progress note and/or discharge summary the clinical condition associated with the following supporting information:  In responding to this query please exercise your independent judgment.  The fact that a query is asked, does not imply that any particular answer is desired or expected.  _______Severe Malnutrition   _______Other Condition________________ _______Cannot clinically determine     Supporting Information: Risk Factors:  Pancreatic cancer on chemo  Signs & Symptoms:Ht: 6' (182.9 cm)   Wt: 175 lb 7.8 oz (79.6 kg)   Ideal Wt: 80.9kg % Ideal Wt: 98 Usual Wt: 125kg % Usual Wt: 78 Body mass index is 23.80 kg/(m^2).  :  -Nutrition Consult: assessed on 07/23/11   You may use possible, probable, or suspect with inpatient documentation. possible, probable, suspected diagnoses MUST be documented at the time of discharge  Reviewed: no response to documentation 07/25/11 Thank You,  Sincerely, Andy Gauss  Clinical Documentation Specialist:  Pager [phone]  Health Information Management [place]

## 2011-07-24 NOTE — Progress Notes (Addendum)
Speech Language/Pathology Clinical/Bedside Swallow Evaluation Patient Details  Name: Mitchell Hines MRN: [id number] DOB: March 19, 1941 Today's Date: 07/24/2011 [id number]  Past Medical History:  Past Medical History  Diagnosis Date  . Pancreas cancer 2012  . Cholestasis 2012  . Liver metastases 2012  . Lung metastases 2012  . BPH (benign prostatic hyperplasia) 2012    with Dr. Alfredo Hines  . Pulmonary embolism 2012  . Weight loss, non-intentional 2012  . Poor appetite 2012  . GERD (gastroesophageal reflux disease) 06/29/2011  . Pancreatic insufficiency 06/29/2011  . Hypertension 06/29/2011  . Hemorrhoid 06/29/2011  . Protein calorie malnutrition   . Neutropenia   . UTI (lower urinary tract infection)   . Diabetes mellitus     oral   Past Surgical History:  Past Surgical History  Procedure Date  . Portacath placement 06/10/11 Powerport    Tip in lower SVC per Dr. Lowella Hines  . Percutaneous external biliary drain placement 06/07/11    Assessment/Recommendations/Treatment Plan  Suspected Esophageal Findings Suspected Esophageal Findings: Belching;Other (comment) (pt reports belching with liquids recently, denies reflux but note history of reflux and pt on carafate, zofran )  SLP Assessment Clinical Impression Statement: Pt appears with functional oropharyngeal swallow when observed with 4 ounces applesauce, 2 ounces water, 3 crackers.  Swallow appeared timely with clear voice throughout.  Pt reports improvement with swallowing today compared to yesterday.  Pt verbalized that secretions stuck in throat worsen his swallow and cough with expectoration is effective.  Warm foods are reportedly easier to swallow for this pt.  Xerostomia acknowledged for which SLP provided written compensatory tips.  100 pound weight loss reported since Sept 2012 which pt attributes to cancer, not lack of intake/appetite.  Large pills are difficult for pt to swallow over the last few months, lodging in throat  per pt.  Advised pt to talk to pharmacist regarding alteration of medication administration if needed and/or take medications with pudding/food following with water.  Cranial nerve exam unremarkable and pt denies neurological history.   No SLP follow up indicated secondary to education complete.  Thanks for this referral.   Risk for Aspiration: Mild Other Related Risk Factors: Decreased management of secretions  Recommendations Solid Consistency: Regular Liquid Consistency: Thin Liquid Administration via:  (limit straw usage if decr belching or cough with po) Medication Administration: Other (Comment) (defer to pt) Supervision: Patient able to self feed Compensations: Slow rate;Small sips/bites (drink liquids throughout meal) Postural Changes and/or Swallow Maneuvers: Seated upright 90 degrees Oral Care Recommendations: Oral care BID   Individuals Consulted Consulted and Agree with Results and Recommendations: Patient;MD  Oral Motor/Sensory Function  Labial ROM: Within Functional Limits Labial Symmetry: Within Functional Limits Labial Strength: Within Functional Limits Labial Sensation: Within Functional Limits Lingual ROM: Within Functional Limits Lingual Symmetry: Within Functional Limits Lingual Strength: Within Functional Limits Lingual Sensation: Within Functional Limits Facial ROM: Within Functional Limits Facial Symmetry: Within Functional Limits Facial Strength: Within Functional Limits Facial Sensation: Within Functional Limits Velum: Within Functional Limits Mandible: Within Functional Limits  Consistency Results  Ice Chips Ice chips: Not tested  Thin Liquid Thin Liquid: Within functional limits Presentation: Cup;Straw;Self Fed  Nectar Thick Liquid Nectar Thick Liquid: Not tested  Honey Thick Liquid Honey Thick Liquid: Not tested  Puree Puree: Within functional limits Presentation: Spoon  Solid Solid: Within functional limits Presentation: Self  Fed   Mitchell Hines 07/24/2011,2:44 PM   CXR 11/12 IMPRESSION:  Minimal bibasilar airspace opacities likely reflect atelectasis;  known small left basilar pulmonary  nodule is not readily  characterized on radiograph.

## 2011-07-25 LAB — GLUCOSE, CAPILLARY: Glucose-Capillary: 106 mg/dL — ABNORMAL HIGH (ref 70–99)

## 2011-07-25 LAB — CBC
HCT: 26.4 % — ABNORMAL LOW (ref 39.0–52.0)
RBC: 3.29 MIL/uL — ABNORMAL LOW (ref 4.22–5.81)
RDW: 16.3 % — ABNORMAL HIGH (ref 11.5–15.5)
WBC: 19.7 10*3/uL — ABNORMAL HIGH (ref 4.0–10.5)

## 2011-07-25 LAB — PROTIME-INR: INR: 1.78 — ABNORMAL HIGH (ref 0.00–1.49)

## 2011-07-25 MED ORDER — OXYCODONE HCL 5 MG PO TABS: 5.0000 mg | ORAL_TABLET | ORAL | Status: AC | PRN

## 2011-07-25 MED ORDER — CIPROFLOXACIN HCL 500 MG PO TABS: 500.0000 mg | ORAL_TABLET | Freq: Two times a day (BID) | ORAL | Status: AC

## 2011-07-25 MED ORDER — HEPARIN SOD (PORK) LOCK FLUSH 100 UNIT/ML IV SOLN
INTRAVENOUS | Status: AC
  Filled 2011-07-25: qty 5

## 2011-07-25 NOTE — Discharge Summary (Signed)
Physician Discharge Summary  Patient ID: Mitchell Hines MRN: [id number] DOB/AGE: Aug 10, 1941 70 y.o.  Admit date: 07/22/2011 Discharge date: 07/25/2011  Admission Diagnoses:  Discharge Diagnoses:  Principal Problem:  *Neutropenia with fever Active Problems:  Pancreas cancer  Pulmonary embolism  Hypertension  Dehydration  Neutropenia  Protein calorie malnutrition  DM type 2 (diabetes mellitus, type 2)  ARF (acute renal failure)  Pancytopenia  Sepsis   Discharged Condition: good  History of Present Illness:  Mitchell Hines is an 70 y.o. male the past medical history of pancreatic cancer. He is under the care of Dr. Gaylyn Rong for this. His last chemotherapy was on 07/12/2011. He presented to the emergency department with chief complaint of a two-day history of fevers and weakness. Patient denies any associated shortness of breath or cough. He does have a postnasal drainage and thick mucus in the back of his throat but no firm diabetes. He denies dysuria and hematuria. He notes that he has had a diminished appetite and has lost about 100 pounds over the past 6 months. There are no aggravating or alleviating factors. Upon initial evaluation in the emergency department, the patient was found to be febrile, hypotensive and neutropenic and subsequently was referred to the hospitalist service for further evaluation and treatment of a suspected urinary tract infection as the source of his current sepsis syndrome.  Patient Active Hospital Problem List: Neutropenia with fever (07/24/2011)   Assessment:   Plan: Thought to be initially likely related to pneumonic process patient initially on vancomycin and Zosyn. Subsequently after about 2-3 days by the therapist transition ciprofloxacin as patient had neutropenic fever Dr. Gaylyn Rong of oncology was consulted. 5 the patient had significant neutropenia patient was started on Neupogen and his white counts went from an absolute count of 8-19,000. The patient subsequent to  discontinue the patient will continue FOLFOX another logical treatment as an outpatient on 07/29/11 with Dr. Gaylyn Rong Pancreas cancer ()   Assessment:    Plan: Stage IV see above likely needs palliative consult Dr. Gaylyn Rong will arrange  Pulmonary embolism ()   Assessment:    Plan: She gets his Lovenox empirically at the cancer center. They're waiting to restage his cancer prior to determination of whether he needs a send out. Patient will continue to get Lovenox there. Hypertension (06/29/2011)   Assessment:    Plan: Well controlled continue medications-2 followup  Dehydration (07/15/2011)   Assessment:    Plan: Resolved with IV fluid repletion. Likely secondary to sepsis syndrome and his creatinine has trended down over the course of his stay  Neutropenia ()   Assessment:    Plan: Per Dr. Gaylyn Rong.  He has graciously seen the patient in the hospital and will follow with but, as an outpatient on the 19th  Protein calorie malnutrition ()   Assessment:    Plan: Likely secondary to stage IV pancreatic cancer. We will off on supplements at present time patient is to continue Creon. Patient also has a drain in his abdomen and this has to be internalized. This will be converted as an outpatient  DM type 2 (diabetes mellitus, type 2) (07/22/2011)   Assessment:    Plan: Moderately well controlled while in hospital.  ARF (acute renal failure) (07/22/2011)   Assessment: Resolved see above Pancytopenia (07/22/2011)   Assessment: See above  Sepsis (07/22/2011)   Assessment: Source thought to be likely pulmonary. Patient to continue ciprofloxacin empirically for another 7 days by mouth.        Consults: Oncologist-Dr. Jethro Bolus  Significant Diagnostic Studies: microbiology: blood culture: 10 07/22/2011 was negative x2 and urine culture: Showed 8000 colony-forming units and insignificant growth  Sputum culture was collected twice and was nonrepresentative of a lower respiratory  Treatments: IV hydration and  antibiotics: vancomycin and Zosyn  Discharge Exam: Blood pressure 126/72, pulse 79, temperature 97.6 F (36.4 C), temperature source Oral, resp. rate 18, height 6' (1.829 m), weight 79.6 kg (175 lb 7.8 oz), SpO2 97.00%. General appearance: alert, cooperative, appears stated age and mildly obese Head: Normocephalic, without obvious abnormality, atraumatic Ears: normal TM's and external ear canals both ears Nose: no discharge Throat: lips, mucosa, and tongue normal; teeth and gums normal Back: symmetric, no curvature. ROM normal. No CVA tenderness. Resp: clear to auscultation bilaterally and normal percussion bilaterally Chest wall: no tenderness GI: soft, non-tender; bowel sounds normal; no masses,  no organomegaly Skin: Skin color, texture, turgor normal. No rashes or lesions Lymph nodes: Cervical, supraclavicular, and axillary nodes normal.  Disposition: Home or Self Care  Discharge Orders    Future Appointments: Provider: Department: Dept Phone: Center:   07/27/2011 11:00 AM [facility] Inj Nurse [facility] [phone] None   07/29/2011 8:30 AM Jarrett Soho Hickerson [facility] [phone] None   07/29/2011 9:00 AM Jethro Bolus, MD [facility] [phone] None   07/29/2011 9:45 AM [facility] I26 Dns [facility] [phone] None   07/30/2011 4:00 PM [facility] [phone] None   07/31/2011 4:00 PM [facility] [phone] None   08/02/2011 4:00 PM [facility] [phone] None   08/03/2011 8:30 AM [facility] [phone] None   08/04/2011 8:15 AM [facility] Nurse [facility] [phone] None   08/05/2011 4:00 PM [facility] [phone] None   08/06/2011 10:15 AM [facility] E15 [facility] [phone] None   08/07/2011 4:00 PM [facility] In[facility] [phone] None   08/08/2011 4:00 PM [facility] Flush Nurse [facility] [phone] None   08/09/2011 4:00 PM [facility] In[facility] Nurse [facility] [phone] None   08/12/2011 4:00 PM [facility] In[facility] Nurse [facility] [phone] None   08/13/2011 4:00 PM [facility] In[facility] Nurse [facility] [phone] None   08/14/2011 4:00 PM [facility] Inj Nurse [facility] [phone] None   08/15/2011 4:00 PM [facility] In[facility] Nurse [facility] [phone] None   08/16/2011 4:00 PM [facility] Nurse [facility] [phone] None   08/19/2011 4:00 PM [facility] Nurse [facility] [phone] None   08/20/2011 4:00 PM [facility] Nurse [facility] [phone] None   08/21/2011 4:00 PM [facility] [phone] None   08/22/2011 4:00 PM [facility] [phone] None   08/23/2011 4:00 PM [facility] [phone] None   08/26/2011 4:00 PM [facility] In[facility] Nurse [facility] [phone] None   08/27/2011 4:00 PM [facility] Nurse [facility] [phone] None   08/28/2011 4:00 PM [facility] [phone] None   08/29/2011 4:00 PM [facility] [phone] None   08/30/2011 4:00 PM [facility] Nurse [facility] [phone] None     Current Discharge Medication List    START taking these medications   Details  ciprofloxacin (CIPRO) 500 MG tablet Take 1 tablet (500 mg total) by mouth 2 (two) times daily. Qty: 14 tablet, Refills: 0    oxyCODONE (OXY IR/ROXICODONE) 5 MG immediate release tablet Take 1 tablet (5 mg total) by mouth every 4 (four) hours as needed. Qty: 30  tablet, Refills: 0      CONTINUE these medications which have NOT CHANGED   Details  amLODipine (NORVASC) 10 MG tablet Take 10 mg by mouth daily.      aspirin 81 MG chewable tablet Chew 81 mg by mouth daily.      baclofen (LIORESAL) 10 MG tablet Take 5 mg by mouth 3 (three) times daily.      dronabinol (MARINOL) 2.5 MG capsule Take 2.5 mg by mouth  2 (two) times daily before a meal.      enoxaparin (LOVENOX) 120 MG/0.8ML SOLN Inject 120 mg into the skin daily.      HYDROcodone-acetaminophen (LORTAB) 7.5-500 MG/15ML solution Take 15 mLs by mouth every 6 (six) hours as needed.      lidocaine-prilocaine (EMLA) cream Apply 1 application topically as needed. Apply to portacath site one hr prior to needlestick     lipase/protease/amylase (CREON-10/PANCREASE) 12000 UNITS CPEP Take 2 capsules by mouth 3 (three) times daily before meals. Take 1 capsule before meals and if patient plans to eat a fatty food he will take 2 more capsules    lisinopril (PRINIVIL,ZESTRIL) 40 MG tablet Take 40 mg by mouth daily.      loperamide (IMODIUM) 2 MG capsule Take 2 mg by mouth 4 (four) times daily as needed.      NON FORMULARY Chemotherapy is done here at the cancer center twice a month on Tuesday. Patient is scheduled to have treatment on this Tuesday coming up. Patient is under the care of Dr. Jethro Bolus.    omeprazole (PRILOSEC) 40 MG capsule Take 40 mg by mouth daily.      ondansetron (ZOFRAN) 8 MG tablet Take 8 mg by mouth every 12 (twelve) hours as needed.      prochlorperazine (COMPAZINE) 10 MG tablet Take 10 mg by mouth every 6 (six) hours as needed.      promethazine (PHENERGAN) 25 MG suppository Place 25 mg rectally daily as needed.      sitaGLIPtin (JANUVIA) 100 MG tablet Take 100 mg by mouth daily.      sucralfate (CARAFATE) 1 GM/10ML suspension Take 1 g by mouth 4 (four) times daily. Patient only uses liquid when he is having trouble with swallowing tablets    terazosin (HYTRIN) 5 MG capsule Take 5 mg by mouth at bedtime.        STOP taking these medications     sucralfate (CARAFATE) 1 G tablet          Signed: Jaylena Hines,JAI 07/25/2011, 3:54 PM

## 2011-07-26 ENCOUNTER — Other Ambulatory Visit: Payer: Self-pay | Admitting: Oncology

## 2011-07-26 ENCOUNTER — Other Ambulatory Visit: Payer: Self-pay | Admitting: Certified Registered Nurse Anesthetist

## 2011-07-26 ENCOUNTER — Ambulatory Visit (HOSPITAL_BASED_OUTPATIENT_CLINIC_OR_DEPARTMENT_OTHER): Payer: Medicare Other

## 2011-07-26 ENCOUNTER — Ambulatory Visit: Payer: Medicare Other

## 2011-07-26 VITALS — BP 111/66 | HR 90 | Temp 98.0°F

## 2011-07-26 DIAGNOSIS — I2699 Other pulmonary embolism without acute cor pulmonale: Secondary | ICD-10-CM

## 2011-07-26 MED ORDER — ENOXAPARIN SODIUM 120 MG/0.8ML ~~LOC~~ SOLN
120.0000 mg | SUBCUTANEOUS | Status: DC
  Administered 2011-07-26: 120 mg via SUBCUTANEOUS
  Filled 2011-07-26: qty 0.8

## 2011-07-27 ENCOUNTER — Ambulatory Visit (HOSPITAL_BASED_OUTPATIENT_CLINIC_OR_DEPARTMENT_OTHER): Payer: Medicare Other

## 2011-07-27 DIAGNOSIS — I2699 Other pulmonary embolism without acute cor pulmonale: Secondary | ICD-10-CM

## 2011-07-27 DIAGNOSIS — E860 Dehydration: Secondary | ICD-10-CM

## 2011-07-27 MED ORDER — ENOXAPARIN SODIUM 120 MG/0.8ML ~~LOC~~ SOLN
120.0000 mg | SUBCUTANEOUS | Status: DC
  Administered 2011-07-27: 120 mg via SUBCUTANEOUS

## 2011-07-28 ENCOUNTER — Ambulatory Visit: Payer: Medicare Other

## 2011-07-28 LAB — CULTURE, BLOOD (ROUTINE X 2)
Culture  Setup Time: 201211120910
Culture: NO GROWTH

## 2011-07-29 ENCOUNTER — Ambulatory Visit (HOSPITAL_BASED_OUTPATIENT_CLINIC_OR_DEPARTMENT_OTHER): Payer: Medicare Other | Admitting: Oncology

## 2011-07-29 ENCOUNTER — Ambulatory Visit: Payer: Medicare Other

## 2011-07-29 ENCOUNTER — Ambulatory Visit (HOSPITAL_BASED_OUTPATIENT_CLINIC_OR_DEPARTMENT_OTHER): Payer: Medicare Other

## 2011-07-29 ENCOUNTER — Other Ambulatory Visit (HOSPITAL_BASED_OUTPATIENT_CLINIC_OR_DEPARTMENT_OTHER): Payer: Medicare Other | Admitting: Lab

## 2011-07-29 ENCOUNTER — Other Ambulatory Visit: Payer: Self-pay | Admitting: Oncology

## 2011-07-29 VITALS — BP 129/64 | HR 95 | Temp 97.2°F | Ht 72.0 in | Wt 176.6 lb

## 2011-07-29 DIAGNOSIS — R5081 Fever presenting with conditions classified elsewhere: Secondary | ICD-10-CM

## 2011-07-29 DIAGNOSIS — I2699 Other pulmonary embolism without acute cor pulmonale: Secondary | ICD-10-CM

## 2011-07-29 DIAGNOSIS — D702 Other drug-induced agranulocytosis: Secondary | ICD-10-CM

## 2011-07-29 DIAGNOSIS — D709 Neutropenia, unspecified: Secondary | ICD-10-CM

## 2011-07-29 DIAGNOSIS — E860 Dehydration: Secondary | ICD-10-CM

## 2011-07-29 DIAGNOSIS — C259 Malignant neoplasm of pancreas, unspecified: Secondary | ICD-10-CM

## 2011-07-29 DIAGNOSIS — C7800 Secondary malignant neoplasm of unspecified lung: Secondary | ICD-10-CM

## 2011-07-29 DIAGNOSIS — C787 Secondary malignant neoplasm of liver and intrahepatic bile duct: Secondary | ICD-10-CM

## 2011-07-29 DIAGNOSIS — Z5111 Encounter for antineoplastic chemotherapy: Secondary | ICD-10-CM

## 2011-07-29 LAB — COMPREHENSIVE METABOLIC PANEL
Albumin: 2.8 g/dL — ABNORMAL LOW (ref 3.5–5.2)
Alkaline Phosphatase: 127 U/L — ABNORMAL HIGH (ref 39–117)
BUN: 9 mg/dL (ref 6–23)
CO2: 21 mEq/L (ref 19–32)
Glucose, Bld: 92 mg/dL (ref 70–99)
Sodium: 142 mEq/L (ref 135–145)
Total Bilirubin: 2.5 mg/dL — ABNORMAL HIGH (ref 0.3–1.2)
Total Protein: 6.1 g/dL (ref 6.0–8.3)

## 2011-07-29 LAB — CBC WITH DIFFERENTIAL/PLATELET
Basophils Absolute: 0.1 10*3/uL (ref 0.0–0.1)
EOS%: 0.4 % (ref 0.0–7.0)
Eosinophils Absolute: 0 10*3/uL (ref 0.0–0.5)
HCT: 30.4 % — ABNORMAL LOW (ref 38.4–49.9)
HGB: 10.2 g/dL — ABNORMAL LOW (ref 13.0–17.1)
MCH: 26.5 pg — ABNORMAL LOW (ref 27.2–33.4)
MCV: 79 fL — ABNORMAL LOW (ref 79.3–98.0)
MONO%: 22.3 % — ABNORMAL HIGH (ref 0.0–14.0)
NEUT#: 5.9 10*3/uL (ref 1.5–6.5)
NEUT%: 54.8 % (ref 39.0–75.0)
Platelets: 169 10*3/uL (ref 140–400)
RDW: 17.1 % — ABNORMAL HIGH (ref 11.0–14.6)

## 2011-07-29 LAB — CANCER ANTIGEN 19-9: CA 19-9: 49370.9 U/mL — ABNORMAL HIGH (ref <35.0)

## 2011-07-29 MED ORDER — LEUCOVORIN CALCIUM INJECTION 350 MG
400.0000 mg/m2 | Freq: Once | INTRAVENOUS | Status: AC
  Administered 2011-07-29: 836 mg via INTRAVENOUS
  Filled 2011-07-29: qty 41.8

## 2011-07-29 MED ORDER — DEXAMETHASONE SODIUM PHOSPHATE 10 MG/ML IJ SOLN
10.0000 mg | Freq: Once | INTRAMUSCULAR | Status: AC
  Administered 2011-07-29: 10 mg via INTRAVENOUS

## 2011-07-29 MED ORDER — ENOXAPARIN SODIUM 120 MG/0.8ML ~~LOC~~ SOLN
120.0000 mg | SUBCUTANEOUS | Status: DC
  Administered 2011-07-29: 120 mg via SUBCUTANEOUS
  Filled 2011-07-29: qty 0.8

## 2011-07-29 MED ORDER — DEXTROSE 5 % IV SOLN
Freq: Once | INTRAVENOUS | Status: AC
  Administered 2011-07-29: 10:00:00 via INTRAVENOUS

## 2011-07-29 MED ORDER — ONDANSETRON 8 MG/50ML IVPB (CHCC)
8.0000 mg | Freq: Once | INTRAVENOUS | Status: AC
  Administered 2011-07-29: 8 mg via INTRAVENOUS

## 2011-07-29 MED ORDER — SODIUM CHLORIDE 0.9 % IV SOLN
1800.0000 mg/m2 | INTRAVENOUS | Status: DC
  Administered 2011-07-29: 3750 mg via INTRAVENOUS
  Filled 2011-07-29: qty 75

## 2011-07-29 MED ORDER — OXALIPLATIN CHEMO INJECTION 100 MG/20ML
85.0000 mg/m2 | Freq: Once | INTRAVENOUS | Status: AC
  Administered 2011-07-29: 180 mg via INTRAVENOUS
  Filled 2011-07-29: qty 36

## 2011-07-29 MED ORDER — FLUOROURACIL CHEMO INJECTION 2.5 GM/50ML
400.0000 mg/m2 | Freq: Once | INTRAVENOUS | Status: AC
  Administered 2011-07-29: 850 mg via INTRAVENOUS
  Filled 2011-07-29: qty 17

## 2011-07-29 NOTE — Progress Notes (Signed)
[facility] OFFICE PROGRESS NOTE  No primary provider on file.  CC: Mitchell Hines. 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   CURRENT THERAPY:  started on FOLFOX q2wks on 06/25/2011.  INTERVAL HISTORY: Mitchell Hines 70 y.o. male returns for regular follow up.  I personally reviewed his recent hospital discharge.  He was admitted to the hospital last week for neutropenic fever.  He was discharged home on oral Cipro with 3 more days.  He denies fever/Mitchell Hines/mucositis/ nausea/vomiting/bleeding symptoms/jaundice/ abd pain/ purulent discharge or erythema at site of percutaneous biliary drainage tube.   He has been having slight edema at bilateral ankles, worse as the day progresses, better with leg elevation at night.  He denies pain in calves/thighs bilaterally.   He started taking Marinol about 2 wks ago and has not had significant improvement of his appetite yet.  He has mild fatigue a few days after chemo.  She spends about 40% of awake time sitting around.   MEDICAL HISTORY: Past Medical History  Diagnosis Date  . Pancreas cancer 2012  . Cholestasis 2012  . Liver metastases 2012  . Lung metastases 2012  . BPH (benign prostatic hyperplasia) 2012    with Dr. Alfredo Martinez  . Pulmonary embolism 2012  . Weight loss, non-intentional 2012  . Poor appetite 2012  . GERD (gastroesophageal reflux disease) 06/29/2011  . Pancreatic insufficiency 06/29/2011  . Hypertension 06/29/2011  . Hemorrhoid 06/29/2011  . Protein calorie malnutrition   . Neutropenia   . UTI (lower urinary tract infection)   . Diabetes mellitus     oral    SURGICAL HISTORY:  Past Surgical History  Procedure Date  . Portacath placement 06/10/11 [place]    Tip in lower SVC per Dr. Lowella Dandy  . Percutaneous external biliary drain placement 06/07/11    MEDICATIONS: Current Outpatient Prescriptions  Medication Sig Dispense Refill  . aspirin 81 MG  chewable tablet Chew 81 mg by mouth daily.        . baclofen (LIORESAL) 10 MG tablet Take 5 mg by mouth 3 (three) times daily.        . ciprofloxacin (CIPRO) 500 MG tablet Take 1 tablet (500 mg total) by mouth 2 (two) times daily.  14 tablet  0  . dronabinol (MARINOL) 2.5 MG capsule Take 2.5 mg by mouth 2 (two) times daily before a meal.        . enoxaparin (LOVENOX) 120 MG/0.8ML SOLN Inject 120 mg into the skin daily.        Marland Kitchen HYDROcodone-acetaminophen (LORTAB) 7.5-500 MG/15ML solution Take 15 mLs by mouth every 6 (six) hours as needed.        . lidocaine-prilocaine (EMLA) cream Apply 1 application topically as needed. Apply to portacath site one hr prior to needlestick       . lipase/protease/amylase (CREON-10/PANCREASE) 12000 UNITS CPEP Take 2 capsules by mouth 3 (three) times daily before meals. Take 1 capsule before meals and if patient plans to eat a fatty food he will take 2 more capsules      . loperamide (IMODIUM) 2 MG capsule Take 2 mg by mouth 4 (four) times daily as needed.        . NON FORMULARY Chemotherapy is done here at the cancer center twice a month on Tuesday. Patient is scheduled to have treatment on this Tuesday coming up. Patient is under the care of Dr. Jethro Bolus.      Marland Kitchen  omeprazole (PRILOSEC) 40 MG capsule Take 40 mg by mouth daily.        . ondansetron (ZOFRAN) 8 MG tablet Take 8 mg by mouth every 12 (twelve) hours as needed.        Marland Kitchen oxyCODONE (OXY IR/ROXICODONE) 5 MG immediate release tablet Take 1 tablet (5 mg total) by mouth every 4 (four) hours as needed.  30 tablet  0  . prochlorperazine (COMPAZINE) 10 MG tablet Take 10 mg by mouth every 6 (six) hours as needed.        . promethazine (PHENERGAN) 25 MG suppository Place 25 mg rectally daily as needed.        . sitaGLIPtin (JANUVIA) 100 MG tablet Take 100 mg by mouth daily.        . sucralfate (CARAFATE) 1 GM/10ML suspension Take 1 g by mouth 4 (four) times daily. Patient only uses liquid when he is having trouble with  swallowing tablets      . terazosin (HYTRIN) 5 MG capsule Take 5 mg by mouth at bedtime.        Marland Kitchen amLODipine (NORVASC) 10 MG tablet Take 10 mg by mouth daily.          ALLERGIES:   has no known allergies.  REVIEW OF SYSTEMS:  The rest of the 14-point review of system was negative.   Filed Vitals:   07/29/11 0851  BP: 129/64  Pulse: 95  Temp: 97.2 F (36.2 C)  ECOG 1-2.   Wt Readings from Last 3 Encounters:  07/29/11 176 lb 9.6 oz (80.105 kg)  07/23/11 175 lb 7.8 oz (79.6 kg)  06/19/11 189 lb (85.73 kg)   ECOG Performance status:   PHYSICAL EXAMINATION:   General:  Thin appearing male in no acute distress.  Eyes:  No  icterus.  ENT:  There were no oropharyngeal lesions.  Neck was without thyromegaly.  Lymphatics:  Negative cervical, supraclavicular or axillary adenopathy.  Respiratory: lungs were clear bilaterally without wheezing or crackles.  Cardiovascular:  Regular rate and rhythm, S1/S2, without murmur, rub or gallop.  There was no pedal edema.  GI:  abdomen was soft, flat, nontender, nondistended, without organomegaly.  His percutaneous biliary drainage tube without purulent discharge or tenderness.  Muscoloskeletal:  no spinal tenderness of palpation of vertebral spine.  Skin exam was without echymosis, petichae.  Neuro exam was nonfocal.  Patient was able to get on and off exam table without assistance.  Gait was normal.  Patient was alerted and oriented.  Attention was good.   Language was appropriate.  Mood was normal without depression.  Speech was not pressured.  Thought content was not tangential.     LABORATORY/RADIOLOGY DATA:  Lab Results  Component Value Date   WBC 10.8* 07/29/2011   HGB 10.2* 07/29/2011   HCT 30.4* 07/29/2011   PLT 169 07/29/2011   GLUCOSE 89 07/24/2011   ALT 88* 07/22/2011   AST 51* 07/22/2011   NA 141 07/24/2011   K 3.6 07/24/2011   CL 113* 07/24/2011   CREATININE 1.16 07/24/2011   BUN 17 07/24/2011   CO2 22 07/24/2011   INR 1.78*  07/25/2011     ASSESSMENT AND PLAN:   1. Metastatic pancreatic cancer.  He is s/p 2 cycles of chemo with grade 2 fatigue, grade 3 cytopenia with neutropenic fever.  He wanted to proceed with cycle 3.  I recommended to decrease dose of 5FU continuous pump by 25% due to these side effects.  I also recommended Neulasta the  day he comes back (in 2 days) to decrease the risk of neutropenic fever.   2. 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  Once his bilirubin is improved to be around 2, then I will refer him back to IR to see whether the percutaneous biliary drainage can be internalized.   3. Renal insufficiency:  It improved when he was in the hospital receiving IVF.  His Cr today is pending. 4. History of pulmonary embolism, subsegmental.  This asymptomatic.  He is on Lovenox 120 mg subcu daily until we restage him from the cancer standpoint.  We will see what is going on with the pulmonary embolism as well at that time.  He is in the donut hole and cannot pay for  the medication himself, so he is getting it here every day. 5. Pedal edema:  Bilateral; without calf/thigh pain.  Doubt if DVT since he is already on therapeutic dose of Lovenox.  This is most likely due to hypoalbuminemia or venous stasis.  I advised him on more protein in diet, continue Marinol, low salt, and compression stocking.  In the future, if it worsens, I may consider Lasix.  6. Calorie protein malnutrition.  This is secondary to the cancer and the chemotherapy.  Continue with Marinol.   7. Diabetes mellitus, type II.  He is on sitagliptin per PCP.  He checks his sugar once every day. 8. Nausea and vomiting prophylaxis.  He has Compazine, Zofran and Ativan p.r.n.  9. Hypertension well-controlled on lisinopril per PCP.  10. Pancreatic insufficiency from pancreatic cancer.  He is on Creon 1 capsule 3 times a day.  I advised him to increase it to 2 capsules if he eats fatty food. 11. Code status.  Full code for now.  I  discussed code status before in the past, however the patient has not had a chance to discuss with his wife yet.  With the restaging in December 2012, I will address this issue again.  Total length of time of the face-to-face encounter was 30 minutes.  More than 50% of time was spent in counseling and coordination of care.

## 2011-07-29 NOTE — Patient Instructions (Signed)
1355-Pt discharged ambulatory with next appointment confirmed.  Pt aware to call with any questions or concerns.

## 2011-07-30 ENCOUNTER — Ambulatory Visit: Payer: Medicare Other

## 2011-07-30 ENCOUNTER — Other Ambulatory Visit: Payer: Self-pay | Admitting: Oncology

## 2011-07-30 ENCOUNTER — Ambulatory Visit (HOSPITAL_BASED_OUTPATIENT_CLINIC_OR_DEPARTMENT_OTHER): Payer: Medicare Other

## 2011-07-30 DIAGNOSIS — I2699 Other pulmonary embolism without acute cor pulmonale: Secondary | ICD-10-CM

## 2011-07-30 DIAGNOSIS — E860 Dehydration: Secondary | ICD-10-CM

## 2011-07-30 MED ORDER — ENOXAPARIN SODIUM 120 MG/0.8ML ~~LOC~~ SOLN
120.0000 mg | SUBCUTANEOUS | Status: DC
  Administered 2011-07-30: 120 mg via SUBCUTANEOUS
  Filled 2011-07-30: qty 0.8

## 2011-07-31 ENCOUNTER — Ambulatory Visit: Payer: Medicare Other

## 2011-07-31 ENCOUNTER — Ambulatory Visit (HOSPITAL_BASED_OUTPATIENT_CLINIC_OR_DEPARTMENT_OTHER): Payer: Medicare Other

## 2011-07-31 ENCOUNTER — Other Ambulatory Visit: Payer: Self-pay | Admitting: Oncology

## 2011-07-31 DIAGNOSIS — I2699 Other pulmonary embolism without acute cor pulmonale: Secondary | ICD-10-CM

## 2011-07-31 DIAGNOSIS — D709 Neutropenia, unspecified: Secondary | ICD-10-CM

## 2011-07-31 DIAGNOSIS — D702 Other drug-induced agranulocytosis: Secondary | ICD-10-CM

## 2011-07-31 DIAGNOSIS — C259 Malignant neoplasm of pancreas, unspecified: Secondary | ICD-10-CM

## 2011-07-31 DIAGNOSIS — E860 Dehydration: Secondary | ICD-10-CM

## 2011-07-31 MED ORDER — PEGFILGRASTIM INJECTION 6 MG/0.6ML
6.0000 mg | Freq: Once | SUBCUTANEOUS | Status: AC
  Administered 2011-07-31: 6 mg via SUBCUTANEOUS
  Filled 2011-07-31: qty 0.6

## 2011-07-31 MED ORDER — SODIUM CHLORIDE 0.9 % IJ SOLN
10.0000 mL | INTRAMUSCULAR | Status: DC | PRN
  Administered 2011-07-31: 10 mL
  Filled 2011-07-31: qty 10

## 2011-07-31 MED ORDER — HEPARIN SOD (PORK) LOCK FLUSH 100 UNIT/ML IV SOLN
500.0000 [IU] | Freq: Once | INTRAVENOUS | Status: AC | PRN
  Administered 2011-07-31: 500 [IU]
  Filled 2011-07-31: qty 5

## 2011-07-31 MED ORDER — ENOXAPARIN SODIUM 120 MG/0.8ML ~~LOC~~ SOLN
120.0000 mg | SUBCUTANEOUS | Status: DC
  Administered 2011-07-31: 120 mg via SUBCUTANEOUS
  Filled 2011-07-31: qty 0.8

## 2011-07-31 NOTE — Patient Instructions (Signed)
Call;  MD for problems.  Patient given 2 Lotronex  To give @ home on Thursda 11/22 and  Sunday 08/04/11

## 2011-08-01 ENCOUNTER — Ambulatory Visit: Payer: Medicare Other

## 2011-08-02 ENCOUNTER — Ambulatory Visit: Payer: Medicare Other

## 2011-08-02 ENCOUNTER — Ambulatory Visit (HOSPITAL_BASED_OUTPATIENT_CLINIC_OR_DEPARTMENT_OTHER): Payer: Medicare Other

## 2011-08-02 VITALS — BP 99/58 | HR 99 | Temp 97.8°F

## 2011-08-02 DIAGNOSIS — I2699 Other pulmonary embolism without acute cor pulmonale: Secondary | ICD-10-CM

## 2011-08-02 MED ORDER — ENOXAPARIN SODIUM 120 MG/0.8ML ~~LOC~~ SOLN
120.0000 mg | SUBCUTANEOUS | Status: DC
  Administered 2011-08-02: 120 mg via SUBCUTANEOUS

## 2011-08-03 ENCOUNTER — Ambulatory Visit (HOSPITAL_BASED_OUTPATIENT_CLINIC_OR_DEPARTMENT_OTHER): Payer: Medicare Other

## 2011-08-03 DIAGNOSIS — K219 Gastro-esophageal reflux disease without esophagitis: Secondary | ICD-10-CM

## 2011-08-03 DIAGNOSIS — I10 Essential (primary) hypertension: Secondary | ICD-10-CM

## 2011-08-03 DIAGNOSIS — C787 Secondary malignant neoplasm of liver and intrahepatic bile duct: Secondary | ICD-10-CM

## 2011-08-03 DIAGNOSIS — K649 Unspecified hemorrhoids: Secondary | ICD-10-CM

## 2011-08-03 DIAGNOSIS — R634 Abnormal weight loss: Secondary | ICD-10-CM

## 2011-08-03 DIAGNOSIS — R630 Anorexia: Secondary | ICD-10-CM

## 2011-08-03 DIAGNOSIS — C259 Malignant neoplasm of pancreas, unspecified: Secondary | ICD-10-CM

## 2011-08-03 DIAGNOSIS — D709 Neutropenia, unspecified: Secondary | ICD-10-CM

## 2011-08-03 DIAGNOSIS — E46 Unspecified protein-calorie malnutrition: Secondary | ICD-10-CM

## 2011-08-03 DIAGNOSIS — E119 Type 2 diabetes mellitus without complications: Secondary | ICD-10-CM

## 2011-08-03 DIAGNOSIS — I2699 Other pulmonary embolism without acute cor pulmonale: Secondary | ICD-10-CM

## 2011-08-03 DIAGNOSIS — A419 Sepsis, unspecified organism: Secondary | ICD-10-CM

## 2011-08-03 DIAGNOSIS — N400 Enlarged prostate without lower urinary tract symptoms: Secondary | ICD-10-CM

## 2011-08-03 DIAGNOSIS — C7800 Secondary malignant neoplasm of unspecified lung: Secondary | ICD-10-CM

## 2011-08-03 DIAGNOSIS — N179 Acute kidney failure, unspecified: Secondary | ICD-10-CM

## 2011-08-03 DIAGNOSIS — K8689 Other specified diseases of pancreas: Secondary | ICD-10-CM

## 2011-08-03 DIAGNOSIS — E860 Dehydration: Secondary | ICD-10-CM

## 2011-08-03 MED ORDER — ENOXAPARIN SODIUM 120 MG/0.8ML ~~LOC~~ SOLN
120.0000 mg | SUBCUTANEOUS | Status: DC
  Administered 2011-08-03: 120 mg via SUBCUTANEOUS

## 2011-08-04 ENCOUNTER — Ambulatory Visit: Payer: Medicare Other

## 2011-08-05 ENCOUNTER — Ambulatory Visit: Payer: Medicare Other

## 2011-08-05 ENCOUNTER — Ambulatory Visit (HOSPITAL_BASED_OUTPATIENT_CLINIC_OR_DEPARTMENT_OTHER): Payer: Medicare Other

## 2011-08-05 DIAGNOSIS — I2699 Other pulmonary embolism without acute cor pulmonale: Secondary | ICD-10-CM

## 2011-08-05 DIAGNOSIS — E860 Dehydration: Secondary | ICD-10-CM

## 2011-08-05 MED ORDER — ENOXAPARIN SODIUM 120 MG/0.8ML ~~LOC~~ SOLN
120.0000 mg | SUBCUTANEOUS | Status: DC
  Administered 2011-08-05: 120 mg via SUBCUTANEOUS
  Filled 2011-08-05: qty 0.8

## 2011-08-06 ENCOUNTER — Ambulatory Visit: Payer: Medicare Other

## 2011-08-06 ENCOUNTER — Ambulatory Visit (HOSPITAL_BASED_OUTPATIENT_CLINIC_OR_DEPARTMENT_OTHER): Payer: Medicare Other

## 2011-08-06 VITALS — BP 114/71 | HR 94 | Temp 98.3°F

## 2011-08-06 DIAGNOSIS — I2699 Other pulmonary embolism without acute cor pulmonale: Secondary | ICD-10-CM

## 2011-08-06 MED ORDER — ENOXAPARIN SODIUM 120 MG/0.8ML ~~LOC~~ SOLN
120.0000 mg | SUBCUTANEOUS | Status: DC
  Administered 2011-08-06: 120 mg via SUBCUTANEOUS
  Filled 2011-08-06: qty 0.8

## 2011-08-07 ENCOUNTER — Ambulatory Visit: Payer: Medicare Other

## 2011-08-07 ENCOUNTER — Ambulatory Visit (HOSPITAL_BASED_OUTPATIENT_CLINIC_OR_DEPARTMENT_OTHER): Payer: Medicare Other

## 2011-08-07 VITALS — BP 100/65 | HR 100 | Temp 98.1°F

## 2011-08-07 DIAGNOSIS — I2699 Other pulmonary embolism without acute cor pulmonale: Secondary | ICD-10-CM

## 2011-08-07 MED ORDER — ENOXAPARIN SODIUM 120 MG/0.8ML ~~LOC~~ SOLN
120.0000 mg | SUBCUTANEOUS | Status: DC
  Administered 2011-08-07: 120 mg via SUBCUTANEOUS
  Filled 2011-08-07: qty 0.8

## 2011-08-08 ENCOUNTER — Ambulatory Visit (HOSPITAL_BASED_OUTPATIENT_CLINIC_OR_DEPARTMENT_OTHER): Payer: Medicare Other

## 2011-08-08 ENCOUNTER — Telehealth: Payer: Self-pay | Admitting: *Deleted

## 2011-08-08 ENCOUNTER — Ambulatory Visit: Payer: Medicare Other

## 2011-08-08 DIAGNOSIS — E860 Dehydration: Secondary | ICD-10-CM

## 2011-08-08 DIAGNOSIS — I2699 Other pulmonary embolism without acute cor pulmonale: Secondary | ICD-10-CM

## 2011-08-08 MED ORDER — ENOXAPARIN SODIUM 120 MG/0.8ML ~~LOC~~ SOLN
120.0000 mg | SUBCUTANEOUS | Status: DC
  Administered 2011-08-08: 120 mg via SUBCUTANEOUS
  Filled 2011-08-08: qty 0.8

## 2011-08-08 NOTE — Telephone Encounter (Signed)
Wife called to report pt seems to be having difficulty eating and drinking w/o gagging and coughing up a lot of phlegm. Often this causes pt to also vomit whatever little food or fluid he just took.  Wife states this happens suddenly and pt does not c/o nausea.  States occasional constipation but managed at this time.  She is concerned and wanted to notify Dr. Gaylyn Rong.  Pt has appt to see Dr. Gaylyn Rong tomorrow.  Informed wife I will call back if Dr. Gaylyn Rong has any new orders/instructions today or he may address on visit tomorrow.  She verbalized understanding.

## 2011-08-09 ENCOUNTER — Ambulatory Visit: Payer: Medicare Other

## 2011-08-09 ENCOUNTER — Other Ambulatory Visit: Payer: Self-pay | Admitting: Oncology

## 2011-08-09 ENCOUNTER — Ambulatory Visit (HOSPITAL_BASED_OUTPATIENT_CLINIC_OR_DEPARTMENT_OTHER): Payer: Medicare Other | Admitting: Oncology

## 2011-08-09 ENCOUNTER — Telehealth: Payer: Self-pay | Admitting: Oncology

## 2011-08-09 ENCOUNTER — Other Ambulatory Visit (HOSPITAL_BASED_OUTPATIENT_CLINIC_OR_DEPARTMENT_OTHER): Payer: Medicare Other | Admitting: Lab

## 2011-08-09 VITALS — BP 96/63 | HR 112 | Temp 97.5°F | Ht 72.0 in | Wt 155.8 lb

## 2011-08-09 DIAGNOSIS — E860 Dehydration: Secondary | ICD-10-CM

## 2011-08-09 DIAGNOSIS — C259 Malignant neoplasm of pancreas, unspecified: Secondary | ICD-10-CM

## 2011-08-09 DIAGNOSIS — E876 Hypokalemia: Secondary | ICD-10-CM

## 2011-08-09 DIAGNOSIS — E46 Unspecified protein-calorie malnutrition: Secondary | ICD-10-CM

## 2011-08-09 DIAGNOSIS — I2699 Other pulmonary embolism without acute cor pulmonale: Secondary | ICD-10-CM

## 2011-08-09 LAB — COMPREHENSIVE METABOLIC PANEL
BUN: 23 mg/dL (ref 6–23)
CO2: 19 mEq/L (ref 19–32)
Creatinine, Ser: 1.94 mg/dL — ABNORMAL HIGH (ref 0.50–1.35)
Glucose, Bld: 117 mg/dL — ABNORMAL HIGH (ref 70–99)
Total Bilirubin: 2.1 mg/dL — ABNORMAL HIGH (ref 0.3–1.2)
Total Protein: 7 g/dL (ref 6.0–8.3)

## 2011-08-09 LAB — CBC WITH DIFFERENTIAL/PLATELET
BASO%: 0.4 % (ref 0.0–2.0)
Eosinophils Absolute: 0 10*3/uL (ref 0.0–0.5)
MCHC: 33 g/dL (ref 32.0–36.0)
MONO#: 1.8 10*3/uL — ABNORMAL HIGH (ref 0.1–0.9)
NEUT#: 11.7 10*3/uL — ABNORMAL HIGH (ref 1.5–6.5)
Platelets: 170 10*3/uL (ref 140–400)
RBC: 3.96 10*6/uL — ABNORMAL LOW (ref 4.20–5.82)
WBC: 16.1 10*3/uL — ABNORMAL HIGH (ref 4.0–10.3)
lymph#: 2.4 10*3/uL (ref 0.9–3.3)

## 2011-08-09 LAB — CANCER ANTIGEN 19-9: CA 19-9: 57996.2 U/mL — ABNORMAL HIGH (ref <35.0)

## 2011-08-09 MED ORDER — ENOXAPARIN SODIUM 120 MG/0.8ML ~~LOC~~ SOLN
120.0000 mg | SUBCUTANEOUS | Status: DC
  Administered 2011-08-09: 120 mg via SUBCUTANEOUS
  Filled 2011-08-09: qty 0.8

## 2011-08-09 MED ORDER — POTASSIUM CHLORIDE 20 MEQ/15ML (10%) PO LIQD: 40.0000 meq | Freq: Two times a day (BID) | ORAL | Status: AC

## 2011-08-09 MED ORDER — METOCLOPRAMIDE HCL 10 MG PO TABS: 10.0000 mg | ORAL_TABLET | Freq: Three times a day (TID) | ORAL | Status: AC | PRN

## 2011-08-09 NOTE — Telephone Encounter (Signed)
gve the pt's wife the dec,jan 2013 appt calendar

## 2011-08-09 NOTE — Progress Notes (Signed)
[facility] OFFICE PROGRESS NOTE  Dartha Lodge, MD  CC: Zannie Kehr. 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   CURRENT THERAPY:  started on FOLFOX q2wks on 06/25/2011.  INTERVAL HISTORY: Mitchell Hines 70 y.o. male returns for regular follow up.  He reported that he has had more thick phlegm in the throat and thus at time when he tries to eat/drink, he would gag.  He has poor appetite.  He only drink Ensure maybe one can every other day.  He only drinks about 16 oz of water extra daily apart from his solid food.  He has fatigue and spends a lot of time at rest position.  He has less jaundice than before.  His urine is lighter than before.  He does not have fever, mucositis, nausea/vomiting, abdominal pain, bleeding symptoms, diarrhea/constipation.   MEDICAL HISTORY: Past Medical History  Diagnosis Date  . Pancreas cancer 2012  . Cholestasis 2012  . Liver metastases 2012  . Lung metastases 2012  . BPH (benign prostatic hyperplasia) 2012    with Dr. Alfredo Martinez  . Pulmonary embolism 2012  . Weight loss, non-intentional 2012  . Poor appetite 2012  . GERD (gastroesophageal reflux disease) 06/29/2011  . Pancreatic insufficiency 06/29/2011  . Hypertension 06/29/2011  . Hemorrhoid 06/29/2011  . Protein calorie malnutrition   . Neutropenia   . UTI (lower urinary tract infection)   . Diabetes mellitus     oral    SURGICAL HISTORY:  Past Surgical History  Procedure Date  . Portacath placement 06/10/11 Powerport    Tip in lower SVC per Dr. Lowella Dandy  . Percutaneous external biliary drain placement 06/07/11    MEDICATIONS: Current Outpatient Prescriptions  Medication Sig Dispense Refill  . aspirin 81 MG chewable tablet Chew 81 mg by mouth daily.        . baclofen (LIORESAL) 10 MG tablet Take 5 mg by mouth 3 (three) times daily.        Marland Kitchen dronabinol (MARINOL) 2.5 MG capsule Take 2.5 mg by mouth 2 (two) times  daily before a meal.        . enoxaparin (LOVENOX) 120 MG/0.8ML SOLN Inject 120 mg into the skin daily.        Marland Kitchen lidocaine-prilocaine (EMLA) cream Apply 1 application topically as needed. Apply to portacath site one hr prior to needlestick       . lipase/protease/amylase (CREON-10/PANCREASE) 12000 UNITS CPEP Take 2 capsules by mouth 3 (three) times daily before meals. Take 1 capsule before meals and if patient plans to eat a fatty food he will take 2 more capsules      . loperamide (IMODIUM) 2 MG capsule Take 2 mg by mouth 4 (four) times daily as needed.        . NON FORMULARY Chemotherapy is done here at the cancer center twice a month on Tuesday. Patient is scheduled to have treatment on this Tuesday coming up. Patient is under the care of Dr. Jethro Bolus.      Marland Kitchen omeprazole (PRILOSEC) 40 MG capsule Take 40 mg by mouth daily.        . ondansetron (ZOFRAN) 8 MG tablet Take 8 mg by mouth every 12 (twelve) hours as needed.        . ondansetron (ZOFRAN-ODT) 8 MG disintegrating tablet       . prochlorperazine (COMPAZINE) 10 MG tablet Take 10 mg by mouth every 6 (six) hours as needed.        Marland Kitchen  promethazine (PHENERGAN) 25 MG suppository Place 25 mg rectally daily as needed.        . sitaGLIPtin (JANUVIA) 100 MG tablet Take 100 mg by mouth daily.        . sucralfate (CARAFATE) 1 GM/10ML suspension Take 1 g by mouth 4 (four) times daily. Patient only uses liquid when he is having trouble with swallowing tablets      . terazosin (HYTRIN) 5 MG capsule Take 5 mg by mouth at bedtime.        Marland Kitchen amLODipine (NORVASC) 10 MG tablet Take 10 mg by mouth daily.        Marland Kitchen HYDROcodone-acetaminophen (LORTAB) 7.5-500 MG/15ML solution Take 15 mLs by mouth every 6 (six) hours as needed.        Marland Kitchen oxyCODONE (OXY IR/ROXICODONE) 5 MG immediate release tablet        Current Facility-Administered Medications  Medication Dose Route Frequency Provider Last Rate Last Dose  . enoxaparin (LOVENOX) injection 120 mg  120 mg Subcutaneous  Q24H Jethro Bolus, MD   120 mg at 08/09/11 1010   Facility-Administered Medications Ordered in Other Visits  Medication Dose Route Frequency Provider Last Rate Last Dose  . enoxaparin (LOVENOX) injection 120 mg  120 mg Subcutaneous Q24H Jethro Bolus, MD   120 mg at 08/06/11 1609  . DISCONTD: enoxaparin (LOVENOX) injection 120 mg  120 mg Subcutaneous Q24H Jethro Bolus, MD   120 mg at 08/08/11 1628    ALLERGIES:   has no known allergies.  REVIEW OF SYSTEMS:  The rest of the 14-point review of system was negative.   Filed Vitals:   08/09/11 0907  BP: 96/63  Pulse: 112  Temp: 97.5 F (36.4 C)  ECOG 1-2.   Wt Readings from Last 3 Encounters:  08/09/11 155 lb 12.8 oz (70.67 kg)  07/29/11 176 lb 9.6 oz (80.105 kg)  07/23/11 175 lb 7.8 oz (79.6 kg)   ECOG Performance status: 1-2  PHYSICAL EXAMINATION:   General:  Thin, cachetically appearing male in no acute distress.  Eyes:  No  icterus.  ENT:  There were no oropharyngeal lesions.  Neck was without thyromegaly.  Lymphatics:  Negative cervical, supraclavicular or axillary adenopathy.  Respiratory: lungs were clear bilaterally without wheezing or crackles.  Cardiovascular:  Regular rate and rhythm, S1/S2, without murmur, rub or gallop.  There was no pedal edema.  GI:  abdomen was soft, flat, nontender, nondistended, without organomegaly.  His percutaneous biliary drainage tube without purulent discharge or tenderness.  Muscoloskeletal:  no spinal tenderness of palpation of vertebral spine.  Skin exam was without echymosis, petichae.  Neuro exam was nonfocal.  Patient was able to get on and off exam table without assistance.  Gait was normal.  Patient was alerted and oriented.  Attention was good.   Language was appropriate.  Mood was normal without depression.  Speech was not pressured.  Thought content was not tangential.     LABORATORY/RADIOLOGY DATA:  Lab Results  Component Value Date   WBC 16.1* 08/09/2011   HGB 10.9* 08/09/2011   HCT 33.0*  08/09/2011   PLT 170 08/09/2011   GLUCOSE 92 07/29/2011   GLUCOSE 92 07/29/2011   GLUCOSE 92 07/29/2011   ALT 47 07/29/2011   ALT 47 07/29/2011   ALT 47 07/29/2011   AST 41* 07/29/2011   AST 41* 07/29/2011   AST 41* 07/29/2011   NA 142 07/29/2011   NA 142 07/29/2011   NA 142 07/29/2011   K 3.0* 07/29/2011  K 3.0* 07/29/2011   K 3.0* 07/29/2011   CL 109 07/29/2011   CL 109 07/29/2011   CL 109 07/29/2011   CREATININE 1.17 07/29/2011   CREATININE 1.17 07/29/2011   CREATININE 1.17 07/29/2011   BUN 9 07/29/2011   BUN 9 07/29/2011   BUN 9 07/29/2011   CO2 21 07/29/2011   CO2 21 07/29/2011   CO2 21 07/29/2011   INR 1.78* 07/25/2011    Dg Chest Portable 1 View  07/22/2011  *RADIOLOGY REPORT*  Clinical Data: Fever.  PORTABLE CHEST - 1 VIEW  Comparison: CT of the chest performed 06/18/2011  Findings: The lungs are well aerated.  Minimal bibasilar opacities likely reflect atelectasis.  The known left basilar pulmonary nodule is not readily characterized on radiograph.  No pleural effusion or pneumothorax is seen.  The cardiomediastinal silhouette is normal in size.  A right-sided chest Mediport is noted ending at the distal SVC.  No acute osseous abnormalities are seen.  There is fusion along the coracoclavicular ligaments bilaterally.  Degenerative change is noted at the left acromioclavicular joint.  IMPRESSION: Minimal bibasilar airspace opacities likely reflect atelectasis; known small left basilar pulmonary nodule is not readily characterized on radiograph.  Original Report Authenticated By: Tonia Ghent, M.D.     ASSESSMENT AND PLAN:   1. Metastatic pancreatic cancer.  He is s/p 3 cycles of chemo with grade 2 fatigue, grade 3 cytopenia with neutropenic fever after cycle #2.  His dose was decreased after the 2nd cycle.  He wanted to proceed with cycle 4 next week with dose similar to cycle #3.   I went ahead and schedule for a staging CT chest/abd/pel after this cycle.  If his  fatigue worsens with this cycle, I may need to decrease the dose even further.  2. 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  Once his bilirubin is improved to be around 2, then I will refer him back to IR to see whether the percutaneous biliary drainage can be internalized.   3. Renal insufficiency:  It improved when he was in the hospital receiving IVF.  His Cr today is pending. 4. History of pulmonary embolism, subsegmental.  This asymptomatic.  He is on Lovenox 120 mg subcu daily until we restage him from the cancer standpoint.  We will see what is going on with the pulmonary embolism as well at that time.  He is in the donut hole and cannot pay for  the medication himself, so he is getting it here every day.  If his repeat CT chest shows resolution of the PE, we may go back to prophylactic dosing of Lononox.  5. Pedal edema: this is resolved today.  6. Calorie protein malnutrition.  This is secondary to the cancer and the chemotherapy.  Continue with Marinol at 5mg  PO BID.  This is already high dose.  I advised him to try Glucerna up to 3 cans daily.  He had try Ensure Plus and was concerned that his Glc was elevated to 200 range.  I refer [facility] for [facility] advise.  7. Oropharynx phlegm:  This is possibly from either dehydration or poor motility.  I advised him to increase his free water intake from only 16oz/day to around 50-60oz daily.  He would like to give a short trial of promotility drug Reglan.  I discussed with them potential side effects of Reglan which include but not limited to tremor. Patient expressed informed understanding of the risk and benefit of  the stated plan. Patient asked appropriate questions. Patient would like to go ahead with the Reglan.  8. Diabetes mellitus, type II.  He is on sitagliptin per PCP.  He checks his sugar once every day.  He may need insulin with his PCP if his Glc worsens with boost.  9. Nausea and vomiting prophylaxis.  He has  Compazine, Zofran, Reglan, and Ativan p.r.n.  10. Hypertension well-controlled on lisinopril per PCP.  11. Pancreatic insufficiency from pancreatic cancer.  He is on Creon 1 capsule 3 times a day.  I advised him to increase it to 2 capsules if he eats fatty food. 12. Code status.  Full code for now.  I discussed code status before in the past, however the patient has not had a chance to discuss with his wife yet.  With the restaging in December 2012, I will address this issue again. I discussed with patient and his wife today that his performance status has worsened on chemo.  He was adamant about continuing chemo.  I discussed the possibility of Palliative care consult for goal of care discussion.  He would like to defer this until after restaging CT.  I discussed with Dr. Derenda Mis who graciously agreed to potentially see patient on 08/26/11 after the restage CT.    Total length of time of the face-to-face encounter was 30 minutes.  More than 50% of time was spent in counseling and coordination of care.

## 2011-08-10 ENCOUNTER — Encounter (HOSPITAL_COMMUNITY): Payer: Self-pay | Admitting: Emergency Medicine

## 2011-08-10 ENCOUNTER — Other Ambulatory Visit: Payer: Self-pay | Admitting: Oncology

## 2011-08-10 ENCOUNTER — Ambulatory Visit: Payer: Medicare Other

## 2011-08-10 ENCOUNTER — Emergency Department (HOSPITAL_COMMUNITY): Payer: Medicare Other

## 2011-08-10 ENCOUNTER — Inpatient Hospital Stay (HOSPITAL_COMMUNITY)
Admission: EM | Admit: 2011-08-10 | Discharge: 2011-08-19 | DRG: 682 | Disposition: A | Discharge status: Home / Self Care | Payer: Medicare Other | Attending: Internal Medicine | Admitting: Internal Medicine

## 2011-08-10 DIAGNOSIS — N289 Disorder of kidney and ureter, unspecified: Secondary | ICD-10-CM

## 2011-08-10 DIAGNOSIS — I2699 Other pulmonary embolism without acute cor pulmonale: Secondary | ICD-10-CM | POA: Diagnosis present

## 2011-08-10 DIAGNOSIS — R531 Weakness: Secondary | ICD-10-CM | POA: Diagnosis present

## 2011-08-10 DIAGNOSIS — C7800 Secondary malignant neoplasm of unspecified lung: Secondary | ICD-10-CM | POA: Diagnosis present

## 2011-08-10 DIAGNOSIS — C787 Secondary malignant neoplasm of liver and intrahepatic bile duct: Secondary | ICD-10-CM | POA: Diagnosis present

## 2011-08-10 DIAGNOSIS — K8689 Other specified diseases of pancreas: Secondary | ICD-10-CM | POA: Diagnosis present

## 2011-08-10 DIAGNOSIS — R031 Nonspecific low blood-pressure reading: Secondary | ICD-10-CM | POA: Diagnosis not present

## 2011-08-10 DIAGNOSIS — K219 Gastro-esophageal reflux disease without esophagitis: Secondary | ICD-10-CM | POA: Diagnosis present

## 2011-08-10 DIAGNOSIS — C259 Malignant neoplasm of pancreas, unspecified: Secondary | ICD-10-CM | POA: Diagnosis present

## 2011-08-10 DIAGNOSIS — R627 Adult failure to thrive: Secondary | ICD-10-CM | POA: Diagnosis present

## 2011-08-10 DIAGNOSIS — E860 Dehydration: Secondary | ICD-10-CM

## 2011-08-10 DIAGNOSIS — D649 Anemia, unspecified: Secondary | ICD-10-CM | POA: Diagnosis present

## 2011-08-10 DIAGNOSIS — N400 Enlarged prostate without lower urinary tract symptoms: Secondary | ICD-10-CM | POA: Diagnosis present

## 2011-08-10 DIAGNOSIS — D72829 Elevated white blood cell count, unspecified: Secondary | ICD-10-CM | POA: Diagnosis present

## 2011-08-10 DIAGNOSIS — R066 Hiccough: Secondary | ICD-10-CM

## 2011-08-10 DIAGNOSIS — Z7982 Long term (current) use of aspirin: Secondary | ICD-10-CM

## 2011-08-10 DIAGNOSIS — A419 Sepsis, unspecified organism: Secondary | ICD-10-CM

## 2011-08-10 DIAGNOSIS — F3289 Other specified depressive episodes: Secondary | ICD-10-CM | POA: Diagnosis present

## 2011-08-10 DIAGNOSIS — R64 Cachexia: Secondary | ICD-10-CM | POA: Diagnosis present

## 2011-08-10 DIAGNOSIS — R634 Abnormal weight loss: Secondary | ICD-10-CM

## 2011-08-10 DIAGNOSIS — Z79899 Other long term (current) drug therapy: Secondary | ICD-10-CM

## 2011-08-10 DIAGNOSIS — R5383 Other fatigue: Secondary | ICD-10-CM | POA: Diagnosis present

## 2011-08-10 DIAGNOSIS — R6251 Failure to thrive (child): Secondary | ICD-10-CM | POA: Diagnosis present

## 2011-08-10 DIAGNOSIS — K859 Acute pancreatitis without necrosis or infection, unspecified: Secondary | ICD-10-CM

## 2011-08-10 DIAGNOSIS — K649 Unspecified hemorrhoids: Secondary | ICD-10-CM

## 2011-08-10 DIAGNOSIS — E119 Type 2 diabetes mellitus without complications: Secondary | ICD-10-CM | POA: Diagnosis present

## 2011-08-10 DIAGNOSIS — N179 Acute kidney failure, unspecified: Principal | ICD-10-CM | POA: Diagnosis present

## 2011-08-10 DIAGNOSIS — E876 Hypokalemia: Secondary | ICD-10-CM | POA: Diagnosis present

## 2011-08-10 DIAGNOSIS — R5381 Other malaise: Secondary | ICD-10-CM | POA: Diagnosis present

## 2011-08-10 DIAGNOSIS — E46 Unspecified protein-calorie malnutrition: Secondary | ICD-10-CM | POA: Diagnosis present

## 2011-08-10 DIAGNOSIS — Z800 Family history of malignant neoplasm of digestive organs: Secondary | ICD-10-CM

## 2011-08-10 DIAGNOSIS — R1310 Dysphagia, unspecified: Secondary | ICD-10-CM | POA: Diagnosis present

## 2011-08-10 DIAGNOSIS — R630 Anorexia: Secondary | ICD-10-CM

## 2011-08-10 DIAGNOSIS — K838 Other specified diseases of biliary tract: Secondary | ICD-10-CM | POA: Diagnosis present

## 2011-08-10 DIAGNOSIS — I10 Essential (primary) hypertension: Secondary | ICD-10-CM | POA: Diagnosis present

## 2011-08-10 DIAGNOSIS — D709 Neutropenia, unspecified: Secondary | ICD-10-CM

## 2011-08-10 DIAGNOSIS — F329 Major depressive disorder, single episode, unspecified: Secondary | ICD-10-CM | POA: Diagnosis present

## 2011-08-10 LAB — CBC
Hemoglobin: 8.7 g/dL — ABNORMAL LOW (ref 13.0–17.0)
MCH: 26.8 pg (ref 26.0–34.0)
RBC: 3.25 MIL/uL — ABNORMAL LOW (ref 4.22–5.81)

## 2011-08-10 LAB — URINALYSIS, ROUTINE W REFLEX MICROSCOPIC
Nitrite: POSITIVE — AB
Urobilinogen, UA: 0.2 mg/dL (ref 0.0–1.0)

## 2011-08-10 LAB — COMPREHENSIVE METABOLIC PANEL
ALT: 28 U/L (ref 0–53)
Alkaline Phosphatase: 188 U/L — ABNORMAL HIGH (ref 39–117)
CO2: 21 mEq/L (ref 19–32)
Chloride: 100 mEq/L (ref 96–112)
GFR calc Af Amer: 31 mL/min — ABNORMAL LOW (ref >90)
GFR calc non Af Amer: 27 mL/min — ABNORMAL LOW (ref >90)
Glucose, Bld: 135 mg/dL — ABNORMAL HIGH (ref 70–99)
Potassium: 3.1 mEq/L — ABNORMAL LOW (ref 3.5–5.1)
Sodium: 138 mEq/L (ref 135–145)
Total Bilirubin: 1.7 mg/dL — ABNORMAL HIGH (ref 0.3–1.2)

## 2011-08-10 LAB — DIFFERENTIAL
Eosinophils Absolute: 0 10*3/uL (ref 0.0–0.7)
Lymphocytes Relative: 7 % — ABNORMAL LOW (ref 12–46)
Lymphs Abs: 1.4 10*3/uL (ref 0.7–4.0)
Monocytes Relative: 10 % (ref 3–12)
Neutrophils Relative %: 83 % — ABNORMAL HIGH (ref 43–77)

## 2011-08-10 MED ORDER — PIPERACILLIN-TAZOBACTAM 3.375 G IVPB
3.3750 g | Freq: Three times a day (TID) | INTRAVENOUS | Status: DC
  Administered 2011-08-11 – 2011-08-15 (×14): 3.375 g via INTRAVENOUS
  Filled 2011-08-10 (×17): qty 50

## 2011-08-10 MED ORDER — BACLOFEN 5 MG HALF TABLET
5.0000 mg | ORAL_TABLET | Freq: Three times a day (TID) | ORAL | Status: DC
  Administered 2011-08-10 – 2011-08-19 (×26): 5 mg via ORAL
  Filled 2011-08-10 (×31): qty 1

## 2011-08-10 MED ORDER — PIPERACILLIN-TAZOBACTAM 3.375 G IVPB 30 MIN
3.3750 g | Freq: Once | INTRAVENOUS | Status: AC
  Administered 2011-08-10: 3.375 g via INTRAVENOUS
  Filled 2011-08-10: qty 50

## 2011-08-10 MED ORDER — SODIUM CHLORIDE 0.9 % IV SOLN
INTRAVENOUS | Status: DC
  Administered 2011-08-10 – 2011-08-13 (×5): via INTRAVENOUS

## 2011-08-10 MED ORDER — SODIUM CHLORIDE 0.9 % IJ SOLN
10.0000 mL | INTRAMUSCULAR | Status: DC | PRN
  Administered 2011-08-11 – 2011-08-19 (×8): 10 mL

## 2011-08-10 MED ORDER — SUCRALFATE 1 G PO TABS
1.0000 g | ORAL_TABLET | Freq: Four times a day (QID) | ORAL | Status: DC
  Administered 2011-08-10 – 2011-08-19 (×35): 1 g via ORAL
  Filled 2011-08-10 (×43): qty 1

## 2011-08-10 MED ORDER — TERAZOSIN HCL 5 MG PO CAPS
5.0000 mg | ORAL_CAPSULE | Freq: Every day | ORAL | Status: DC
  Administered 2011-08-10 – 2011-08-18 (×8): 5 mg via ORAL
  Filled 2011-08-10 (×11): qty 1

## 2011-08-10 MED ORDER — CHLORPROMAZINE HCL 25 MG PO TABS
25.0000 mg | ORAL_TABLET | Freq: Three times a day (TID) | ORAL | Status: DC | PRN
  Administered 2011-08-10: 25 mg via ORAL
  Filled 2011-08-10 (×2): qty 1

## 2011-08-10 MED ORDER — ENOXAPARIN SODIUM 80 MG/0.8ML ~~LOC~~ SOLN
70.0000 mg | Freq: Every day | SUBCUTANEOUS | Status: DC
  Administered 2011-08-10 – 2011-08-12 (×3): 70 mg via SUBCUTANEOUS
  Filled 2011-08-10 (×4): qty 0.8

## 2011-08-10 MED ORDER — SODIUM CHLORIDE 0.9 % IV BOLUS (SEPSIS)
1000.0000 mL | Freq: Once | INTRAVENOUS | Status: AC
  Administered 2011-08-10: 1000 mL via INTRAVENOUS

## 2011-08-10 NOTE — ED Notes (Signed)
Pt medicated for hiccups.

## 2011-08-10 NOTE — Progress Notes (Deleted)
Hospital Admission Note Date: 08/10/2011  Patient name: Mitchell Hines           Medical record number: [id number] Date of birth: [birth year]           Age: 70 y.o.   Gender: male    PCP:   Dartha Lodge, MD   Chief Complaint:  Generalized weakness  HPI: Mitchell Hines is a 70 y.o. male with past medical history of stage IV pancreatic cancer, diabetes mellitus type 2 and previous history of neutropenia secondary to chemotherapy. Patient is undergoing chemotherapy with Dr. Gaylyn Rong. He came into the hospital complaining about generalized weakness and poor oral intake. He saw Dr. Charmian Muff yesterday in his office. And according to him he did have troubles eating, drinking and ambulating. Patient weakness was getting worse this morning so he decided to come to the emergency department when he couldn't get off of the bed. Upon initial evaluation in the emergency department patient was found to have acute renal failure with a creatinine of 2.3. His creatinine about 2 weeks ago was 1.1. Patient also was found to have leukocytosis with WBCs count of 20.6 K. and absolute neutrophil counts of 17.2 K. patient will be admitted to the hospital for further evaluation  Past Medical History: Past Medical History  Diagnosis Date  . Pancreas cancer 2012  . Cholestasis 2012  . Liver metastases 2012  . Lung metastases 2012  . BPH (benign prostatic hyperplasia) 2012    with Dr. Alfredo Martinez  . Pulmonary embolism 2012  . Weight loss, non-intentional 2012  . Poor appetite 2012  . GERD (gastroesophageal reflux disease) 06/29/2011  . Pancreatic insufficiency 06/29/2011  . Hypertension 06/29/2011  . Hemorrhoid 06/29/2011  . Protein calorie malnutrition   . Neutropenia   . UTI (lower urinary tract infection)   . Diabetes mellitus     oral   Past Surgical History  Procedure Date  . Portacath placement 06/10/11 Powerport    Tip in lower SVC per Dr. Lowella Dandy  . Percutaneous external biliary drain placement 06/07/11     Medications: Prior to Admission medications   Medication Sig Start Date End Date Taking? Authorizing Provider  amLODipine (NORVASC) 10 MG tablet Take 5 mg by mouth daily.    Yes Historical Provider, MD  aspirin 81 MG chewable tablet Chew 81 mg by mouth daily.     Yes Historical Provider, MD  baclofen (LIORESAL) 10 MG tablet Take 5 mg by mouth 3 (three) times daily. For hiccups   Yes Historical Provider, MD  dronabinol (MARINOL) 2.5 MG capsule Take 2.5 mg by mouth 2 (two) times daily before a meal.     Yes Historical Provider, MD  HYDROcodone-acetaminophen (LORTAB) 7.5-500 MG/15ML solution Take 15 mLs by mouth every 6 (six) hours as needed. For pain   Yes Jethro Bolus, MD  lipase/protease/amylase (CREON-10/PANCREASE) 12000 UNITS CPEP Take 2 capsules by mouth 3 (three) times daily before meals. Take 1 capsule before meals and if patient plans to eat a fatty food he will take 2 more capsules   Yes Historical Provider, MD  metoCLOPramide (REGLAN) 10 MG tablet Take 1 tablet (10 mg total) by mouth 3 (three) times daily as needed (nausea/vomting; gagging or GI dysmotility. ). 08/09/11 08/19/11 Yes Jethro Bolus, MD  NON FORMULARY Chemotherapy is done here at the cancer center twice a month on Tuesday. Patient is scheduled to have treatment on this Tuesday coming up. Patient is under the care of Dr. Jethro Bolus.   Yes Historical Provider, MD  omeprazole (PRILOSEC) 40 MG capsule Take 40 mg by mouth daily.     Yes Historical Provider, MD  ondansetron (ZOFRAN) 8 MG tablet Take 8 mg by mouth every 12 (twelve) hours as needed.     Yes Historical Provider, MD  ondansetron (ZOFRAN-ODT) 8 MG disintegrating tablet Take 8 mg by mouth every 8 (eight) hours as needed. For nausea  06/19/11  Yes Historical Provider, MD  potassium chloride 20 MEQ/15ML (10%) solution Take 30 mLs (40 mEq total) by mouth 2 (two) times daily. 08/09/11 09/08/11 Yes Jethro Bolus, MD  prochlorperazine (COMPAZINE) 10 MG tablet Take 10 mg by mouth every 6 (six)  hours as needed. For nausea   Yes Historical Provider, MD  sitaGLIPtin (JANUVIA) 100 MG tablet Take 100 mg by mouth daily.     Yes Historical Provider, MD  sucralfate (CARAFATE) 1 G tablet Take 1 g by mouth 4 (four) times daily.     Yes Historical Provider, MD  terazosin (HYTRIN) 5 MG capsule Take 5 mg by mouth at bedtime.     Yes Historical Provider, MD    Allergies:  No Known Allergies  Social History:  reports that he quit smoking about 30 years ago. His smoking use included Cigarettes. He has a 30 pack-year smoking history. He has never used smokeless tobacco. He reports that he does not drink alcohol or use illicit drugs.  Family History: Family History  Problem Relation Age of Onset  . Cervical cancer Mother   . Diabetes type II Mother   . Stroke Father   . Pancreatic cancer Brother   . Coronary artery disease Brother     Review of Systems:  Constitutional: negative for anorexia, fevers and sweats Eyes: negative for irritation, redness and visual disturbance Ears, nose, mouth, throat, and face: negative for earaches, epistaxis, nasal congestion and sore throat Respiratory: negative for cough, dyspnea on exertion, sputum and wheezing Cardiovascular: negative for chest pain, dyspnea, lower extremity edema, orthopnea, palpitations and syncope Gastrointestinal: negative for abdominal pain, constipation, diarrhea, melena, nausea and vomiting Genitourinary:negative for dysuria, frequency and hematuria Hematologic/lymphatic: negative for bleeding, easy bruising and lymphadenopathy Musculoskeletal:negative for arthralgias, muscle weakness and stiff joints Neurological: negative for coordination problems, gait problems, headaches and weakness Endocrine: negative for diabetic symptoms including polydipsia, polyuria and weight loss Allergic/Immunologic: negative for anaphylaxis, hay fever and urticaria  Physical Exam: BP 116/62  Pulse 92  Temp(Src) 98.1 F (36.7 C) (Oral)  Resp 16   SpO2 100% General appearance: alert, cooperative and no distress  Head: Normocephalic, without obvious abnormality, atraumatic  Eyes: conjunctivae/corneas clear. PERRL, EOM's intact. Fundi benign.  Nose: Nares normal. Septum midline. Mucosa normal. No drainage or sinus tenderness.  Throat: lips, mucosa, and tongue normal; teeth and gums normal  Neck: Supple, no masses, no cervical lymphadenopathy, no JVD appreciated, no meningeal signs Resp: clear to auscultation bilaterally  Chest wall: no tenderness  Cardio: regular rate and rhythm, S1, S2 normal, no murmur, click, rub or gallop  GI: soft, non-tender; bowel sounds normal; no masses, no organomegaly  Extremities: extremities normal, atraumatic, no cyanosis or edema  Skin: Skin color, texture, turgor normal. No rashes or lesions  Neurologic: Alert and oriented X 3, normal strength and tone. Normal symmetric reflexes. Normal coordination and gait  Labs on Admission:   [facility] 08/10/11 1240 08/09/11 0849  NA 138 140  K 3.1* 2.8*  CL 100 101  CO2 21 19  GLUCOSE 135* 117*  BUN 30* 23  CREATININE 2.30* 1.94*  CALCIUM 8.8  8.9  MG -- --  PHOS -- --    Basename 08/10/11 1240 08/09/11 0849  AST 35 39*  ALT 28 28  ALKPHOS 188* 215*  BILITOT 1.7* 2.1*  PROT 6.7 7.0  ALBUMIN 2.9* 3.5    Basename 08/10/11 1240  LIPASE 418*  AMYLASE --    Basename 08/10/11 1240 08/09/11 0849  WBC 20.6* 16.1*  NEUTROABS 17.2* 11.7*  HGB 8.7* 10.9*  HCT 27.0* 33.0*  MCV 83.1 83.2  PLT 152 170   Radiological Exams on Admission: Dg Chest 2 View 08/10/2011  *RADIOLOGY REPORT*  Findings: Mild cardiomegaly is unchanged.  The mediastinum and pulmonary vasculature are within normal limits.  There is a right subclavian Powerport with the tip at the cavoatrial junction.  Both lungs are clear.  There is tubing coiled over the abdomen on the lateral view.   IMPRESSION: Stable chest x-ray with no evidence of acute cardiac or pulmonary process.       IMPRESSION: Present on Admission:  .Acute renal failure .Generalized weakness .Leukocytosis .Failure to thrive .Pancreas cancer .ARF (acute renal failure)  Assessment/Plan  1. Acute renal failure: This is probably secondary to the poor oral intake and dehydration. Patient will be aggressively hydrated with IV fluids. His renal function will be followed daily with a BMP. Also in the nephrotoxic medications will be discontinued.  2. Leukocytosis: Patient reported last time she had chemotherapy was about 2 weeks ago and he could not remember if he was getting Neupogen or not. His white blood count is in the high side with neutrophilia. I will rule out a hidden infection and sepsis. Will get pro-calcitonin and lactic acid levels. Blood cultures obtained already in the emergency department patient will be started on Zosyn. Chest x-ray and a UA as well as biliary culture will be obtained. Please note the patient has biliary drain, but he does not have any tenderness or fever to suggest cholangitis.  3. Generalized weakness: This is probably multifactorial secondary to his cancer, renal failure, and poor oral intake. We'll obtain PT to evaluate him.  4. History of DVT: Patient is on therapeutic Lovenox and that will be restarted and pharmacy will be consulted to adjust the dosage.  5. 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 Patient is following with Dr. Gaylyn Rong. Patient had 3 cycles of chemotherapy he supposed to get chemotherapy next week in a restaging CT scan.  6. Transaminitis: This is secondary probably to a extrinsic biliary drain pulse of the liver metastasis. Patient does not have tenderness or RUQ pain. His total bilirubin also is tending down. So cholangitis very unlikely.  7. Diabetes mellitus type 2: I think this is the least of his concerns now. Patient on carbohydrate modified diet put him on insulin sliding scale. His home medication will be  continued.    Mitchell Hines A 08/10/2011, 4:46 PM

## 2011-08-10 NOTE — ED Notes (Signed)
MD at bedside. 

## 2011-08-10 NOTE — ED Notes (Signed)
Family at bedside. 

## 2011-08-10 NOTE — ED Notes (Signed)
Vital signs stable. 

## 2011-08-10 NOTE — Progress Notes (Signed)
ANTICOAGULATION CONSULT NOTE - Initial Consult  Pharmacy Consult for Lovenox Indication: Hx DVT  No Known Allergies  Patient Measurements: Height: 6' 0.05" (183 cm) Weight: 155 lb (70.308 kg) IBW/kg (Calculated) : 77.71    Vital Signs: Temp: 98.2 F (36.8 C) (12/01 1807) Temp src: Oral (12/01 1807) BP: 123/58 mmHg (12/01 1807) Pulse Rate: 74  (12/01 1807)  Labs:  Basename 08/10/11 1240 08/09/11 0849  HGB 8.7* 10.9*  HCT 27.0* 33.0*  PLT 152 170  APTT -- --  LABPROT -- --  INR -- --  HEPARINUNFRC -- --  CREATININE 2.30* 1.94*  CKTOTAL -- --  CKMB -- --  TROPONINI -- --   Estimated Creatinine Clearance: 29.7 ml/min (by C-G formula based on Cr of 2.3).  Medical History: Past Medical History  Diagnosis Date  . Pancreas cancer 2012  . Cholestasis 2012  . Liver metastases 2012  . Lung metastases 2012  . BPH (benign prostatic hyperplasia) 2012    with Dr. Alfredo Martinez  . Pulmonary embolism 2012  . Weight loss, non-intentional 2012  . Poor appetite 2012  . GERD (gastroesophageal reflux disease) 06/29/2011  . Pancreatic insufficiency 06/29/2011  . Hypertension 06/29/2011  . Hemorrhoid 06/29/2011  . Protein calorie malnutrition   . Neutropenia   . UTI (lower urinary tract infection)   . Diabetes mellitus     oral    Medications:  Scheduled:    . enoxaparin  70 mg Subcutaneous QHS  . piperacillin-tazobactam  3.375 g Intravenous Once  . piperacillin-tazobactam (ZOSYN)  IV  3.375 g Intravenous Q8H  . sodium chloride  1,000 mL Intravenous Once    Assessment: 70 yo M w/hx DVT. On Lovenox 120mg  sq daily PTA, last dose at [facility] yesterday morning. Pharmacy to dose Lovenox while in hospital.    Plan:  Lovenox 70mg  sq q24h (1mg /kg) due to renal insufficiency. Follow Scr, adjust dose as necessary.  Gwen Her 08/10/2011,6:37 PM

## 2011-08-10 NOTE — ED Provider Notes (Signed)
History     CSN: [id number] Arrival date & time: 08/10/2011 10:50 AM   First MD Initiated Contact with Patient 08/10/11 1131      Chief Complaint  Patient presents with  . Weakness    near syncope    (Consider location/radiation/quality/duration/timing/severity/associated sxs/prior treatment) Patient is a 70 y.o. male presenting with weakness. The history is provided by the patient and a relative.  Weakness Primary symptoms do not include headaches, fever, nausea or vomiting.  Additional symptoms include weakness.   H&H is a 70 year old male, with known pancreatic cancer.  He was last treated with chemotherapy 2 weeks ago.  He states that he has been weak since this morning and has a nonproductive cough.  He denies pain anywhere.  He has not had nausea, vomiting, diaphoresis.  He denies fevers, chills, urinary tract symptoms, or diarrhea.  His daughter says that he was recently treated for urinary tract infection.    Past Medical History  Diagnosis Date  . Pancreas cancer 2012  . Cholestasis 2012  . Liver metastases 2012  . Lung metastases 2012  . BPH (benign prostatic hyperplasia) 2012    with Dr. Alfredo Martinez  . Pulmonary embolism 2012  . Weight loss, non-intentional 2012  . Poor appetite 2012  . GERD (gastroesophageal reflux disease) 06/29/2011  . Pancreatic insufficiency 06/29/2011  . Hypertension 06/29/2011  . Hemorrhoid 06/29/2011  . Protein calorie malnutrition   . Neutropenia   . UTI (lower urinary tract infection)   . Diabetes mellitus     oral    Past Surgical History  Procedure Date  . Portacath placement 06/10/11 Powerport    Tip in lower SVC per Dr. Lowella Dandy  . Percutaneous external biliary drain placement 06/07/11    Family History  Problem Relation Age of Onset  . Cervical cancer Mother   . Diabetes type II Mother   . Stroke Father   . Pancreatic cancer Brother   . Coronary artery disease Brother     History  Substance Use Topics  . Smoking  status: Former Smoker -- 1.0 packs/day for 30 years    Types: Cigarettes    Quit date: 07/21/1981  . Smokeless tobacco: Never Used  . Alcohol Use: No      Review of Systems  Constitutional: Negative for fever, chills and diaphoresis.  HENT: Negative for neck pain.   Eyes: Negative for redness.  Respiratory: Positive for cough. Negative for chest tightness and shortness of breath.   Cardiovascular: Negative for chest pain and palpitations.  Gastrointestinal: Negative for nausea, vomiting, abdominal pain, diarrhea and constipation.  Genitourinary: Negative for dysuria.  Musculoskeletal: Negative for back pain.  Neurological: Positive for weakness. Negative for headaches.  Psychiatric/Behavioral: Negative for confusion.    Allergies  Review of patient's allergies indicates no known allergies.  Home Medications   Current Outpatient Rx  Name Route Sig Dispense Refill  . AMLODIPINE BESYLATE 10 MG PO TABS Oral Take 5 mg by mouth daily.     . ASPIRIN 81 MG PO CHEW Oral Chew 81 mg by mouth daily.      Marland Kitchen BACLOFEN 10 MG PO TABS Oral Take 5 mg by mouth 3 (three) times daily. For hiccups    . DRONABINOL 2.5 MG PO CAPS Oral Take 2.5 mg by mouth 2 (two) times daily before a meal.      . HYDROCODONE-ACETAMINOPHEN 7.5-500 MG/15ML PO SOLN Oral Take 15 mLs by mouth every 6 (six) hours as needed. For pain    .  PANCRELIPASE (LIP-PROT-AMYL) 12000 UNITS PO CPEP Oral Take 2 capsules by mouth 3 (three) times daily before meals. Take 1 capsule before meals and if patient plans to eat a fatty food he will take 2 more capsules    . METOCLOPRAMIDE HCL 10 MG PO TABS Oral Take 1 tablet (10 mg total) by mouth 3 (three) times daily as needed (nausea/vomting; gagging or GI dysmotility. ). 30 tablet 0  . NON FORMULARY  Chemotherapy is done here at the cancer center twice a month on Tuesday. Patient is scheduled to have treatment on this Tuesday coming up. Patient is under the care of Dr. Jethro Bolus.    Marland Kitchen OMEPRAZOLE  40 MG PO CPDR Oral Take 40 mg by mouth daily.      Marland Kitchen ONDANSETRON HCL 8 MG PO TABS Oral Take 8 mg by mouth every 12 (twelve) hours as needed.      Marland Kitchen ONDANSETRON 8 MG PO TBDP Oral Take 8 mg by mouth every 8 (eight) hours as needed. For nausea     . POTASSIUM CHLORIDE 20 MEQ/15ML (10%) PO LIQD Oral Take 30 mLs (40 mEq total) by mouth 2 (two) times daily. 500 mL 3  . PROCHLORPERAZINE MALEATE 10 MG PO TABS Oral Take 10 mg by mouth every 6 (six) hours as needed. For nausea    . SITAGLIPTIN PHOSPHATE 100 MG PO TABS Oral Take 100 mg by mouth daily.      . SUCRALFATE 1 G PO TABS Oral Take 1 g by mouth 4 (four) times daily.      Marland Kitchen TERAZOSIN HCL 5 MG PO CAPS Oral Take 5 mg by mouth at bedtime.        BP 116/62  Pulse 92  Temp(Src) 98.1 F (36.7 C) (Oral)  Resp 16  SpO2 100%  Physical Exam  Constitutional: He is oriented to person, place, and time. He appears well-developed and well-nourished.       Listless  HENT:  Head: Normocephalic and atraumatic.  Eyes: EOM are normal. Pupils are equal, round, and reactive to light. No scleral icterus.  Neck: Normal range of motion. Neck supple.  Cardiovascular: Normal rate and regular rhythm.   Murmur heard. Pulmonary/Chest: Effort normal and breath sounds normal. No respiratory distress. He has no wheezes. He has no rales.  Abdominal: Soft. Bowel sounds are normal. He exhibits no distension and no mass. There is no tenderness. There is no rebound and no guarding.       Drain in the right side of his abdomen, which she states goes into his intestinal tract  Musculoskeletal: Normal range of motion.  Neurological: He is alert and oriented to person, place, and time. No cranial nerve deficit.  Skin: Skin is warm and dry. He is not diaphoretic.  Psychiatric: He has a normal mood and affect. His behavior is normal.    ED Course  Procedures (including critical care time)  70 year old male, with pancreatic cancer, presents with a nonproductive cough, and  weakness.  We will perform a chest x-ray, and laboratory testing, to determine whether or not.  He has evidence of an infection or other electrolyte abnormality causing him to be weak.  Labs Reviewed  CBC - Abnormal; Notable for the following:    WBC 20.6 (*)    RBC 3.25 (*)    Hemoglobin 8.7 (*)    HCT 27.0 (*)    RDW 19.4 (*)    All other components within normal limits  DIFFERENTIAL - Abnormal; Notable for the  following:    Neutrophils Relative 83 (*)    Neutro Abs 17.2 (*)    Lymphocytes Relative 7 (*)    Monocytes Absolute 2.0 (*)    All other components within normal limits  LIPASE, BLOOD - Abnormal; Notable for the following:    Lipase 418 (*)    All other components within normal limits  COMPREHENSIVE METABOLIC PANEL - Abnormal; Notable for the following:    Potassium 3.1 (*)    Glucose, Bld 135 (*)    BUN 30 (*)    Creatinine, Ser 2.30 (*)    Albumin 2.9 (*)    Alkaline Phosphatase 188 (*)    Total Bilirubin 1.7 (*)    GFR calc non Af Amer 27 (*)    GFR calc Af Amer 31 (*)    All other components within normal limits  URINALYSIS, ROUTINE W REFLEX MICROSCOPIC   Dg Chest 2 View  08/10/2011  *RADIOLOGY REPORT*  Clinical Data: Cough and weakness; on chemotherapy for cancer  CHEST - 2 VIEW  Comparison: July 22, 2011  Findings: Mild cardiomegaly is unchanged.  The mediastinum and pulmonary vasculature are within normal limits.  There is a right subclavian Powerport with the tip at the cavoatrial junction.  Both lungs are clear.  There is tubing coiled over the abdomen on the lateral view.  IMPRESSION: Stable chest x-ray with no evidence of acute cardiac or pulmonary process.  Original Report Authenticated By: Brandon Melnick, M.D.    4:04 PM Spoke with the [facility] hospitalist.  He will admit the patient to the hospital.    MDM    Pancreatitis Dehydration Renal insufficiency Leukocytosis       Nicholes Stairs, MD 08/10/11 1605

## 2011-08-10 NOTE — ED Notes (Signed)
Patient denies pain and is resting comfortably.  

## 2011-08-10 NOTE — ED Notes (Signed)
Pt in via ems with weakness and near syncope states cbg on arrival 173 states initial BP 63 systolic after fluids 125/70 pt has a hx of pancreatic ca

## 2011-08-10 NOTE — ED Notes (Signed)
Pt. Given urinal. Unable to void. Given 240 mL of water and advised would revisit in 20 mins. RN Notified.

## 2011-08-10 NOTE — Progress Notes (Signed)
ANTIBIOTIC CONSULT NOTE - INITIAL  Pharmacy Consult for Zosyn Indication: Leukocytosis   No Known Allergies  Patient Measurements: Height: 6' 0.05" (183 cm) Weight: 155 lb 13.8 oz (70.7 kg) IBW/kg (Calculated) : 77.71  Adjusted Body Weight:   Vital Signs: Temp: 98.1 F (36.7 C) (12/01 1101) Temp src: Oral (12/01 1101) BP: 116/62 mmHg (12/01 1101) Pulse Rate: 92  (12/01 1101) Intake/Output from previous day:   Intake/Output from this shift:    Labs:  [facility] 08/10/11 1240 08/09/11 0849  WBC 20.6* 16.1*  HGB 8.7* 10.9*  PLT 152 170  LABCREA -- --  CREATININE 2.30* 1.94*   Estimated Creatinine Clearance: 29.9 ml/min (by C-G formula based on Cr of 2.3). No results found for this basename: VANCOTROUGH:2,VANCOPEAK:2,VANCORANDOM:2,GENTTROUGH:2,GENTPEAK:2,GENTRANDOM:2,TOBRATROUGH:2,TOBRAPEAK:2,TOBRARND:2,AMIKACINPEAK:2,AMIKACINTROU:2,AMIKACIN:2, in the last 72 hours   Microbiology: Recent Results (from the past 720 hour(s))  CULTURE, BLOOD (ROUTINE X 2)     Status: Normal   Collection Time   07/22/11  4:01 AM      Component Value Range Status Comment   Specimen Description BLOOD PORTACATH   Final    Special Requests BOTTLES DRAWN AEROBIC AND ANAEROBIC 5CC   Final    Setup Time [id number]   Final    Culture NO GROWTH 5 DAYS   Final    Report Status 07/28/2011 FINAL   Final   PATHOLOGIST SMEAR REVIEW     Status: Normal   Collection Time   07/22/11  4:01 AM      Component Value Range Status Comment   Tech Review Reviewed By Havery Moros, M.D.   Final   CULTURE, BLOOD (ROUTINE X 2)     Status: Normal   Collection Time   07/22/11  4:22 AM      Component Value Range Status Comment   Specimen Description BLOOD Hines HAND   Final    Special Requests BOTTLES DRAWN AEROBIC AND ANAEROBIC 5CC   Final    Setup Time [id number]   Final    Culture NO GROWTH 5 DAYS   Final    Report Status 07/28/2011 FINAL   Final   URINE CULTURE     Status: Normal   Collection Time   07/22/11  4:55 AM      Component Value Range Status Comment   Specimen Description URINE, RANDOM   Final    Special Requests NONE   Final    Setup Time [id number]   Final    Colony Count 8,000 COLONIES/ML   Final    Culture INSIGNIFICANT GROWTH   Final    Report Status 07/23/2011 FINAL   Final   CULTURE, SPUTUM-ASSESSMENT     Status: Normal   Collection Time   07/23/11 11:42 PM      Component Value Range Status Comment   Specimen Description SPUTUM   Final    Special Requests Immunocompromised   Final    Sputum evaluation     Final    Value: MICROSCOPIC FINDINGS SUGGEST THAT THIS SPECIMEN IS NOT REPRESENTATIVE OF LOWER RESPIRATORY SECRETIONS. PLEASE RECOLLECT.     CALLED TO J DODSON RN AT 0140 07/24/2011 BY R LINEBERRY   Report Status 07/24/2011 FINAL   Final   CULTURE, SPUTUM-ASSESSMENT     Status: Normal   Collection Time   07/24/11  9:13 AM      Component Value Range Status Comment   Specimen Description SPUTUM   Final    Special Requests Immunocompromised   Final    Sputum evaluation  Final    Value: MICROSCOPIC FINDINGS SUGGEST THAT THIS SPECIMEN IS NOT REPRESENTATIVE OF LOWER RESPIRATORY SECRETIONS. PLEASE RECOLLECT.     Mitchell MACABAUG RN AT 0940 ON 07/24/11 BY GILLESPIE,B.   Report Status 07/24/2011 FINAL   Final   CULTURE, SPUTUM-ASSESSMENT     Status: Normal   Collection Time   07/24/11 11:30 PM      Component Value Range Status Comment   Specimen Description SPUTUM   Final    Special Requests Normal   Final    Sputum evaluation     Final    Value: MICROSCOPIC FINDINGS SUGGEST THAT THIS SPECIMEN IS NOT REPRESENTATIVE OF LOWER RESPIRATORY SECRETIONS. PLEASE RECOLLECT.     CALLED TO WEAVER,S/3E @2355  ON 07/24/11 BY KARCZEWSKI,S.   Report Status 07/24/2011 FINAL   Final     Medical History: Past Medical History  Diagnosis Date  . Pancreas cancer 2012  . Cholestasis 2012  . Liver metastases 2012  . Lung metastases 2012  . BPH (benign prostatic hyperplasia)  2012    with Dr. Alfredo Martinez  . Pulmonary embolism 2012  . Weight loss, non-intentional 2012  . Poor appetite 2012  . GERD (gastroesophageal reflux disease) 06/29/2011  . Pancreatic insufficiency 06/29/2011  . Hypertension 06/29/2011  . Hemorrhoid 06/29/2011  . Protein calorie malnutrition   . Neutropenia   . UTI (lower urinary tract infection)   . Diabetes mellitus     oral    Medications:  Anti-infectives    None     Assessment: 70 yo male, leukocytosis, last chemo 11/2 Productive cough Renal dysfunction  Goal of Therapy:  Zosyn dose/schedule appropriate for renal fx.  Plan:  Zosyn 3.375 gm q8h,  Adjust Zosyn if Cl falls below 20 ml/min Follow up culture results  Mitchell Hines 08/10/2011,5:30 PM

## 2011-08-11 DIAGNOSIS — C253 Malignant neoplasm of pancreatic duct: Secondary | ICD-10-CM

## 2011-08-11 DIAGNOSIS — E46 Unspecified protein-calorie malnutrition: Secondary | ICD-10-CM

## 2011-08-11 DIAGNOSIS — N289 Disorder of kidney and ureter, unspecified: Secondary | ICD-10-CM

## 2011-08-11 DIAGNOSIS — K838 Other specified diseases of biliary tract: Secondary | ICD-10-CM

## 2011-08-11 LAB — HEMOGLOBIN A1C: Mean Plasma Glucose: 157 mg/dL — ABNORMAL HIGH (ref <117)

## 2011-08-11 LAB — COMPREHENSIVE METABOLIC PANEL
Albumin: 2.7 g/dL — ABNORMAL LOW (ref 3.5–5.2)
BUN: 30 mg/dL — ABNORMAL HIGH (ref 6–23)
CO2: 22 mEq/L (ref 19–32)
Chloride: 102 mEq/L (ref 96–112)
Creatinine, Ser: 2.3 mg/dL — ABNORMAL HIGH (ref 0.50–1.35)
GFR calc non Af Amer: 27 mL/min — ABNORMAL LOW (ref >90)
Total Bilirubin: 1.7 mg/dL — ABNORMAL HIGH (ref 0.3–1.2)

## 2011-08-11 LAB — BASIC METABOLIC PANEL
CO2: 21 mEq/L (ref 19–32)
CO2: 24 mEq/L (ref 19–32)
Chloride: 104 mEq/L (ref 96–112)
Chloride: 105 mEq/L (ref 96–112)
Glucose, Bld: 111 mg/dL — ABNORMAL HIGH (ref 70–99)
Potassium: 2.5 mEq/L — CL (ref 3.5–5.1)
Sodium: 139 mEq/L (ref 135–145)
Sodium: 138 mEq/L (ref 135–145)

## 2011-08-11 LAB — CBC
Platelets: 131 10*3/uL — ABNORMAL LOW (ref 150–400)
RBC: 2.79 MIL/uL — ABNORMAL LOW (ref 4.22–5.81)
WBC: 20.4 10*3/uL — ABNORMAL HIGH (ref 4.0–10.5)

## 2011-08-11 LAB — GLUCOSE, CAPILLARY
Glucose-Capillary: 89 mg/dL (ref 70–99)
Glucose-Capillary: 98 mg/dL (ref 70–99)
Glucose-Capillary: 101 mg/dL — ABNORMAL HIGH (ref 70–99)

## 2011-08-11 LAB — PROCALCITONIN: Procalcitonin: 1.07 ng/mL

## 2011-08-11 MED ORDER — ONDANSETRON HCL 4 MG/2ML IJ SOLN: 4.0000 mg | Freq: Four times a day (QID) | INTRAMUSCULAR | Status: DC | PRN

## 2011-08-11 MED ORDER — ACETAMINOPHEN 325 MG PO TABS: 650.0000 mg | ORAL_TABLET | Freq: Four times a day (QID) | ORAL | Status: DC | PRN

## 2011-08-11 MED ORDER — AMLODIPINE BESYLATE 5 MG PO TABS
5.0000 mg | ORAL_TABLET | Freq: Every day | ORAL | Status: DC
  Administered 2011-08-11 – 2011-08-13 (×3): 5 mg via ORAL
  Filled 2011-08-11 (×6): qty 1

## 2011-08-11 MED ORDER — POTASSIUM CHLORIDE 10 MEQ/100ML IV SOLN
10.0000 meq | INTRAVENOUS | Status: AC
  Administered 2011-08-11 (×2): 10 meq via INTRAVENOUS
  Filled 2011-08-11 (×2): qty 100

## 2011-08-11 MED ORDER — PROMETHAZINE HCL 25 MG/ML IJ SOLN
12.5000 mg | Freq: Four times a day (QID) | INTRAMUSCULAR | Status: DC | PRN
  Administered 2011-08-13: 12.5 mg via INTRAVENOUS
  Filled 2011-08-11 (×2): qty 1

## 2011-08-11 MED ORDER — POTASSIUM CHLORIDE CRYS ER 20 MEQ PO TBCR
40.0000 meq | EXTENDED_RELEASE_TABLET | Freq: Two times a day (BID) | ORAL | Status: DC
Start: 2011-08-11 — End: 2011-08-11
  Filled 2011-08-11: qty 2

## 2011-08-11 MED ORDER — ONDANSETRON HCL 4 MG PO TABS: 4.0000 mg | ORAL_TABLET | Freq: Four times a day (QID) | ORAL | Status: DC | PRN

## 2011-08-11 MED ORDER — DRONABINOL 2.5 MG PO CAPS
2.5000 mg | ORAL_CAPSULE | Freq: Two times a day (BID) | ORAL | Status: DC
Start: 2011-08-11 — End: 2011-08-11
  Administered 2011-08-11 (×2): 2.5 mg via ORAL
  Filled 2011-08-11 (×2): qty 1

## 2011-08-11 MED ORDER — PANCRELIPASE (LIP-PROT-AMYL) 12000-38000 UNITS PO CPEP
2.0000 | ORAL_CAPSULE | Freq: Three times a day (TID) | ORAL | Status: DC
  Administered 2011-08-11 – 2011-08-19 (×26): 2 via ORAL
  Filled 2011-08-11 (×30): qty 2

## 2011-08-11 MED ORDER — LINAGLIPTIN 5 MG PO TABS
5.0000 mg | ORAL_TABLET | Freq: Every day | ORAL | Status: DC
  Administered 2011-08-11 – 2011-08-19 (×9): 5 mg via ORAL
  Filled 2011-08-11 (×10): qty 1

## 2011-08-11 MED ORDER — INSULIN ASPART 100 UNIT/ML ~~LOC~~ SOLN
0.0000 [IU] | Freq: Three times a day (TID) | SUBCUTANEOUS | Status: DC
  Administered 2011-08-14: 1 [IU] via SUBCUTANEOUS
  Filled 2011-08-11: qty 3

## 2011-08-11 MED ORDER — PANTOPRAZOLE SODIUM 40 MG PO TBEC
40.0000 mg | DELAYED_RELEASE_TABLET | Freq: Every day | ORAL | Status: DC
  Administered 2011-08-11 – 2011-08-19 (×9): 40 mg via ORAL
  Filled 2011-08-11 (×10): qty 1

## 2011-08-11 MED ORDER — ASPIRIN 81 MG PO CHEW
81.0000 mg | CHEWABLE_TABLET | Freq: Every day | ORAL | Status: DC
  Administered 2011-08-11 – 2011-08-19 (×9): 81 mg via ORAL
  Filled 2011-08-11 (×11): qty 1

## 2011-08-11 MED ORDER — PREDNISONE 5 MG PO TABS
5.0000 mg | ORAL_TABLET | Freq: Every day | ORAL | Status: DC
  Administered 2011-08-12 – 2011-08-19 (×8): 5 mg via ORAL
  Filled 2011-08-11 (×9): qty 1

## 2011-08-11 MED ORDER — HYDROMORPHONE HCL PF 1 MG/ML IJ SOLN: 1.0000 mg | INTRAMUSCULAR | Status: DC | PRN

## 2011-08-11 MED ORDER — ACETAMINOPHEN 650 MG RE SUPP: 650.0000 mg | Freq: Four times a day (QID) | RECTAL | Status: DC | PRN

## 2011-08-11 MED ORDER — DRONABINOL 5 MG PO CAPS
10.0000 mg | ORAL_CAPSULE | Freq: Two times a day (BID) | ORAL | Status: DC
  Administered 2011-08-12 – 2011-08-16 (×10): 10 mg via ORAL
  Administered 2011-08-17: 5 mg via ORAL
  Administered 2011-08-17 – 2011-08-19 (×5): 10 mg via ORAL
  Filled 2011-08-11 (×7): qty 2
  Filled 2011-08-11: qty 1
  Filled 2011-08-11 (×6): qty 2
  Filled 2011-08-11 (×2): qty 1
  Filled 2011-08-11: qty 2

## 2011-08-11 MED ORDER — PROCHLORPERAZINE MALEATE 10 MG PO TABS
10.0000 mg | ORAL_TABLET | Freq: Four times a day (QID) | ORAL | Status: DC | PRN
  Filled 2011-08-11: qty 1

## 2011-08-11 MED ORDER — HYDROCODONE-ACETAMINOPHEN 7.5-500 MG/15ML PO SOLN
15.0000 mL | Freq: Four times a day (QID) | ORAL | Status: DC | PRN
  Administered 2011-08-13: 15 mL via ORAL
  Filled 2011-08-11: qty 15

## 2011-08-11 NOTE — ED Notes (Signed)
Family at bedside. 

## 2011-08-11 NOTE — ED Notes (Signed)
 potassium given po per viyouh

## 2011-08-11 NOTE — ED Notes (Signed)
Blood sent to lab

## 2011-08-11 NOTE — Progress Notes (Signed)
ANTICOAGULATION CONSULT NOTE - Initial Consult  Pharmacy Consult for Lovenox Indication: Hx DVT  No Known Allergies  Patient Measurements: Height: 6' 0.05" (183 cm) Weight: 155 lb (70.308 kg) IBW/kg (Calculated) : 77.71    Vital Signs: Temp: 98.2 F (36.8 C) (12/02 0416) Temp src: Oral (12/02 0416) BP: 116/50 mmHg (12/02 0416) Pulse Rate: 69  (12/02 0416)  Labs:  Basename 08/11/11 0500 08/10/11 1240 08/09/11 0849  HGB 7.7* 8.7* --  HCT 23.2* 27.0* 33.0*  PLT 131* 152 170  APTT -- -- --  LABPROT -- -- --  INR -- -- --  HEPARINUNFRC -- -- --  CREATININE 2.30* 2.30* 1.94*  CKTOTAL -- -- --  CKMB -- -- --  TROPONINI -- -- --   Estimated Creatinine Clearance: 29.7 ml/min (by C-G formula based on Cr of 2.3).  Medical History: Past Medical History  Diagnosis Date  . Pancreas cancer 2012  . Cholestasis 2012  . Liver metastases 2012  . Lung metastases 2012  . BPH (benign prostatic hyperplasia) 2012    with Dr. Alfredo Martinez  . Pulmonary embolism 2012  . Weight loss, non-intentional 2012  . Poor appetite 2012  . GERD (gastroesophageal reflux disease) 06/29/2011  . Pancreatic insufficiency 06/29/2011  . Hypertension 06/29/2011  . Hemorrhoid 06/29/2011  . Protein calorie malnutrition   . Neutropenia   . UTI (lower urinary tract infection)   . Diabetes mellitus     oral    Medications:  Scheduled:     . amLODipine  5 mg Oral Daily  . aspirin  81 mg Oral Daily  . baclofen  5 mg Oral TID  . dronabinol  2.5 mg Oral BID AC  . enoxaparin  70 mg Subcutaneous QHS  . insulin aspart  0-9 Units Subcutaneous TID WC  . linagliptin  5 mg Oral Daily  . lipase/protease/amylase  2 capsule Oral TID AC  . pantoprazole  40 mg Oral Q1200  . piperacillin-tazobactam  3.375 g Intravenous Once  . piperacillin-tazobactam (ZOSYN)  IV  3.375 g Intravenous Q8H  . potassium chloride  40 mEq Oral BID  . sodium chloride  1,000 mL Intravenous Once  . sucralfate  1 g Oral QID  .  terazosin  5 mg Oral QHS    Assessment: 70 yo M w/hx DVT. On Lovenox 120mg  sq daily PTA, last dose at [facility] 11/30 am. Pharmacy to dose Lovenox while in hospital. SCr unchanged 2.30 today 12/2, H/H, Plt decreased, Still with Leukocytosis, afebrile   Plan:  Continue Lovenox 70mg  sq q24h (1mg /kg) due to renal insufficiency.  Follow Scr, adjust dose as necessary.  Chilton Si, Kalonji Zurawski L 08/11/2011,8:00 AM

## 2011-08-11 NOTE — ED Notes (Signed)
K+ 2.5 per Susy in Lab.

## 2011-08-11 NOTE — Progress Notes (Signed)
Subjective: C/o weakness and decreased po intake, denies pain Objective: Vital signs in last 24 hours: Temp:  [98 F (36.7 C)-98.6 F (37 C)] 98 F (36.7 C) (12/02 1700) Pulse Rate:  [66-78] 78  (12/02 1700) Resp:  [15-18] 16  (12/02 1700) BP: (114-154)/(50-77) 154/77 mmHg (12/02 1700) SpO2:  [100 %] 100 % (12/02 1700) Weight:  [70.308 kg (155 lb)] 155 lb (70.308 kg) (12/02 1900) Last BM Date: 08/11/11 Intake/Output from previous day:   Intake/Output this shift: Total I/O In: -  Out: 75 [Drains:75]    General Appearance:    Alert, chronically ill appearing, cachectic with no resp. distress  Lungs:     Clear to auscultation bilaterally, respirations unlabored   Heart:    Regular rate and rhythm, S1 and S2 normal, no murmur, rub   or gallop  Abdomen:     Soft, non-tender, bowel sounds present, drain present in RUQ with about 75cc of greenish drainage     Extremities:   Extremities normal, atraumatic, no cyanosis or edema  Neurologic:   CNII-XII intact, nonfocal      Weight change:   Intake/Output Summary (Last 24 hours) at 08/11/11 2055 Last data filed at 08/11/11 2000  Gross per 24 hour  Intake      0 ml  Output     75 ml  Net    -75 ml    Lab Results:   [facility] 08/11/11 1647 08/11/11 0840  NA 138 139  K 2.8* 2.5*  CL 105 104  CO2 24 21  GLUCOSE 111* 83  BUN 28* 30*  CREATININE 2.41* 2.40*  CALCIUM 8.7 8.8    Basename 08/11/11 0500 08/10/11 1240  WBC 20.4* 20.6*  HGB 7.7* 8.7*  HCT 23.2* 27.0*  PLT 131* 152  MCV 83.2 83.1   PT/INR No results found for this basename: LABPROT:2,INR:2 in the last 72 hours ABG No results found for this basename: PHART:2,PCO2:2,PO2:2,HCO3:2 in the last 72 hours  Micro Results: Recent Results (from the past 240 hour(s))  CULTURE, BLOOD (ROUTINE X 2)     Status: Normal (Preliminary result)   Collection Time   08/10/11  4:30 PM      Component Value Range Status Comment   Specimen Description BLOOD RIGHT ANTECUBITAL    Final    Special Requests     Final    Value: BOTTLES DRAWN AEROBIC AND ANAEROBIC 6CC AEROBIC, 4 CC ANAEROBIC   Setup Time [id number]   Final    Culture     Final    Value:        BLOOD CULTURE RECEIVED NO GROWTH TO DATE CULTURE WILL BE HELD FOR 5 DAYS BEFORE ISSUING A FINAL NEGATIVE REPORT   Report Status PENDING   Incomplete   CULTURE, BLOOD (ROUTINE X 2)     Status: Normal (Preliminary result)   Collection Time   08/10/11  4:35 PM      Component Value Range Status Comment   Specimen Description BLOOD RIGHT HAND   Final    Special Requests     Final    Value: BOTTLES DRAWN AEROBIC AND ANAEROBIC 6CC AEROBIC, 4CC ANAEROBIC   Setup Time [id number]   Final    Culture     Final    Value:        BLOOD CULTURE RECEIVED NO GROWTH TO DATE CULTURE WILL BE HELD FOR 5 DAYS BEFORE ISSUING A FINAL NEGATIVE REPORT   Report Status PENDING   Incomplete   BODY FLUID  CULTURE     Status: Normal (Preliminary result)   Collection Time   08/10/11  8:52 PM      Component Value Range Status Comment   Specimen Description COMMON BILE DUCT   Final    Special Requests NONE   Final    Gram Stain     Final    Value: NO WBC SEEN     ABUNDANT GRAM POSITIVE COCCI IN PAIRS AND CHAINS     FEW YEAST   Culture Culture reincubated for better growth   Final    Report Status PENDING   Incomplete    Studies/Results: Dg Chest 2 View  08/10/2011  *RADIOLOGY REPORT*  Clinical Data: Cough and weakness; on chemotherapy for cancer  CHEST - 2 VIEW  Comparison: July 22, 2011  Findings: Mild cardiomegaly is unchanged.  The mediastinum and pulmonary vasculature are within normal limits.  There is a right subclavian Powerport with the tip at the cavoatrial junction.  Both lungs are clear.  There is tubing coiled over the abdomen on the lateral view.  IMPRESSION: Stable chest x-ray with no evidence of acute cardiac or pulmonary process.  Original Report Authenticated By: Brandon Melnick, M.D.   Medications:  Scheduled Meds:     . amLODipine  5 mg Oral Daily  . aspirin  81 mg Oral Daily  . baclofen  5 mg Oral TID  . dronabinol  10 mg Oral BID AC  . enoxaparin  70 mg Subcutaneous QHS  . insulin aspart  0-9 Units Subcutaneous TID WC  . linagliptin  5 mg Oral Daily  . lipase/protease/amylase  2 capsule Oral TID AC  . pantoprazole  40 mg Oral Q1200  . piperacillin-tazobactam (ZOSYN)  IV  3.375 g Intravenous Q8H  . potassium chloride  10 mEq Intravenous Q1 Hr x 2  . predniSONE  5 mg Oral QAC breakfast  . sucralfate  1 g Oral QID  . terazosin  5 mg Oral QHS  . DISCONTD: dronabinol  2.5 mg Oral BID AC  . DISCONTD: potassium chloride  40 mEq Oral BID   Continuous Infusions:   . sodium chloride 100 mL/hr at 08/10/11 1949   PRN Meds:.acetaminophen, acetaminophen, chlorproMAZINE, HYDROcodone-acetaminophen, HYDROmorphone, ondansetron (ZOFRAN) IV, ondansetron, prochlorperazine, promethazine, sodium chloride Assessment/Plan:  1. Acute renal failure: likely prerenal - hydrate follow and recheck. 2. Leukocytosis: UA is mildly abnl, will obtain urine culture and follow. Also pt has biliary drain,- possible source of infection but site nontender and he denies any abd. Pain. CXR w/o acute infiltrates.  -will continue empiric zosyn pending blood, biliary and urine cultures. Per discussion with admitting MD, pt received neulasta a few weeks back which may still be a contributing factor to his leukocytosis. 3.Metastatic Pancreatic Ca- appreciate Dr Lodema Pilot input. Palliative care to see pt as per Dr Lodema Pilot note. Also Dr Gaylyn Rong to decide on staging CT and order is when appropriate. Continuing Marinol and low dose prednisone ordered to help stimulate his appetite. 4.Cholestasis- secondary to#3, s/p perc. Biliary drain. 5.HTN- continue norvasc. 6.Diabetes mellitus, type II. - continue outpt meds and cover with SSI. Monitor sugars closely given his renal insuff. 7.Pancreatic insufficiency from pancreatic cancer- continue Creon 1 capsule  TID 8.Hypokalemia- replace k    LOS: 1 day   Mylz Yuan C 08/11/2011, 8:55 PM

## 2011-08-11 NOTE — ED Notes (Signed)
Report given to [facility] on [facility]. Pt ready for transfer.

## 2011-08-11 NOTE — ED Notes (Signed)
K+ 2.5 lab called results, Dr Cephas Darby aware

## 2011-08-11 NOTE — ED Notes (Signed)
pged md to order a potassium level

## 2011-08-11 NOTE — ED Notes (Signed)
Bilary drain output 100cc

## 2011-08-11 NOTE — ED Notes (Signed)
Talked to admitting md oncall about K level, ordered po and 2 runs of K iv

## 2011-08-11 NOTE — Consult Note (Signed)
[facility] INPATIENT PROGRESS NOTE  Name: Mitchell Hines      MRN: [id number]    Location: [id number]/JX91  Date: 08/11/2011 Time:5:05 PM   DIAGNOSIS: Metastatic pancreatic adenocarcinoma with met to lungs, and liver.   CURRENT THERAPY: started on FOLFOX q2wks on 06/25/2011.  Subjective: Interval History:Mitchell Hines complained that his fatigue has been getting worse compared to when I saw him on 08/09/11.  Again, he has no appetite.  He has thick mucous phlegm when he tries to swallow which has prevented him from trying to eat and drink more.  He denies nausea/vomiting; cough; abd pain; bleeding symptoms.           Objective: Vital signs in last 24 hours: Temp:  [98.2 F (36.8 C)-98.6 F (37 C)] 98.5 F (36.9 C) (12/02 1547) Pulse Rate:  [63-74] 70  (12/02 1547) Resp:  [14-18] 18  (12/02 1547) BP: (114-138)/(50-60) 114/56 mmHg (12/02 1547) SpO2:  [100 %] 100 % (12/02 1150) Weight:  [155 lb (70.308 kg)] 155 lb (70.308 kg) (12/01 1824)   ECOG performance status 2-3.   PHYSICAL EXAM:  General: Thin, cachetically appearing male in no acute distress. Eyes: No icterus. ENT: There were no oropharyngeal lesions. Neck was without thyromegaly. Lymphatics: Negative cervical, supraclavicular or axillary adenopathy. Respiratory: lungs were clear bilaterally without wheezing or crackles. Cardiovascular: Regular rate and rhythm, S1/S2, without murmur, rub or gallop. There was no pedal edema. GI: abdomen was soft, flat, nontender, nondistended, without organomegaly. His percutaneous biliary drainage tube without purulent discharge or tenderness. Muscoloskeletal: no spinal tenderness of palpation of vertebral spine. Skin exam was without echymosis, petichae. Neuro exam was nonfocal. Patient was able to get on and off exam table without assistance. Gait was normal. Patient was alerted and oriented. Attention was good. Language was appropriate. Mood appeared subdued and depressed.  Speech was not  pressured. Thought content was not tangential.    Studies/Results: Results for orders placed during the hospital encounter of 08/10/11 (from the past 48 hour(s))  CBC     Status: Abnormal   Collection Time   08/10/11 12:40 PM      Component Value Range Comment   WBC 20.6 (*) 4.0 - 10.5 (K/uL)    RBC 3.25 (*) 4.22 - 5.81 (MIL/uL)    Hemoglobin 8.7 (*) 13.0 - 17.0 (g/dL)    HCT 47.8 (*) 29.5 - 52.0 (%)    MCV 83.1  78.0 - 100.0 (fL)    MCH 26.8  26.0 - 34.0 (pg)    MCHC 32.2  30.0 - 36.0 (g/dL)    RDW 62.1 (*) 30.8 - 15.5 (%)    Platelets 152  150 - 400 (K/uL)   DIFFERENTIAL     Status: Abnormal   Collection Time   08/10/11 12:40 PM      Component Value Range Comment   Neutrophils Relative 83 (*) 43 - 77 (%)    Neutro Abs 17.2 (*) 1.7 - 7.7 (K/uL)    Lymphocytes Relative 7 (*) 12 - 46 (%)    Lymphs Abs 1.4  0.7 - 4.0 (K/uL)    Monocytes Relative 10  3 - 12 (%)    Monocytes Absolute 2.0 (*) 0.1 - 1.0 (K/uL)    Eosinophils Relative 0  0 - 5 (%)    Eosinophils Absolute 0.0  0.0 - 0.7 (K/uL)    Basophils Relative 0  0 - 1 (%)    Basophils Absolute 0.0  0.0 - 0.1 (K/uL)   LIPASE, BLOOD  Status: Abnormal   Collection Time   08/10/11 12:40 PM      Component Value Range Comment   Lipase 418 (*) 11 - 59 (U/L)   COMPREHENSIVE METABOLIC PANEL     Status: Abnormal   Collection Time   08/10/11 12:40 PM      Component Value Range Comment   Sodium 138  135 - 145 (mEq/L)    Potassium 3.1 (*) 3.5 - 5.1 (mEq/L)    Chloride 100  96 - 112 (mEq/L)    CO2 21  19 - 32 (mEq/L)    Glucose, Bld 135 (*) 70 - 99 (mg/dL)    BUN 30 (*) 6 - 23 (mg/dL)    Creatinine, Ser 1.61 (*) 0.50 - 1.35 (mg/dL)    Calcium 8.8  8.4 - 10.5 (mg/dL)    Total Protein 6.7  6.0 - 8.3 (g/dL)    Albumin 2.9 (*) 3.5 - 5.2 (g/dL)    AST 35  0 - 37 (U/L) SLIGHT HEMOLYSIS   ALT 28  0 - 53 (U/L)    Alkaline Phosphatase 188 (*) 39 - 117 (U/L)    Total Bilirubin 1.7 (*) 0.3 - 1.2 (mg/dL)    GFR calc non Af Amer 27 (*) >90  (mL/min)    GFR calc Af Amer 31 (*) >90 (mL/min)   HEMOGLOBIN A1C     Status: Abnormal   Collection Time   08/10/11 12:40 PM      Component Value Range Comment   Hemoglobin A1C 7.1 (*) <5.7 (%)    Mean Plasma Glucose 157 (*) <117 (mg/dL)   MAGNESIUM     Status: Normal   Collection Time   08/10/11 12:40 PM      Component Value Range Comment   Magnesium 2.1  1.5 - 2.5 (mg/dL)   PHOSPHORUS     Status: Normal   Collection Time   08/10/11 12:40 PM      Component Value Range Comment   Phosphorus 3.5  2.3 - 4.6 (mg/dL)   URINALYSIS, ROUTINE W REFLEX MICROSCOPIC     Status: Abnormal   Collection Time   08/10/11  3:58 PM      Component Value Range Comment   Color, Urine ORANGE (*) YELLOW  BIOCHEMICALS MAY BE AFFECTED BY COLOR   APPearance CLEAR  CLEAR     Specific Gravity, Urine 1.023  1.005 - 1.030     pH 6.0  5.0 - 8.0     Glucose, UA NEGATIVE  NEGATIVE (mg/dL)    Hgb urine dipstick LARGE (*) NEGATIVE     Bilirubin Urine MODERATE (*) NEGATIVE     Ketones, ur TRACE (*) NEGATIVE (mg/dL)    Protein, ur 30 (*) NEGATIVE (mg/dL)    Urobilinogen, UA 0.2  0.0 - 1.0 (mg/dL)    Nitrite POSITIVE (*) NEGATIVE     Leukocytes, UA TRACE (*) NEGATIVE    URINE MICROSCOPIC-ADD ON     Status: Normal   Collection Time   08/10/11  3:58 PM      Component Value Range Comment   RBC / HPF 7-10  <3 (RBC/hpf)    Bacteria, UA RARE  RARE    CULTURE, BLOOD (ROUTINE X 2)     Status: Normal (Preliminary result)   Collection Time   08/10/11  4:30 PM      Component Value Range Comment   Specimen Description BLOOD RIGHT ANTECUBITAL      Special Requests        Value: BOTTLES DRAWN  AEROBIC AND ANAEROBIC 6CC AEROBIC, 4 CC ANAEROBIC   Setup Time [id number]      Culture        Value:        BLOOD CULTURE RECEIVED NO GROWTH TO DATE CULTURE WILL BE HELD FOR 5 DAYS BEFORE ISSUING A FINAL NEGATIVE REPORT   Report Status PENDING     CULTURE, BLOOD (ROUTINE X 2)     Status: Normal (Preliminary result)   Collection Time     08/10/11  4:35 PM      Component Value Range Comment   Specimen Description BLOOD RIGHT HAND      Special Requests        Value: BOTTLES DRAWN AEROBIC AND ANAEROBIC 6CC AEROBIC, 4CC ANAEROBIC   Setup Time [id number]      Culture        Value:        BLOOD CULTURE RECEIVED NO GROWTH TO DATE CULTURE WILL BE HELD FOR 5 DAYS BEFORE ISSUING A FINAL NEGATIVE REPORT   Report Status PENDING     BODY FLUID CULTURE     Status: Normal (Preliminary result)   Collection Time   08/10/11  8:52 PM      Component Value Range Comment   Specimen Description COMMON BILE DUCT      Special Requests NONE      Gram Stain        Value: NO WBC SEEN     ABUNDANT GRAM POSITIVE COCCI IN PAIRS AND CHAINS     FEW YEAST   Culture Culture reincubated for better growth      Report Status PENDING     GLUCOSE, CAPILLARY     Status: Normal   Collection Time   08/10/11  9:57 PM      Component Value Range Comment   Glucose-Capillary 90  70 - 99 (mg/dL)   COMPREHENSIVE METABOLIC PANEL     Status: Abnormal   Collection Time   08/11/11  5:00 AM      Component Value Range Comment   Sodium 137  135 - 145 (mEq/L)    Potassium 2.5 (*) 3.5 - 5.1 (mEq/L)    Chloride 102  96 - 112 (mEq/L)    CO2 22  19 - 32 (mEq/L)    Glucose, Bld 88  70 - 99 (mg/dL)    BUN 30 (*) 6 - 23 (mg/dL)    Creatinine, Ser 2.13 (*) 0.50 - 1.35 (mg/dL)    Calcium 8.7  8.4 - 10.5 (mg/dL)    Total Protein 6.1  6.0 - 8.3 (g/dL)    Albumin 2.7 (*) 3.5 - 5.2 (g/dL)    AST 31  0 - 37 (U/L)    ALT 24  0 - 53 (U/L)    Alkaline Phosphatase 172 (*) 39 - 117 (U/L)    Total Bilirubin 1.7 (*) 0.3 - 1.2 (mg/dL)    GFR calc non Af Amer 27 (*) >90 (mL/min)    GFR calc Af Amer 31 (*) >90 (mL/min)   CBC     Status: Abnormal   Collection Time   08/11/11  5:00 AM      Component Value Range Comment   WBC 20.4 (*) 4.0 - 10.5 (K/uL)    RBC 2.79 (*) 4.22 - 5.81 (MIL/uL)    Hemoglobin 7.7 (*) 13.0 - 17.0 (g/dL)    HCT 08.6 (*) 57.8 - 52.0 (%)    MCV 83.2  78.0 -  100.0 (fL)    MCH 27.6  26.0 - 34.0 (pg)    MCHC 33.2  30.0 - 36.0 (g/dL)    RDW 16.1 (*) 09.6 - 15.5 (%)    Platelets 131 (*) 150 - 400 (K/uL)   GLUCOSE, CAPILLARY     Status: Normal   Collection Time   08/11/11  8:12 AM      Component Value Range Comment   Glucose-Capillary 89  70 - 99 (mg/dL)   BASIC METABOLIC PANEL     Status: Abnormal   Collection Time   08/11/11  8:40 AM      Component Value Range Comment   Sodium 139  135 - 145 (mEq/L)    Potassium 2.5 (*) 3.5 - 5.1 (mEq/L)    Chloride 104  96 - 112 (mEq/L)    CO2 21  19 - 32 (mEq/L)    Glucose, Bld 83  70 - 99 (mg/dL)    BUN 30 (*) 6 - 23 (mg/dL)    Creatinine, Ser 0.45 (*) 0.50 - 1.35 (mg/dL)    Calcium 8.8  8.4 - 10.5 (mg/dL)    GFR calc non Af Amer 26 (*) >90 (mL/min)    GFR calc Af Amer 30 (*) >90 (mL/min)   GLUCOSE, CAPILLARY     Status: Normal   Collection Time   08/11/11 12:25 PM      Component Value Range Comment   Glucose-Capillary 98  70 - 99 (mg/dL)    Dg Chest 2 View  40/05/8118  *RADIOLOGY REPORT*  Clinical Data: Cough and weakness; on chemotherapy for cancer  CHEST - 2 VIEW  Comparison: July 22, 2011  Findings: Mild cardiomegaly is unchanged.  The mediastinum and pulmonary vasculature are within normal limits.  There is a right subclavian Powerport with the tip at the cavoatrial junction.  Both lungs are clear.  There is tubing coiled over the abdomen on the lateral view.  IMPRESSION: Stable chest x-ray with no evidence of acute cardiac or pulmonary process.  Original Report Authenticated By: Brandon Melnick, M.D.     MEDICATIONS: Current Facility-Administered Medications  Medication Dose Route Frequency Provider Last Rate Last Dose  . 0.9 %  sodium chloride infusion   Intravenous Continuous Mutaz A Elmahi 100 mL/hr at 08/10/11 1949    . amLODipine (NORVASC) tablet 5 mg  5 mg Oral Daily Mutaz A Elmahi   5 mg at 08/11/11 1038  . aspirin chewable tablet 81 mg  81 mg Oral Daily Mutaz A Elmahi   81 mg at  08/11/11 1211  . baclofen (LIORESAL) tablet 5 mg  5 mg Oral TID Mutaz A Elmahi   5 mg at 08/11/11 1700  . chlorproMAZINE (THORAZINE) tablet 25 mg  25 mg Oral TID PRN Mary A. Lynch   25 mg at 08/10/11 2201  . dronabinol (MARINOL) capsule 2.5 mg  2.5 mg Oral BID AC Mutaz A Elmahi   2.5 mg at 08/11/11 1210  . enoxaparin (LOVENOX) injection 70 mg  70 mg Subcutaneous QHS Mutaz A Elmahi   70 mg at 08/10/11 1859  . insulin aspart (novoLOG) injection 0-9 Units  0-9 Units Subcutaneous TID WC Mutaz A Elmahi      . linagliptin (TRADJENTA) tablet 5 mg  5 mg Oral Daily Mutaz A Elmahi   5 mg at 08/11/11 1038  . lipase/protease/amylase (CREON-10/PANCREASE) capsule 2 capsule  2 capsule Oral TID AC Mutaz A Elmahi   2 capsule at 08/11/11 1211  . pantoprazole (PROTONIX) EC tablet 40 mg  40 mg Oral Q1200 Mutaz  A Elmahi   40 mg at 08/11/11 1211  . piperacillin-tazobactam (ZOSYN) IVPB 3.375 g  3.375 g Intravenous Once Otho Bellows, PHARMD   3.375 g at 08/10/11 1820  . piperacillin-tazobactam (ZOSYN) IVPB 3.375 g  3.375 g Intravenous Q8H Terri L Green, PHARMD   3.375 g at 08/11/11 1216  . potassium chloride 10 mEq in 100 mL IVPB  10 mEq Intravenous Q1 Hr x 2 Adeline C Viyuoh   10 mEq at 08/11/11 1037  . sodium chloride 0.9 % injection 10 mL  10 mL Intracatheter PRN Nicholes Stairs, MD   10 mL at 08/11/11 1610  . sucralfate (CARAFATE) tablet 1 g  1 g Oral QID Mutaz A Elmahi   1 g at 08/11/11 1417  . terazosin (HYTRIN) capsule 5 mg  5 mg Oral QHS Mutaz A Elmahi   5 mg at 08/10/11 2159  . DISCONTD: potassium chloride SA (K-DUR,KLOR-CON) CR tablet 40 mEq  40 mEq Oral BID Nicholes Stairs, MD       Current Outpatient Prescriptions  Medication Sig Dispense Refill  . amLODipine (NORVASC) 10 MG tablet Take 5 mg by mouth daily.       Marland Kitchen aspirin 81 MG chewable tablet Chew 81 mg by mouth daily.        . baclofen (LIORESAL) 10 MG tablet Take 5 mg by mouth 3 (three) times daily. For hiccups      . dronabinol (MARINOL) 2.5  MG capsule Take 2.5 mg by mouth 2 (two) times daily before a meal.        . HYDROcodone-acetaminophen (LORTAB) 7.5-500 MG/15ML solution Take 15 mLs by mouth every 6 (six) hours as needed. For pain      . lipase/protease/amylase (CREON-10/PANCREASE) 12000 UNITS CPEP Take 2 capsules by mouth 3 (three) times daily before meals. Take 1 capsule before meals and if patient plans to eat a fatty food he will take 2 more capsules      . metoCLOPramide (REGLAN) 10 MG tablet Take 1 tablet (10 mg total) by mouth 3 (three) times daily as needed (nausea/vomting; gagging or GI dysmotility. ).  30 tablet  0  . NON FORMULARY Chemotherapy is done here at the cancer center twice a month on Tuesday. Patient is scheduled to have treatment on this Tuesday coming up. Patient is under the care of Dr. Jethro Bolus.      Marland Kitchen omeprazole (PRILOSEC) 40 MG capsule Take 40 mg by mouth daily.        . ondansetron (ZOFRAN) 8 MG tablet Take 8 mg by mouth every 12 (twelve) hours as needed.        . ondansetron (ZOFRAN-ODT) 8 MG disintegrating tablet Take 8 mg by mouth every 8 (eight) hours as needed. For nausea       . potassium chloride 20 MEQ/15ML (10%) solution Take 30 mLs (40 mEq total) by mouth 2 (two) times daily.  500 mL  3  . prochlorperazine (COMPAZINE) 10 MG tablet Take 10 mg by mouth every 6 (six) hours as needed. For nausea      . sitaGLIPtin (JANUVIA) 100 MG tablet Take 100 mg by mouth daily.        . sucralfate (CARAFATE) 1 G tablet Take 1 g by mouth 4 (four) times daily.        Marland Kitchen terazosin (HYTRIN) 5 MG capsule Take 5 mg by mouth at bedtime.         Facility-Administered Medications Ordered in Other Encounters  Medication Dose  Route Frequency Provider Last Rate Last Dose  . DISCONTD: enoxaparin (LOVENOX) injection 120 mg  120 mg Subcutaneous Q24H Jethro Bolus, MD   120 mg at 08/06/11 1609   ASSESSMENT AND PLAN:  1.  Metastatic pancreatic cancer. He is s/p 3 cycles of chemo with grade 2-3 fatigue, grade 3 cytopenia with  neutropenic fever after cycle #2. His dose was decreased after the 2nd cycle. I'm concerned that his performance status has worsened to the point where further chemotherapy as of today may cause more harm then benefit.  If his performance status significantly improved, then further chemo maybe considered.  We had an extensive discussion.  Patient is aware of the dilemma that he is in right now.  Going off of chemo, most likely his diease will progress and his life expectancy is estimated at 2-3 months.  Continuing chemotherapy at his current level of performance status , his performance status will significantly decline which will also be detrimental.  He agreed to discuss with Dr. Judie Petit. Ladona Ridgel from palliative care during this admission for goal of care discussion.  I personally talked with Dr. Ladona Ridgel who graciously agreed to see patient the next few days.  I normally do not obtain staging CT after 3rd cycle of FOLFOX; however, if it makes it easier for him to decide, then CT chest/abd/pel may be considered during this admission.   2. 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  He has perc bil drainage.  3. Renal insufficiency:  Due to poor PO intake.  He's on IVF resuscitation.   4. History of pulmonary embolism, subsegmental. This asymptomatic. He is on Lovenox 70 mg SQ daily now for renal dose.  When his repeat CT chest shows resolution of the PE, we may go back to prophylactic dosing of Lononox.   5. Calorie protein malnutrition. This is secondary to the cancer and the chemotherapy. He is no max dose of Marinol at 5mg  PO BID without improvement of his appetite.  I will increase this to 10mg  PO BID which is the max dose.  I will add on low dose Prednisone 5mg  PO daily for help with increase appetite.  He's on boost.  6. Diabetes mellitus, type II. He is on sitagliptin per PCP. He checks his sugar once every day. He may need insulin with his PCP if his Glc worsens with boost.  7. Nausea and vomiting  prophylaxis. He has Compazine, Zofran, Reglan, and Ativan p.r.n.  8. Hypertension well-controlled on lisinopril per PCP.  9. Pancreatic insufficiency from pancreatic cancer. He is on Creon 1 capsule 3 times a day. I advised him to increase it to 2 capsules if he eats fatty food.   Code status. Full code for now. I discussed with him that if he were to have cardiopulmonary arrest, the chance of meaningful recovery is very low given his poor prognosis.  He will would like to remain full code for now and will discuss with his wife.

## 2011-08-11 NOTE — ED Notes (Signed)
Patient is resting comfortably. 

## 2011-08-11 NOTE — ED Notes (Signed)
bs 89

## 2011-08-12 ENCOUNTER — Encounter: Payer: Medicare Other | Admitting: Nutrition

## 2011-08-12 ENCOUNTER — Ambulatory Visit: Payer: Medicare Other

## 2011-08-12 LAB — CBC
HCT: 19.8 % — ABNORMAL LOW (ref 39.0–52.0)
Hemoglobin: 6.5 g/dL — CL (ref 13.0–17.0)
MCH: 27.2 pg (ref 26.0–34.0)
MCV: 82.8 fL (ref 78.0–100.0)
RBC: 2.39 MIL/uL — ABNORMAL LOW (ref 4.22–5.81)

## 2011-08-12 LAB — GLUCOSE, CAPILLARY
Glucose-Capillary: 83 mg/dL (ref 70–99)
Glucose-Capillary: 106 mg/dL — ABNORMAL HIGH (ref 70–99)
Glucose-Capillary: 136 mg/dL — ABNORMAL HIGH (ref 70–99)

## 2011-08-12 LAB — ABO/RH: ABO/RH(D): B POS

## 2011-08-12 LAB — BASIC METABOLIC PANEL
CO2: 22 mEq/L (ref 19–32)
Chloride: 105 mEq/L (ref 96–112)
Glucose, Bld: 80 mg/dL (ref 70–99)
Potassium: 2.6 mEq/L — CL (ref 3.5–5.1)
Sodium: 138 mEq/L (ref 135–145)

## 2011-08-12 MED ORDER — ENSURE CLINICAL ST REVIGOR PO LIQD
237.0000 mL | Freq: Three times a day (TID) | ORAL | Status: DC
  Administered 2011-08-12 – 2011-08-13 (×3): 237 mL via ORAL
  Administered 2011-08-14: 09:00:00 via ORAL
  Administered 2011-08-15 – 2011-08-16 (×3): 237 mL via ORAL

## 2011-08-12 MED ORDER — POTASSIUM CHLORIDE CRYS ER 20 MEQ PO TBCR
40.0000 meq | EXTENDED_RELEASE_TABLET | Freq: Once | ORAL | Status: AC
  Administered 2011-08-12: 40 meq via ORAL
  Filled 2011-08-12: qty 2

## 2011-08-12 MED ORDER — POTASSIUM CHLORIDE 10 MEQ/100ML IV SOLN
10.0000 meq | INTRAVENOUS | Status: AC
  Administered 2011-08-12 (×4): 10 meq via INTRAVENOUS
  Filled 2011-08-12 (×4): qty 100

## 2011-08-12 NOTE — Progress Notes (Signed)
PT Cancellation Note 11:18    __X_ Treatment cancelled today due to low K and Hgb.  Will check back tomorrow.   Signature: Clydie Braun L. Katrinka Blazing, PT Pager: [phone] 08/12/2011

## 2011-08-12 NOTE — Progress Notes (Signed)
CRITICAL VALUE ALERT  Critical value received:Hgb 6.5  Date of notification:  08/12/2011  Time of notification:  0615  Critical value read back: yes  Nurse who received alert:  Belinda Fisher  MD notified (1st page):  M. Burnadette Peter, NP  Time of first page: [phone]  Responding MD:  M. Burnadette Peter, NP  Time MD responded:  [phone]

## 2011-08-12 NOTE — H&P (Addendum)
Hospital Admission Note Date: 08/12/2011  Patient name: Mitchell Hines           Medical record number: [id number] Date of birth: 1940/10/16           Age: 70 y.o.   Gender: male    PCP:   Dartha Lodge, MD   Chief Complaint:  Generalized weakness  HPI: Mitchell Hines is a 70 y.o. male with past medical history of stage IV pancreatic cancer, diabetes mellitus type 2 and previous history of neutropenia secondary to chemotherapy. Patient is undergoing chemotherapy with Dr. Gaylyn Rong. He came into the hospital complaining about generalized weakness and poor oral intake. He saw Dr. Charmian Muff yesterday in his office. And according to him he did have troubles eating, drinking and ambulating. Patient weakness was getting worse this morning so he decided to come to the emergency department when he couldn't get off of the bed. Upon initial evaluation in the emergency department patient was found to have acute renal failure with a creatinine of 2.3. His creatinine about 2 weeks ago was 1.1. Patient also was found to have leukocytosis with WBCs count of 20.6 K. and absolute neutrophil counts of 17.2 K. patient will be admitted to the hospital for further evaluation  Past Medical History: Past Medical History  Diagnosis Date  . Pancreas cancer 2012  . Cholestasis 2012  . Liver metastases 2012  . Lung metastases 2012  . BPH (benign prostatic hyperplasia) 2012    with Dr. Alfredo Martinez  . Pulmonary embolism 2012  . Weight loss, non-intentional 2012  . Poor appetite 2012  . GERD (gastroesophageal reflux disease) 06/29/2011  . Pancreatic insufficiency 06/29/2011  . Hypertension 06/29/2011  . Hemorrhoid 06/29/2011  . Protein calorie malnutrition   . Neutropenia   . UTI (lower urinary tract infection)   . Diabetes mellitus     oral   Past Surgical History  Procedure Date  . Portacath placement 06/10/11 Powerport    Tip in lower SVC per Dr. Lowella Dandy  . Percutaneous external biliary drain placement 06/07/11     Medications: Prior to Admission medications   Medication Sig Start Date End Date Taking? Authorizing Provider  amLODipine (NORVASC) 10 MG tablet Take 5 mg by mouth daily.    Yes Historical Provider, MD  aspirin 81 MG chewable tablet Chew 81 mg by mouth daily.     Yes Historical Provider, MD  baclofen (LIORESAL) 10 MG tablet Take 5 mg by mouth 3 (three) times daily. For hiccups   Yes Historical Provider, MD  dronabinol (MARINOL) 2.5 MG capsule Take 2.5 mg by mouth 2 (two) times daily before a meal.     Yes Historical Provider, MD  HYDROcodone-acetaminophen (LORTAB) 7.5-500 MG/15ML solution Take 15 mLs by mouth every 6 (six) hours as needed. For pain   Yes Jethro Bolus, MD  lipase/protease/amylase (CREON-10/PANCREASE) 12000 UNITS CPEP Take 2 capsules by mouth 3 (three) times daily before meals. Take 1 capsule before meals and if patient plans to eat a fatty food he will take 2 more capsules   Yes Historical Provider, MD  metoCLOPramide (REGLAN) 10 MG tablet Take 1 tablet (10 mg total) by mouth 3 (three) times daily as needed (nausea/vomting; gagging or GI dysmotility. ). 08/09/11 08/19/11 Yes Jethro Bolus, MD  NON FORMULARY Chemotherapy is done here at the cancer center twice a month on Tuesday. Patient is scheduled to have treatment on this Tuesday coming up. Patient is under the care of Dr. Jethro Bolus.   Yes Historical Provider, MD  omeprazole (PRILOSEC) 40 MG capsule Take 40 mg by mouth daily.     Yes Historical Provider, MD  ondansetron (ZOFRAN) 8 MG tablet Take 8 mg by mouth every 12 (twelve) hours as needed.     Yes Historical Provider, MD  ondansetron (ZOFRAN-ODT) 8 MG disintegrating tablet Take 8 mg by mouth every 8 (eight) hours as needed. For nausea  06/19/11  Yes Historical Provider, MD  potassium chloride 20 MEQ/15ML (10%) solution Take 30 mLs (40 mEq total) by mouth 2 (two) times daily. 08/09/11 09/08/11 Yes Jethro Bolus, MD  prochlorperazine (COMPAZINE) 10 MG tablet Take 10 mg by mouth every 6 (six)  hours as needed. For nausea   Yes Historical Provider, MD  sitaGLIPtin (JANUVIA) 100 MG tablet Take 100 mg by mouth daily.     Yes Historical Provider, MD  sucralfate (CARAFATE) 1 G tablet Take 1 g by mouth 4 (four) times daily.     Yes Historical Provider, MD  terazosin (HYTRIN) 5 MG capsule Take 5 mg by mouth at bedtime.     Yes Historical Provider, MD    Allergies:  No Known Allergies  Social History:  reports that he quit smoking about 30 years ago. His smoking use included Cigarettes. He has a 30 pack-year smoking history. He has never used smokeless tobacco. He reports that he does not drink alcohol or use illicit drugs.  Family History: Family History  Problem Relation Age of Onset  . Cervical cancer Mother   . Diabetes type II Mother   . Stroke Father   . Pancreatic cancer Brother   . Coronary artery disease Brother     Review of Systems:  Constitutional: negative for anorexia, fevers and sweats Eyes: negative for irritation, redness and visual disturbance Ears, nose, mouth, throat, and face: negative for earaches, epistaxis, nasal congestion and sore throat Respiratory: negative for cough, dyspnea on exertion, sputum and wheezing Cardiovascular: negative for chest pain, dyspnea, lower extremity edema, orthopnea, palpitations and syncope Gastrointestinal: negative for abdominal pain, constipation, diarrhea, melena, nausea and vomiting Genitourinary:negative for dysuria, frequency and hematuria Hematologic/lymphatic: negative for bleeding, easy bruising and lymphadenopathy Musculoskeletal:negative for arthralgias, muscle weakness and stiff joints Neurological: negative for coordination problems, gait problems, headaches and weakness Endocrine: negative for diabetic symptoms including polydipsia, polyuria and weight loss Allergic/Immunologic: negative for anaphylaxis, hay fever and urticaria  Physical Exam: BP 106/57  Pulse 54  Temp(Src) 97.9 F (36.6 C) (Oral)  Resp 16   Ht 6' (1.829 m)  Wt 73.483 kg (162 lb)  BMI 21.97 kg/m2  SpO2 100% General appearance: alert, cooperative and no distress  Head: Normocephalic, without obvious abnormality, atraumatic  Eyes: conjunctivae/corneas clear. PERRL, EOM's intact. Fundi benign.  Nose: Nares normal. Septum midline. Mucosa normal. No drainage or sinus tenderness.  Throat: lips, mucosa, and tongue normal; teeth and gums normal  Neck: Supple, no masses, no cervical lymphadenopathy, no JVD appreciated, no meningeal signs Resp: clear to auscultation bilaterally  Chest wall: no tenderness  Cardio: regular rate and rhythm, S1, S2 normal, no murmur, click, rub or gallop  GI: soft, non-tender; bowel sounds normal; no masses, no organomegaly  Extremities: extremities normal, atraumatic, no cyanosis or edema  Skin: Skin color, texture, turgor normal. No rashes or lesions  Neurologic: Alert and oriented X 3, normal strength and tone. Normal symmetric reflexes. Normal coordination and gait  Labs on Admission:   Basename 08/12/11 0542 08/11/11 1647 08/10/11 1240  NA 138 138 --  K 2.6* 2.8* --  CL 105 105 --  CO2 22 24 --  GLUCOSE 80 111* --  BUN 26* 28* --  CREATININE 2.19* 2.41* --  CALCIUM 8.5 8.7 --  MG -- -- 2.1  PHOS -- -- 3.5    Basename 08/11/11 0500 08/10/11 1240  AST 31 35  ALT 24 28  ALKPHOS 172* 188*  BILITOT 1.7* 1.7*  PROT 6.1 6.7  ALBUMIN 2.7* 2.9*    Basename 08/10/11 1240  LIPASE 418*  AMYLASE --    Basename 08/12/11 0542 08/11/11 0500 08/10/11 1240  WBC 15.8* 20.4* --  NEUTROABS -- -- 17.2*  HGB 6.5* 7.7* --  HCT 19.8* 23.2* --  MCV 82.8 83.2 --  PLT 115* 131* --   Radiological Exams on Admission: Dg Chest 2 View 08/10/2011  *RADIOLOGY REPORT*  Findings: Mild cardiomegaly is unchanged.  The mediastinum and pulmonary vasculature are within normal limits.  There is a right subclavian Powerport with the tip at the cavoatrial junction.  Both lungs are clear.  There is tubing coiled  over the abdomen on the lateral view.   IMPRESSION: Stable chest x-ray with no evidence of acute cardiac or pulmonary process.      IMPRESSION: Present on Admission:  .Acute renal failure .Generalized weakness .Leukocytosis .Failure to thrive .Pancreas cancer .ARF (acute renal failure)  Assessment/Plan  1. Acute renal failure: This is probably secondary to the poor oral intake and dehydration. Patient will be aggressively hydrated with IV fluids. His renal function will be followed daily with a BMP. Also in the nephrotoxic medications will be discontinued.  2. Leukocytosis: Patient reported last time she had chemotherapy was about 2 weeks ago and he could not remember if he was getting Neupogen or not. His white blood count is in the high side with neutrophilia. I will rule out a hidden infection and sepsis. Will get pro-calcitonin and lactic acid levels. Blood cultures obtained already in the emergency department patient will be started on Zosyn. Chest x-ray and a UA as well as biliary culture will be obtained. Please note the patient has biliary drain, but he does not have any tenderness or fever to suggest cholangitis.  3. Generalized weakness: This is probably multifactorial secondary to his cancer, renal failure, and poor oral intake. We'll obtain PT to evaluate him.  4. History of DVT: Patient is on therapeutic Lovenox and that will be restarted and pharmacy will be consulted to adjust the dosage.  5. 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 Patient is following with Dr. Gaylyn Rong. Patient had 3 cycles of chemotherapy he supposed to get chemotherapy next week in a restaging CT scan.  6. Transaminitis: This is secondary probably to a percutaneous biliary drain pulse of the liver metastasis. Patient does not have tenderness or RUQ pain. His total bilirubin also is tending down. So cholangitis very unlikely.  7. Diabetes mellitus type 2: I think this is the  least of his concerns now. Patient on carbohydrate modified diet put him on insulin sliding scale. His home medication will be continued.    Mitchell Hines A 08/12/2011, 4:46 PM

## 2011-08-12 NOTE — Progress Notes (Signed)
CRITICAL VALUE ALERT  Critical value received: potassium 2.6  Date of notification:  08/12/2011  Time of notification:  0620  Critical value read back: yes  Nurse who received alert:  Belinda Fisher  MD notified (1st page):  M. Burnadette Peter, NP  Time of first page:  [phone]  Responding MD:  M. Burnadette Peter, NP  Time MD responded:  [phone]

## 2011-08-12 NOTE — Progress Notes (Signed)
INITIAL ADULT NUTRITION ASSESSMENT Date: 08/12/2011   Time: 1:38 PM Reason for Assessment: consult  ASSESSMENT: Male 70 y.o.  Dx: ARF (acute renal failure)  Hx:  Past Medical History  Diagnosis Date  . Pancreas cancer 2012  . Cholestasis 2012  . Liver metastases 2012  . Lung metastases 2012  . BPH (benign prostatic hyperplasia) 2012    with Dr. Alfredo Martinez  . Pulmonary embolism 2012  . Weight loss, non-intentional 2012  . Poor appetite 2012  . GERD (gastroesophageal reflux disease) 06/29/2011  . Pancreatic insufficiency 06/29/2011  . Hypertension 06/29/2011  . Hemorrhoid 06/29/2011  . Protein calorie malnutrition   . Neutropenia   . UTI (lower urinary tract infection)   . Diabetes mellitus     oral   Past Surgical History  Procedure Date  . Portacath placement 06/10/11 Powerport    Tip in lower SVC per Dr. Lowella Dandy  . Percutaneous external biliary drain placement 06/07/11    Related Meds:  Scheduled Meds:   . amLODipine  5 mg Oral Daily  . aspirin  81 mg Oral Daily  . baclofen  5 mg Oral TID  . dronabinol  10 mg Oral BID AC  . enoxaparin  70 mg Subcutaneous QHS  . insulin aspart  0-9 Units Subcutaneous TID WC  . linagliptin  5 mg Oral Daily  . lipase/protease/amylase  2 capsule Oral TID AC  . pantoprazole  40 mg Oral Q1200  . piperacillin-tazobactam (ZOSYN)  IV  3.375 g Intravenous Q8H  . potassium chloride  10 mEq Intravenous Q1 Hr x 4  . potassium chloride  40 mEq Oral Once  . predniSONE  5 mg Oral QAC breakfast  . sucralfate  1 g Oral QID  . terazosin  5 mg Oral QHS  . DISCONTD: dronabinol  2.5 mg Oral BID AC   Continuous Infusions:   . sodium chloride 100 mL/hr at 08/12/11 0649   PRN Meds:.acetaminophen, acetaminophen, chlorproMAZINE, HYDROcodone-acetaminophen, HYDROmorphone, ondansetron (ZOFRAN) IV, ondansetron, prochlorperazine, promethazine, sodium chloride   Ht: 6' (182.9 cm)  Wt: 162 lb (73.483 kg)  Ideal Wt: 86.9 kg % Ideal Wt:  84%  Usual Wt: 125 kg- previously reported per pt % Usual Wt: 78% Pt weighed 79.6 kg at last admission 07/23/11; 7.7% loss in < 1 month, severe wt loss   Body mass index is 21.97 kg/(m^2).  Food/Nutrition Related Hx:  Pt sleeping soundly at time of visit.  Discussed with RN.  PO intake has been variable- 40-90% of meals, however RN states pt refused breakfast this morning stating he wasn't hungry.  Chart reviewed; noted pt to meet with Palliative Medicine.  Wt loss indicates intake has been insufficient since last d/c.    Pt has been on Marinol and prednisone. Pt also requires Creon with meals.  Labs:  CMP     Component Value Date/Time   NA 138 08/12/2011 0542   K 2.6* 08/12/2011 0542   CL 105 08/12/2011 0542   CO2 22 08/12/2011 0542   GLUCOSE 80 08/12/2011 0542   BUN 26* 08/12/2011 0542   CREATININE 2.19* 08/12/2011 0542   CALCIUM 8.5 08/12/2011 0542   PROT 6.1 08/11/2011 0500   ALBUMIN 2.7* 08/11/2011 0500   AST 31 08/11/2011 0500   ALT 24 08/11/2011 0500   ALKPHOS 172* 08/11/2011 0500   BILITOT 1.7* 08/11/2011 0500   GFRNONAA 29* 08/12/2011 0542   GFRAA 33* 08/12/2011 0542    Intake: variable Output:   Intake/Output Summary (Last 24 hours) at  08/12/11 1350 Last data filed at 08/12/11 1100  Gross per 24 hour  Intake      0 ml  Output    375 ml  Net   -375 ml   I/O last 3 completed shifts: In: -  Out: 175 [Urine:100; Drains:75] Total I/O In: -  Out: 200 [Urine:200]   Diet Order: Carb Control  Supplements/Tube Feeding:  None at this time  IVF:    sodium chloride Last Rate: 100 mL/hr at 08/12/11 0649    Estimated Nutritional Needs:   Kcal:2000-2400 kcal Protein:94-120 g Fluid:2-2.4 L/day  NUTRITION DIAGNOSIS: -Inadequate oral intake (NI-2.1).  Status: Ongoing  RELATED TO: anorexia, weakness  AS EVIDENCE BY: pt with continued wt loss despite appetite stimulant, enzyme replacement.  MONITORING/EVALUATION(Goals): 1.  Food/Beverage; pt to continue consuming >50%  of meals.  Limit refused meals. 2.  GOC; nutrition to comply  EDUCATION NEEDS: -No education needs identified at this time  INTERVENTION: 1.  Supplements; Ensure Clinical Strength WITH MEALS as pt has no additional orders for Creon between meals for snacks.   Dietitian [phone]  DOCUMENTATION CODES Per approved criteria  -Severe malnutrition in the context of chronic illness    Hoyt Koch 08/12/2011, 1:38 PM

## 2011-08-12 NOTE — Progress Notes (Signed)
Subjective: It's he feels better today, better by mouth intake today. Wife at the bedside. Objective: Vital signs in last 24 hours: Temp:  [97.2 F (36.2 C)-98.3 F (36.8 C)] 97.9 F (36.6 C) (12/03 1630) Pulse Rate:  [54-64] 54  (12/03 1630) Resp:  [16] 16  (12/03 1630) BP: (95-116)/(50-64) 106/57 mmHg (12/03 1630) SpO2:  [100 %] 100 % (12/03 0444) Weight:  [73.483 kg (162 lb)] 162 lb (73.483 kg) (12/03 0444) Last BM Date: 08/12/11 Intake/Output from previous day: 12/02 0701 - 12/03 0700 In: -  Out: 175 [Urine:100; Drains:75] Intake/Output this shift:      General Appearance:    Alert, and appropriate. In no distress distress  Lungs:     Clear to auscultation bilaterally, respirations unlabored   Heart:    Regular rate and rhythm, S1 and S2 normal, no murmur, rub   or gallop  Abdomen:     Soft, non-tender, bowel sounds present, drain present in RUQ with  greenish drainage     Extremities:   Extremities normal, atraumatic, no cyanosis or edema  Neurologic:   CNII-XII intact, nonfocal      Weight change: -0.392 kg (-13.8 oz)  Intake/Output Summary (Last 24 hours) at 08/12/11 1911 Last data filed at 08/12/11 1700  Gross per 24 hour  Intake 1132.5 ml  Output    550 ml  Net  582.5 ml    Lab Results:   [facility] 08/12/11 0542 08/11/11 1647  NA 138 138  K 2.6* 2.8*  CL 105 105  CO2 22 24  GLUCOSE 80 111*  BUN 26* 28*  CREATININE 2.19* 2.41*  CALCIUM 8.5 8.7    Basename 08/12/11 0542 08/11/11 0500  WBC 15.8* 20.4*  HGB 6.5* 7.7*  HCT 19.8* 23.2*  PLT 115* 131*  MCV 82.8 83.2   PT/INR No results found for this basename: LABPROT:2,INR:2 in the last 72 hours ABG No results found for this basename: PHART:2,PCO2:2,PO2:2,HCO3:2 in the last 72 hours  Micro Results: Recent Results (from the past 240 hour(s))  CULTURE, BLOOD (ROUTINE X 2)     Status: Normal (Preliminary result)   Collection Time   08/10/11  4:30 PM      Component Value Range Status Comment   Specimen Description BLOOD RIGHT ANTECUBITAL   Final    Special Requests     Final    Value: BOTTLES DRAWN AEROBIC AND ANAEROBIC 6CC AEROBIC, 4 CC ANAEROBIC   Setup Time [id number]   Final    Culture     Final    Value:        BLOOD CULTURE RECEIVED NO GROWTH TO DATE CULTURE WILL BE HELD FOR 5 DAYS BEFORE ISSUING A FINAL NEGATIVE REPORT   Report Status PENDING   Incomplete   CULTURE, BLOOD (ROUTINE X 2)     Status: Normal (Preliminary result)   Collection Time   08/10/11  4:35 PM      Component Value Range Status Comment   Specimen Description BLOOD RIGHT HAND   Final    Special Requests     Final    Value: BOTTLES DRAWN AEROBIC AND ANAEROBIC 6CC AEROBIC, 4CC ANAEROBIC   Setup Time [id number]   Final    Culture     Final    Value:        BLOOD CULTURE RECEIVED NO GROWTH TO DATE CULTURE WILL BE HELD FOR 5 DAYS BEFORE ISSUING A FINAL NEGATIVE REPORT   Report Status PENDING   Incomplete   BODY FLUID  CULTURE     Status: Normal (Preliminary result)   Collection Time   08/10/11  8:52 PM      Component Value Range Status Comment   Specimen Description COMMON BILE DUCT   Final    Special Requests NONE   Final    Gram Stain     Final    Value: NO WBC SEEN     ABUNDANT GRAM POSITIVE COCCI IN PAIRS AND CHAINS     FEW YEAST   Culture Culture reincubated for better growth   Final    Report Status PENDING   Incomplete    Studies/Results: No results found. Medications:  Scheduled Meds:    . amLODipine  5 mg Oral Daily  . aspirin  81 mg Oral Daily  . baclofen  5 mg Oral TID  . dronabinol  10 mg Oral BID AC  . enoxaparin  70 mg Subcutaneous QHS  . feeding supplement  237 mL Oral TID WC  . insulin aspart  0-9 Units Subcutaneous TID WC  . linagliptin  5 mg Oral Daily  . lipase/protease/amylase  2 capsule Oral TID AC  . pantoprazole  40 mg Oral Q1200  . piperacillin-tazobactam (ZOSYN)  IV  3.375 g Intravenous Q8H  . potassium chloride  10 mEq Intravenous Q1 Hr x 4  . potassium chloride   40 mEq Oral Once  . predniSONE  5 mg Oral QAC breakfast  . sucralfate  1 g Oral QID  . terazosin  5 mg Oral QHS   Continuous Infusions:    . sodium chloride 100 mL/hr at 08/12/11 0649   PRN Meds:.acetaminophen, acetaminophen, chlorproMAZINE, HYDROcodone-acetaminophen, HYDROmorphone, ondansetron (ZOFRAN) IV, ondansetron, prochlorperazine, promethazine, sodium chloride Assessment/Plan:  1. Acute renal failure: likely prerenal -gradually improving with hydration, follow and recheck. 2. Leukocytosis: Improving,Continue current antibiotics. pt has biliary drain and the Gram stain shows and underwent gram-positive cocci in pairs and chains follow pending cultures,   drain site nontender and he denies any abd. Pain. CXR w/o acute infiltrates.  - Per discussion with admitting MD, pt received neulasta a few weeks back which may still be a contributing factor to his leukocytosis. 3.Metastatic Pancreatic Ca- appreciate Dr Lodema Pilot input. Palliative care to see pt as per Dr Lodema Pilot note. Also Dr Gaylyn Rong to decide on staging CT and order is when appropriate. Continuing Marinol and low dose prednisone better appetite noted today. 4.Cholestasis- secondary to#3, s/p perc. Biliary drain, continue empiric antibiotics as above and follow up cultures.Marland Kitchen 5.HTN- continue norvasc.  6.Diabetes mellitus, type II. - continue outpt meds and cover with SSI. Monitor sugars closely given his renal insuff. 7.Pancreatic insufficiency from pancreatic cancer- continue Creon 1 capsule TID 8.Hypokalemia- replace k, check mg   LOS: 2 days   Cherene Dobbins C 08/12/2011, 7:11 PM

## 2011-08-13 ENCOUNTER — Ambulatory Visit: Payer: Medicare Other

## 2011-08-13 LAB — BODY FLUID CULTURE: Gram Stain: NONE SEEN

## 2011-08-13 LAB — URINE CULTURE
Colony Count: NO GROWTH
Culture  Setup Time: 201212030843
Culture: NO GROWTH

## 2011-08-13 LAB — GLUCOSE, CAPILLARY: Glucose-Capillary: 97 mg/dL (ref 70–99)

## 2011-08-13 LAB — BASIC METABOLIC PANEL
BUN: 18 mg/dL (ref 6–23)
Calcium: 8.3 mg/dL — ABNORMAL LOW (ref 8.4–10.5)
GFR calc non Af Amer: 38 mL/min — ABNORMAL LOW (ref >90)
Glucose, Bld: 98 mg/dL (ref 70–99)

## 2011-08-13 LAB — CBC
MCH: 27.1 pg (ref 26.0–34.0)
MCHC: 33 g/dL (ref 30.0–36.0)
Platelets: 122 10*3/uL — ABNORMAL LOW (ref 150–400)

## 2011-08-13 MED ORDER — HYDROCORTISONE 2.5 % RE CREA
TOPICAL_CREAM | Freq: Three times a day (TID) | RECTAL | Status: DC
  Administered 2011-08-13 – 2011-08-16 (×9): via RECTAL
  Administered 2011-08-16: 1 via RECTAL
  Administered 2011-08-17 – 2011-08-18 (×3): via RECTAL
  Filled 2011-08-13: qty 28.35

## 2011-08-13 MED ORDER — ENOXAPARIN SODIUM 120 MG/0.8ML ~~LOC~~ SOLN
120.0000 mg | SUBCUTANEOUS | Status: DC
  Administered 2011-08-13 – 2011-08-19 (×7): 120 mg via SUBCUTANEOUS
  Filled 2011-08-13 (×8): qty 0.8

## 2011-08-13 MED ORDER — BISACODYL 5 MG PO TBEC: 10.0000 mg | DELAYED_RELEASE_TABLET | Freq: Every day | ORAL | Status: DC | PRN

## 2011-08-13 MED ORDER — POTASSIUM CHLORIDE CRYS ER 20 MEQ PO TBCR
40.0000 meq | EXTENDED_RELEASE_TABLET | ORAL | Status: DC
  Filled 2011-08-13 (×2): qty 2

## 2011-08-13 MED ORDER — POTASSIUM CHLORIDE 10 MEQ/100ML IV SOLN
10.0000 meq | INTRAVENOUS | Status: AC
  Administered 2011-08-13 (×3): 10 meq via INTRAVENOUS
  Filled 2011-08-13 (×3): qty 100

## 2011-08-13 MED ORDER — SENNOSIDES-DOCUSATE SODIUM 8.6-50 MG PO TABS
1.0000 | ORAL_TABLET | Freq: Every day | ORAL | Status: DC
  Administered 2011-08-14 – 2011-08-19 (×4): 1 via ORAL
  Filled 2011-08-13 (×8): qty 1

## 2011-08-13 NOTE — Progress Notes (Addendum)
Speech Language/Pathology Clinical/Bedside Swallow Evaluation Patient Details  Name: Mitchell Hines MRN: [id number] DOB: 1940-09-12 Today's Date: 08/13/2011 1630-1701  Past Medical History:  Past Medical History  Diagnosis Date  . Pancreas cancer 2012  . Cholestasis 2012  . Liver metastases 2012  . Lung metastases 2012  . BPH (benign prostatic hyperplasia) 2012    with Dr. Alfredo Martinez  . Pulmonary embolism 2012  . Weight loss, non-intentional 2012  . Poor appetite 2012  . GERD (gastroesophageal reflux disease) 06/29/2011  . Pancreatic insufficiency 06/29/2011  . Hypertension 06/29/2011  . Hemorrhoid 06/29/2011  . Protein calorie malnutrition   . Neutropenia   . UTI (lower urinary tract infection)   . Diabetes mellitus     oral   Past Surgical History:  Past Surgical History  Procedure Date  . Portacath placement 06/10/11 Powerport    Tip in lower SVC per Dr. Lowella Dandy  . Percutaneous external biliary drain placement 06/07/11    Assessment/Recommendations/Treatment Plan Suspected Esophageal Findings Suspected Esophageal Findings: Other (comment) (Pt reporting secretions he expectorates being frothy)  SLP Assessment Clinical Impression Statement: Pt continues to appear with a functional oropharyngeal swallow regarding neuromuscular ability.  Suspect pt's pancreatic cancer with frequent nausea/vomiting as primary source of difficulties and pt is receiving carafate and zofran prn to manage these issues.  Pt reports phlegm stasis in pharynx impacts his swallowing, but when he is able to clear secretions, his swallowing improves.  SLP advised pt to rinse with warm water to aid expectoration of secretions before attemtping meals.  Advised pt to remain hydrated to assist with secretion viscocity, to which pt stated "I am being forced to drink" by his wife.  Pt has a humidifier at home to assist with moisture/secretions as well.  Pt complains of intake being poor due to food being  tasteless and reminding him of sawdust.  No significant medical changes since previous admission.  RN reports pt receiving Baclofen that may help to decr hiccups, hiccups reported present last admission.   Other Related Risk Factors: History of dysphagia  Recommendations Solid Consistency: Regular/Extra gravy and sauce Liquid Consistency: Thin Medication Administration: Other (Comment) (as pt requests) Compensations: Slow rate;Small sips/bites; Start meal with liquids, Consume liquids throughout meal Postural Changes and/or Swallow Maneuvers: Seated upright 90 degrees Oral Care Recommendations: Oral care BID     Individuals Consulted Consulted and Agree with Results and Recommendations: Patient;Family member/caregiver Family Member Consulted: spouse  Swallow Study Goals   none, no follow up needed    General  Date of Onset: 08/13/11 Other Pertinent Information: 70 y.o. male with past medical history of stage IV pancreatic cancer, diabetes mellitus type 2 and previous history of neutropenia secondary to chemotherapy. Patient is undergoing chemotherapy with Dr. Gaylyn Rong. Generalized weakness and poor oral intake preceded pt's hospital admission.  Pt did have troubles eating, drinking and ambulating per admitting MD note. Upon eval in ED, pt found to have acute renal failure, leukocytosis and be neutrophilia.  Pt seen during previous hospital admission for swallow evaluation secondary to complaint of dysphagia.    Per documentation by Dr Gaylyn Rong, pt reports thick phlegm that is present when trying to swallow prevents him from consuming more po.  Pt stated that he ate a full meal yesterday but later vomited all of it up within 30 minutes.  When asked if he tolerates several small meals better, he verbalized yes.       Type of Study: Bedside swallow evaluation Diet Prior to this Study: Regular;Thin liquids  Behavior/Cognition: Alert;Cooperative;Pleasant mood Oral Cavity - Dentition: Adequate natural  dentition Patient Positioning: Upright in bed Baseline Vocal Quality: Normal Volitional Cough: Weak;Other (Comment) (strong and productive when necessary) Volitional Swallow: Able to elicit Ice chips: Not tested  Oral Motor/Sensory Function  Labial ROM: Within Functional Limits Labial Symmetry: Within Functional Limits Labial Strength: Within Functional Limits Labial Sensation: Within Functional Limits Lingual ROM: Within Functional Limits Lingual Symmetry: Within Functional Limits Lingual Strength: Within Functional Limits Lingual Sensation: Within Functional Limits Facial ROM: Within Functional Limits Facial Symmetry: Within Functional Limits Facial Strength: Within Functional Limits Facial Sensation: Within Functional Limits Velum: Within Functional Limits Mandible: Within Functional Limits  Consistency Results  Ice Chips Ice chips: Not tested  Thin Liquid Thin Liquid: Within functional limits  Nectar Thick Liquid Nectar Thick Liquid: Not tested  Honey Thick Liquid Honey Thick Liquid: Not tested  Puree Puree: Not tested  Solid Other Comments: observed swallowing pills with water with RN present   [name] 08/13/2011,5:30 PM   Note CXR 12/1/112 was negative for acute cardiac or pulmonary process.

## 2011-08-13 NOTE — Consult Note (Signed)
Consult Note from the Palliative Medicine Team at [facility]  Consult Requested by: Dr. Suanne Marker      PCP: Dartha Lodge, MD Reason for Consultation:GOC and related Sx Rec    Phone Number:None  Assessment and Plan: 1. Code Status:Full code.  This patient is so devastated by his prognosis that he could bearly participate in goals of care.  I left 2 hard choices books with his wife to see if this would stimulate later conversation.  We will return on Thursday to talk with him.  He is a private stoic man who is easily overwhelmed and so I have asked his permission to come back at that time and he is agreeable. 2. Symptom Control: 1. Abdomen Pain: currently states controlled 2. Exccessive fatigue:  Might consider risks vs benefit treating fatigue with a stimulate will re-eval based on his goals 3. Psycho/Social:Pt is married with one daughter who is pregnant.  Baby is due in April so we added that to the things that can energize him.  He is most worried about his wife paying the mortgage and the bills.  We could not get past that .  Patients wife would like [place] to consider talking separately with the patient's daughter to give her an opportunity to express her concerns and grief. 4. Spiritual: Pt was raised in a Christian home but does not assert that as a coping mechanism for himself.  Permission should be obtained before sending Spiritual Care to see him. 5. Disposition: Pt needs further goals of care to determine disposition.  At present home is the preferred location of care but the resources that he will permit have not been discussed with him.  His wife and I spoke about how hospice is available for home care but further conversation must be had before making these arrangements.  My partner will work with him again tomorrow  Patient Documents Completed or Given: Document Given Completed  Advanced Directives Pkt    MOST    DNR    Gone from My Sight    Hard Choices xx     Brief HPI: 70 yr old  AA male with advanced pancreatic cancer.  Course complicated by left lower lobe pulmonary emboli, cachexia, and extreme fatigue.  Suffering from pancreatic insufficiency, cholestasias. Patient was admitted severe dehydration with renal failure.    ROS:  Unable to be obtained.  Pt offering limited glimpses of himself.  He currently denies pain.    PMH:  Past Medical History  Diagnosis Date  . Pancreas cancer 2012  . Cholestasis 2012  . Liver metastases 2012  . Lung metastases 2012  . BPH (benign prostatic hyperplasia) 2012    with Dr. Alfredo Martinez  . Pulmonary embolism 2012  . Weight loss, non-intentional 2012  . Poor appetite 2012  . GERD (gastroesophageal reflux disease) 06/29/2011  . Pancreatic insufficiency 06/29/2011  . Hypertension 06/29/2011  . Hemorrhoid 06/29/2011  . Protein calorie malnutrition   . Neutropenia   . UTI (lower urinary tract infection)   . Diabetes mellitus     oral     PSH: Past Surgical History  Procedure Date  . Portacath placement 06/10/11 Powerport    Tip in lower SVC per Dr. Lowella Dandy  . Percutaneous external biliary drain placement 06/07/11   I have reviewed the FH and SH and  If appropriate update it with new information. No Known Allergies Scheduled Meds:   . amLODipine  5 mg Oral Daily  . aspirin  81 mg Oral Daily  .  baclofen  5 mg Oral TID  . dronabinol  10 mg Oral BID AC  . enoxaparin (LOVENOX) injection  120 mg Subcutaneous Q24H  . feeding supplement  237 mL Oral TID WC  . insulin aspart  0-9 Units Subcutaneous TID WC  . linagliptin  5 mg Oral Daily  . lipase/protease/amylase  2 capsule Oral TID AC  . pantoprazole  40 mg Oral Q1200  . piperacillin-tazobactam (ZOSYN)  IV  3.375 g Intravenous Q8H  . potassium chloride  10 mEq Intravenous Q1 Hr x 4  . predniSONE  5 mg Oral QAC breakfast  . sucralfate  1 g Oral QID  . terazosin  5 mg Oral QHS  . DISCONTD: enoxaparin  70 mg Subcutaneous QHS   Continuous Infusions:   . sodium  chloride 100 mL/hr at 08/13/11 0655   PRN Meds:.acetaminophen, acetaminophen, chlorproMAZINE, HYDROcodone-acetaminophen, HYDROmorphone, ondansetron (ZOFRAN) IV, ondansetron, prochlorperazine, promethazine, sodium chloride    BP 113/62  Pulse 63  Temp(Src) 98.7 F (37.1 C) (Oral)  Resp 18  Ht 6' (1.829 m)  Wt 76.204 kg (168 lb)  BMI 22.78 kg/m2  SpO2 100%   PPS:30%   Intake/Output Summary (Last 24 hours) at 08/13/11 1210 Last data filed at 08/13/11 0600  Gross per 24 hour  Intake 2982.5 ml  Output    355 ml  Net 2627.5 ml   LBM:12/4                       Stool Softner: none  Physical Exam:  General: flat affect,  Very quiet HEENT:  PERRL, EOMI,  MM dry Chest:   Decreased but clear  CVS: R,R, R S1 and S2 Abdomen:firm,  No guarding, diffusely mildly tender Ext: trace edema Neuro:Awake, alert, oriented but very depressed.  Labs: CBC    Component Value Date/Time   WBC 15.7* 08/13/2011 0415   WBC 16.1* 08/09/2011 0849   RBC 2.69* 08/13/2011 0415   RBC 3.96* 08/09/2011 0849   HGB 7.3* 08/13/2011 0415   HGB 10.9* 08/09/2011 0849   HCT 22.1* 08/13/2011 0415   HCT 33.0* 08/09/2011 0849   PLT 122* 08/13/2011 0415   PLT 170 08/09/2011 0849   MCV 82.2 08/13/2011 0415   MCV 83.2 08/09/2011 0849   MCH 27.1 08/13/2011 0415   MCH 27.5 08/09/2011 0849   MCHC 33.0 08/13/2011 0415   MCHC 33.0 08/09/2011 0849   RDW 21.0* 08/13/2011 0415   RDW 16.0* 08/09/2011 0849   LYMPHSABS 1.4 08/10/2011 1240   LYMPHSABS 2.4 08/09/2011 0849   MONOABS 2.0* 08/10/2011 1240   MONOABS 1.8* 08/09/2011 0849   EOSABS 0.0 08/10/2011 1240   EOSABS 0.0 08/09/2011 0849   BASOSABS 0.0 08/10/2011 1240   BASOSABS 0.1 08/09/2011 0849     CMP     Component Value Date/Time   NA 139 08/13/2011 0415   K 3.2* 08/13/2011 0415   CL 109 08/13/2011 0415   CO2 21 08/13/2011 0415   GLUCOSE 98 08/13/2011 0415   BUN 18 08/13/2011 0415   CREATININE 1.73* 08/13/2011 0415   CALCIUM 8.3* 08/13/2011 0415   PROT 6.1  08/11/2011 0500   ALBUMIN 2.7* 08/11/2011 0500   AST 31 08/11/2011 0500   ALT 24 08/11/2011 0500   ALKPHOS 172* 08/11/2011 0500   BILITOT 1.7* 08/11/2011 0500   GFRNONAA 38* 08/13/2011 0415   GFRAA 44* 08/13/2011 0415    Chest Xray Reviewed/Impressions: stable no pneumonia  CT scan of the Abd Reviewed/Impressions: reviewed last Ct  showing mets to liver and lung,  Probable PE,  And nodular thyroid    Time In Time Out Total Time Spent with Patient Total Overall Time  1130 am 12:20 pm 20 min 50 min    Greater than 50%  of this time was spent counseling and coordinating care related to the above assessment and plan.   Dollye Glasser L. Ladona Ridgel, MD MBA The Palliative Medicine Team at [facility] Phone: [phone] Pager: [phone]

## 2011-08-13 NOTE — Progress Notes (Signed)
ANTICOAGULATION CONSULT NOTE - Initial Consult  Pharmacy Consult for  1. Lovenox: Hx DVT 2. Zosyn: Leukocytosis  No Known Allergies  Patient Measurements: Height: 6' (182.9 cm) Weight: 168 lb (76.204 kg) (standing scale) IBW/kg (Calculated) : 77.6    Vital Signs: Temp: 98.7 F (37.1 C) (12/04 0542) Temp src: Oral (12/04 0542) BP: 113/62 mmHg (12/04 0542) Pulse Rate: 63  (12/04 0542)  Labs:  Basename 08/13/11 0415 08/12/11 0542 08/11/11 1647 08/11/11 0500  HGB 7.3* 6.5* -- --  HCT 22.1* 19.8* -- 23.2*  PLT 122* 115* -- 131*  APTT -- -- -- --  LABPROT -- -- -- --  INR -- -- -- --  HEPARINUNFRC -- -- -- --  CREATININE 1.73* 2.19* 2.41* --  CKTOTAL -- -- -- --  CKMB -- -- -- --  TROPONINI -- -- -- --   Estimated Creatinine Clearance: 42.8 ml/min (by C-G formula based on Cr of 1.73).  Medical History: Past Medical History  Diagnosis Date  . Pancreas cancer 2012  . Cholestasis 2012  . Liver metastases 2012  . Lung metastases 2012  . BPH (benign prostatic hyperplasia) 2012    with Dr. Alfredo Martinez  . Pulmonary embolism 2012  . Weight loss, non-intentional 2012  . Poor appetite 2012  . GERD (gastroesophageal reflux disease) 06/29/2011  . Pancreatic insufficiency 06/29/2011  . Hypertension 06/29/2011  . Hemorrhoid 06/29/2011  . Protein calorie malnutrition   . Neutropenia   . UTI (lower urinary tract infection)   . Diabetes mellitus     oral    Medications:  Scheduled:     . amLODipine  5 mg Oral Daily  . aspirin  81 mg Oral Daily  . baclofen  5 mg Oral TID  . dronabinol  10 mg Oral BID AC  . enoxaparin  70 mg Subcutaneous QHS  . feeding supplement  237 mL Oral TID WC  . insulin aspart  0-9 Units Subcutaneous TID WC  . linagliptin  5 mg Oral Daily  . lipase/protease/amylase  2 capsule Oral TID AC  . pantoprazole  40 mg Oral Q1200  . piperacillin-tazobactam (ZOSYN)  IV  3.375 g Intravenous Q8H  . potassium chloride  10 mEq Intravenous Q1 Hr x 4    . predniSONE  5 mg Oral QAC breakfast  . sucralfate  1 g Oral QID  . terazosin  5 mg Oral QHS    Assessment: 1. 70 yo M w/hx DVT. On Lovenox 120mg  sq daily PTA, last dose at [facility] 11/30 am. Pharmacy to dose Lovenox while in hospital.  Dose was reduced to 70mg  sq q24h since CrCl was << 43ml/min.  Scr has now improved and CrCl is now 43 ml/min.  Of note, RN reports stool appeared to have 1-2 bloody clots.  CBC remains low, but improved from 12/3.  2. Day #4 Zosyn 3.375gm IV q8h for leukocytosis.  Afeb, WBC remains elevated, blood cx no growth to date, bile fluid growing abundant GPC pairs/chains, few yeast.  Plan:  1. Increase Lovenox back to home dose of 120mg  sq q24h (1.5mg  sq q24h) since renal function has improved.  Continue to monitor closely for bleeding.  2. Continue current Zosyn regimen and monitor renal function closely, adjust as needed.  Loralee Pacas R 08/13/2011,9:28 AM Pager: [phone]

## 2011-08-13 NOTE — Progress Notes (Signed)
Subjective: Nausea and vomiting times one this a.m., complaining of difficulty swallowing, minimal by mouth intake today I Objective: Vital signs in last 24 hours: Temp:  [97.9 F (36.6 C)-98.8 F (37.1 C)] 98.7 F (37.1 C) (12/04 1404) Pulse Rate:  [54-87] 87  (12/04 1404) Resp:  [16-20] 20  (12/04 1404) BP: (97-123)/(53-74) 123/74 mmHg (12/04 1404) SpO2:  [100 %] 100 % (12/04 1404) Weight:  [76.204 kg (168 lb)] 168 lb (76.204 kg) (12/04 0542) Last BM Date: 08/13/11 Intake/Output from previous day: 12/03 0701 - 12/04 0700 In: 3332.5 [P.O.:340; I.V.:2120; Blood:312.5; IV Piggyback:550] Out: 555 [Urine:550; Drains:5] Intake/Output this shift: Total I/O In: 50 [IV Piggyback:50] Out: 100 [Urine:100]    General Appearance:    Alert, chronically ill-appearing. In no distress distress  Lungs:     Clear to auscultation bilaterally, respirations unlabored   Heart:    Regular rate and rhythm, S1 and S2 normal, no murmur, rub   or gallop  Abdomen:     Soft, non-tender, bowel sounds present, drain present in RUQ with  greenish drainage     Extremities:   Extremities normal, atraumatic, no cyanosis or edema  Neurologic:   CNII-XII intact, nonfocal      Weight change: 5.897 kg (13 lb)  Intake/Output Summary (Last 24 hours) at 08/13/11 1425 Last data filed at 08/13/11 1200  Gross per 24 hour  Intake 2182.5 ml  Output    280 ml  Net 1902.5 ml    Lab Results:   [facility] 08/13/11 0415 08/12/11 0542  NA 139 138  K 3.2* 2.6*  CL 109 105  CO2 21 22  GLUCOSE 98 80  BUN 18 26*  CREATININE 1.73* 2.19*  CALCIUM 8.3* 8.5    Basename 08/13/11 0415 08/12/11 0542  WBC 15.7* 15.8*  HGB 7.3* 6.5*  HCT 22.1* 19.8*  PLT 122* 115*  MCV 82.2 82.8   PT/INR No results found for this basename: LABPROT:2,INR:2 in the last 72 hours ABG No results found for this basename: PHART:2,PCO2:2,PO2:2,HCO3:2 in the last 72 hours  Micro Results: Recent Results (from the past 240 hour(s))    CULTURE, BLOOD (ROUTINE X 2)     Status: Normal (Preliminary result)   Collection Time   08/10/11  4:30 PM      Component Value Range Status Comment   Specimen Description BLOOD RIGHT ANTECUBITAL   Final    Special Requests     Final    Value: BOTTLES DRAWN AEROBIC AND ANAEROBIC 6CC AEROBIC, 4 CC ANAEROBIC   Setup Time [id number]   Final    Culture     Final    Value:        BLOOD CULTURE RECEIVED NO GROWTH TO DATE CULTURE WILL BE HELD FOR 5 DAYS BEFORE ISSUING A FINAL NEGATIVE REPORT   Report Status PENDING   Incomplete   CULTURE, BLOOD (ROUTINE X 2)     Status: Normal (Preliminary result)   Collection Time   08/10/11  4:35 PM      Component Value Range Status Comment   Specimen Description BLOOD RIGHT HAND   Final    Special Requests     Final    Value: BOTTLES DRAWN AEROBIC AND ANAEROBIC 6CC AEROBIC, 4CC ANAEROBIC   Setup Time [id number]   Final    Culture     Final    Value:        BLOOD CULTURE RECEIVED NO GROWTH TO DATE CULTURE WILL BE HELD FOR 5 DAYS BEFORE ISSUING A  FINAL NEGATIVE REPORT   Report Status PENDING   Incomplete   BODY FLUID CULTURE     Status: Normal   Collection Time   08/10/11  8:52 PM      Component Value Range Status Comment   Specimen Description COMMON BILE DUCT   Final    Special Requests NONE   Final    Gram Stain     Final    Value: NO WBC SEEN     ABUNDANT GRAM POSITIVE COCCI IN PAIRS AND CHAINS     FEW YEAST   Culture     Final    Value: MULTIPLE ORGANISMS PRESENT, NONE PREDOMINANT     Note: NO STAPHYLOCOCCUS AUREUS ISOLATED NO GROUP A STREP (S.PYOGENES) ISOLATED   Report Status 08/13/2011 FINAL   Final   URINE CULTURE     Status: Normal   Collection Time   08/11/11 11:40 PM      Component Value Range Status Comment   Specimen Description URINE, RANDOM   Final    Special Requests NONE   Final    Setup Time [id number]   Final    Colony Count NO GROWTH   Final    Culture NO GROWTH   Final    Report Status 08/13/2011 FINAL   Final     Studies/Results: No results found. Medications:  Scheduled Meds:    . amLODipine  5 mg Oral Daily  . aspirin  81 mg Oral Daily  . baclofen  5 mg Oral TID  . dronabinol  10 mg Oral BID AC  . enoxaparin (LOVENOX) injection  120 mg Subcutaneous Q24H  . feeding supplement  237 mL Oral TID WC  . insulin aspart  0-9 Units Subcutaneous TID WC  . linagliptin  5 mg Oral Daily  . lipase/protease/amylase  2 capsule Oral TID AC  . pantoprazole  40 mg Oral Q1200  . piperacillin-tazobactam (ZOSYN)  IV  3.375 g Intravenous Q8H  . potassium chloride  10 mEq Intravenous Q1 Hr x 3  . predniSONE  5 mg Oral QAC breakfast  . sucralfate  1 g Oral QID  . terazosin  5 mg Oral QHS  . DISCONTD: enoxaparin  70 mg Subcutaneous QHS  . DISCONTD: potassium chloride  40 mEq Oral Q4H   Continuous Infusions:    . sodium chloride 100 mL/hr at 08/13/11 0655   PRN Meds:.acetaminophen, acetaminophen, chlorproMAZINE, HYDROcodone-acetaminophen, HYDROmorphone, ondansetron (ZOFRAN) IV, ondansetron, prochlorperazine, promethazine, sodium chloride Assessment/Plan:  1. Acute renal failure: likely prerenal - improving with hydration, follow and recheck. 2. Leukocytosis: Gradually  improving on current empiric antibiotics. pt has biliary drain and the Gram stain shows and gram-positive cocci in pairs and chains, and drain cultures as are multiple organisms, none predominant.  drain site nontender and he denies any abd. Pain. CXR w/o acute infiltrates.  - Per discussion with admitting MD, pt received neulasta a few weeks back which may still be a contributing factor to his leukocytosis. 3.Metastatic Pancreatic Ca- appreciate Dr Lodema Pilot input. awaiting Palliative care input. Also Dr Gaylyn Rong to decide on staging CT and order is when appropriate. Continuing Marinol and low dose prednisone better appetite noted today. 4.Cholestasis- secondary to#3, s/p perc. Biliary drain, continue empiric antibiotics.Marland Kitchen 5.HTN- continue norvasc.   6.Diabetes mellitus, type II. - continue outpt meds and cover with SSI. Monitor sugars closely given his renal insuff. 7.Pancreatic insufficiency from pancreatic cancer- continue Creon 1 capsule TID 8.Hypokalemia- replace k, mg level is 1.7 9.? Dysphagia-speech therapy consult for swallow  eval, follow   LOS: 3 days   Shamiracle Gorden C 08/13/2011, 2:25 PM

## 2011-08-13 NOTE — Progress Notes (Signed)
Pt with episode of emesis and also found pt to have been incont. of bowel and bladder. While cleaning pt, noted that stool appeared to have 1-2 bloody clots. Vitals as follows T-98.7 BP-113/62 HR-63 RR-18 Sats-100 RM Air. Since pt is stable at this time we will continue to monitor and then will follow up with the rounding MD in the am. Belinda Fisher

## 2011-08-14 ENCOUNTER — Ambulatory Visit: Payer: Medicare Other

## 2011-08-14 LAB — CBC
HCT: 22.8 % — ABNORMAL LOW (ref 39.0–52.0)
MCHC: 32.9 g/dL (ref 30.0–36.0)
MCV: 82.3 fL (ref 78.0–100.0)
Platelets: 131 10*3/uL — ABNORMAL LOW (ref 150–400)
RDW: 21.4 % — ABNORMAL HIGH (ref 11.5–15.5)
WBC: 19.4 10*3/uL — ABNORMAL HIGH (ref 4.0–10.5)

## 2011-08-14 LAB — BASIC METABOLIC PANEL
BUN: 12 mg/dL (ref 6–23)
Calcium: 8.3 mg/dL — ABNORMAL LOW (ref 8.4–10.5)
Chloride: 112 mEq/L (ref 96–112)
Creatinine, Ser: 1.3 mg/dL (ref 0.50–1.35)
GFR calc Af Amer: 63 mL/min — ABNORMAL LOW (ref >90)
GFR calc non Af Amer: 54 mL/min — ABNORMAL LOW (ref >90)

## 2011-08-14 LAB — PREPARE RBC (CROSSMATCH)

## 2011-08-14 LAB — GLUCOSE, CAPILLARY: Glucose-Capillary: 143 mg/dL — ABNORMAL HIGH (ref 70–99)

## 2011-08-14 MED ORDER — POTASSIUM CHLORIDE CRYS ER 20 MEQ PO TBCR
40.0000 meq | EXTENDED_RELEASE_TABLET | Freq: Once | ORAL | Status: AC
  Administered 2011-08-14: 40 meq via ORAL
  Filled 2011-08-14: qty 2

## 2011-08-14 NOTE — Progress Notes (Signed)
Per PT/OT, patient had episode of symptomatic low blood pressure when moving from bed to chair.  Held morning blood pressure medication prior to PT/OT visit.  Need parameters for blood pressure medication (Norvasc).

## 2011-08-14 NOTE — Progress Notes (Signed)
Occupational Therapy Evaluation Patient Details Name: Mitchell Hines MRN: [id number] DOB: [birth year] Today's Date: 08/14/2011 Time in: 9:59 am Time out: 10:24 am Eval II  Problem List:  Patient Active Problem List  Diagnoses  . Pancreas cancer  . Liver metastases  . Lung metastases  . BPH (benign prostatic hyperplasia)  . Pulmonary embolism  . Weight loss, non-intentional  . Poor appetite  . GERD (gastroesophageal reflux disease)  . Pancreatic insufficiency  . Hypertension  . Hemorrhoid  . Dehydration  . Neutropenia  . Protein calorie malnutrition  . DM type 2 (diabetes mellitus, type 2)  . ARF (acute renal failure)  . Pancytopenia  . Sepsis  . Neutropenia with fever  . Generalized weakness  . Leukocytosis  . Failure to thrive    Past Medical History:  Past Medical History  Diagnosis Date  . Pancreas cancer 2012  . Cholestasis 2012  . Liver metastases 2012  . Lung metastases 2012  . BPH (benign prostatic hyperplasia) 2012    with Dr. Alfredo Martinez  . Pulmonary embolism 2012  . Weight loss, non-intentional 2012  . Poor appetite 2012  . GERD (gastroesophageal reflux disease) 06/29/2011  . Pancreatic insufficiency 06/29/2011  . Hypertension 06/29/2011  . Hemorrhoid 06/29/2011  . Protein calorie malnutrition   . Neutropenia   . UTI (lower urinary tract infection)   . Diabetes mellitus     oral   Past Surgical History:  Past Surgical History  Procedure Date  . Portacath placement 06/10/11 Powerport    Tip in lower SVC per Dr. Lowella Dandy  . Percutaneous external biliary drain placement 06/07/11    OT Assessment/Plan/Recommendation OT Assessment Clinical Impression Statement: patient will benefit from OT services to increase independence with ADL for next venue of care OT Recommendation/Assessment: Patient will need skilled OT in the acute care venue OT Problem List: Decreased strength;Decreased activity tolerance;Decreased knowledge of use of DME or AE OT  Therapy Diagnosis : Generalized weakness OT Plan OT Frequency: Min 2X/week OT Treatment/Interventions: Self-care/ADL training;Therapeutic activities;DME and/or AE instruction;Patient/family education OT Recommendation Follow Up Recommendations: Other (comment);24 hour supervision/assistance;Skilled nursing facility (wife works. Depends on progress and assist available. If patient has 24/7 assist then, HHOT ) Equipment Recommended: Other (comment) (to be assessed) Individuals Consulted Consulted and Agree with Results and Recommendations: Family member/caregiver Family Member Consulted: wife OT Goals Acute Rehab OT Goals OT Goal Formulation: With patient Time For Goal Achievement: 2 weeks ADL Goals Pt Will Perform Grooming: with supervision;Standing at sink ADL Goal: Grooming - Progress: Progressing toward goals Pt Will Perform Upper Body Bathing: with set-up;Unsupported;Sitting, chair;Sitting, edge of bed ADL Goal: Upper Body Bathing - Progress: Progressing toward goals Pt Will Perform Lower Body Bathing: with supervision;with adaptive equipment;Sit to stand from chair;Sit to stand from bed ADL Goal: Lower Body Bathing - Progress: Progressing toward goals Pt Will Perform Upper Body Dressing: with set-up;Sitting, bed;Sitting, chair;Unsupported ADL Goal: Upper Body Dressing - Progress: Progressing toward goals Pt Will Perform Lower Body Dressing: with supervision;with adaptive equipment;Sit to stand from chair;Sit to stand from bed ADL Goal: Lower Body Dressing - Progress: Progressing toward goals Pt Will Transfer to Toilet: with supervision;with DME;Other (comment);Ambulation (DME and assistive device PRN) ADL Goal: Toilet Transfer - Progress: Progressing toward goals Pt Will Perform Toileting - Clothing Manipulation: with supervision;Standing ADL Goal: Toileting - Clothing Manipulation - Progress: Progressing toward goals Pt Will Perform Toileting - Hygiene: with supervision;Sit to stand  from 3-in-1/toilet ADL Goal: Toileting - Hygiene - Progress: Progressing toward goals  OT  Evaluation Precautions/Restrictions  Restrictions Weight Bearing Restrictions: No Prior Functioning Home Living Lives With: Spouse Type of Home: House Home Layout: One level Home Access: Stairs to enter [employer] of Steps: 4 Bathroom Shower/Tub: Network engineer: None Prior Function Level of Independence: Needs assistance with ADLs Bath: Supervision/set-up Dressing: Moderate ADL ADL Grooming: Simulated;Wash/dry hands;Set up Where Assessed - Grooming: Sitting, chair Upper Body Bathing: Simulated;Chest;Right arm;Left arm;Abdomen;Supervision/safety;Set up Where Assessed - Upper Body Bathing: Sitting, chair;Supported Lower Body Bathing: Maximal assistance Lower Body Bathing Details (indicate cue type and reason): due to dizziness in standing, required assist with all periareas after incontinent bowel epiode and lower legs Where Assessed - Lower Body Bathing: Sit to stand from chair Upper Body Dressing: Moderate assistance;Simulated Upper Body Dressing Details (indicate cue type and reason): manage gown with drain Where Assessed - Upper Body Dressing: Sitting, chair;Supported Lower Body Dressing: Simulated;Maximal assistance Lower Body Dressing Details (indicate cue type and reason): max assist with depends to start them over his feet (wife assisted with this at home for the last several weeks) pt able to help pull them up some but needed assist to manage depends around drain Where Assessed - Lower Body Dressing: Sit to stand from chair Toilet Transfer: Not assessed Toilet Transfer Method: Not assessed Toileting - Clothing Manipulation: Simulated;Moderate assistance Toileting - Clothing Manipulation Details (indicate cue type and reason): simulated with depends Where Assessed - Glass blower/designer Manipulation: Standing Toileting -  Hygiene: Simulated;+1 Total assistance Toileting - Hygiene Details (indicate cue type and reason): incontinet of bowel and pt dizzy in standing and required full assist Where Assessed - Toileting Hygiene: Standing Tub/Shower Transfer: Not assessed Tub/Shower Transfer Method: Not assessed ADL Comments: Patient only tolerated standing twice for cleaning up bowel movement and changing depends due to dizziness. Nursing made aware. Assisted patient to recline in chair and he states dizziness ok once reclining back in chair. Vision/Perception  Vision - History Baseline Vision: Wears glasses only for reading Cognition Cognition Arousal/Alertness: Awake/alert Overall Cognitive Status: Appears within functional limits for tasks assessed Orientation Level: Oriented X4 Sensation/Coordination Sensation Light Touch: Appears Intact Coordination Gross Motor Movements are Fluid and Coordinated: Yes Extremity Assessment RUE Assessment RUE Assessment: Exceptions to [facility] RUE AROM (degrees) RUE Overall AROM Comments: elbow distal strength 4+/5 Right Shoulder Flexion  0-170: 100 Degrees LUE Assessment LUE Assessment: Exceptions to WFL LUE AROM (degrees) Left Shoulder Flexion  0-170: 150 Degrees (elbow distal 4+/5) Mobility  Bed Mobility Bed Mobility: No (pt up in chair upon arrival) Transfers Sit to Stand: 4: Min assist Sit to Stand Details (indicate cue type and reason): min guard asssit Stand to Sit: 4: Min assist (min/guard) Exercises   End of Session OT - End of Session Activity Tolerance: Other (comment) (limited by dizziness and decreased BP) Patient left: in chair;with call bell in reach;with family/visitor present Nurse Communication: Mobility status for transfers General Behavior During Session: Flat affect Cognition: [facility] for tasks performed   Lennox Laity [phone] 08/14/2011, 11:38 AM

## 2011-08-14 NOTE — Progress Notes (Signed)
Subjective: Feels better, pain free, eating, no new issues.  Objective: Vital signs in last 24 hours: Temp:  [98.4 F (36.9 C)-98.5 F (36.9 C)] 98.4 F (36.9 C) (12/05 1430) Pulse Rate:  [57-108] 66  (12/05 1430) Resp:  [16-18] 18  (12/05 1430) BP: (98-109)/(52-65) 98/58 mmHg (12/05 1430) SpO2:  [100 %] 100 % (12/05 1430) Weight:  [77.202 kg (170 lb 3.2 oz)] 170 lb 3.2 oz (77.202 kg) (12/05 0559) Weight change: 0.998 kg (2 lb 3.2 oz) Last BM Date: 08/14/11  Intake/Output from previous day: 12/04 0701 - 12/05 0700 In: 1601.7 [I.V.:1201.7; IV Piggyback:400] Out: 572 [Urine:551; Drains:20; Stool:1]     Physical Exam:  General: Comfortable, alert, communicative, fully oriented, not short of breath at rest.  HEENT:  Moderate clinical pallor, no jaundice, no conjunctival injection or discharge. NECK:  Supple, JVP not seen, no carotid bruits, no palpable lymphadenopathy, no palpable goiter. CHEST:  Clinically clear to auscultation, no wheezes, no crackles. HEART:  Sounds 1 and 2 heard, normal, regular, no murmurs. ABDOMEN:  Full, soft, non-tender, no clinical ascites, normal bowel sounds. GENITALIA:  Not examined. LOWER EXTREMITIES:  No pitting edema, palpable peripheral pulses. MUSCULOSKELETAL SYSTEM:  Generalized osteoarthritic changes, otherwise, normal. CENTRAL NERVOUS SYSTEM:  No focal neurologic deficit on gross examination.  Lab Results:  [facility] 08/14/11 0550 08/13/11 0415  WBC 19.4* 15.7*  HGB 7.5* 7.3*  HCT 22.8* 22.1*  PLT 131* 122*    Basename 08/14/11 0550 08/13/11 0415  NA 141 139  K 3.4* 3.2*  CL 112 109  CO2 22 21  GLUCOSE 84 98  BUN 12 18  CREATININE 1.30 1.73*  CALCIUM 8.3* 8.3*   Recent Results (from the past 240 hour(s))  CULTURE, BLOOD (ROUTINE X 2)     Status: Normal (Preliminary result)   Collection Time   08/10/11  4:30 PM      Component Value Range Status Comment   Specimen Description BLOOD RIGHT ANTECUBITAL   Final    Special Requests      Final    Value: BOTTLES DRAWN AEROBIC AND ANAEROBIC 6CC AEROBIC, 4 CC ANAEROBIC   Setup Time [id number]   Final    Culture     Final    Value:        BLOOD CULTURE RECEIVED NO GROWTH TO DATE CULTURE WILL BE HELD FOR 5 DAYS BEFORE ISSUING A FINAL NEGATIVE REPORT   Report Status PENDING   Incomplete   CULTURE, BLOOD (ROUTINE X 2)     Status: Normal (Preliminary result)   Collection Time   08/10/11  4:35 PM      Component Value Range Status Comment   Specimen Description BLOOD RIGHT HAND   Final    Special Requests     Final    Value: BOTTLES DRAWN AEROBIC AND ANAEROBIC 6CC AEROBIC, 4CC ANAEROBIC   Setup Time [id number]   Final    Culture     Final    Value:        BLOOD CULTURE RECEIVED NO GROWTH TO DATE CULTURE WILL BE HELD FOR 5 DAYS BEFORE ISSUING A FINAL NEGATIVE REPORT   Report Status PENDING   Incomplete   BODY FLUID CULTURE     Status: Normal   Collection Time   08/10/11  8:52 PM      Component Value Range Status Comment   Specimen Description COMMON BILE DUCT   Final    Special Requests NONE   Final    Gram Stain  Final    Value: NO WBC SEEN     ABUNDANT GRAM POSITIVE COCCI IN PAIRS AND CHAINS     FEW YEAST   Culture     Final    Value: MULTIPLE ORGANISMS PRESENT, NONE PREDOMINANT     Note: NO STAPHYLOCOCCUS AUREUS ISOLATED NO GROUP A STREP (S.PYOGENES) ISOLATED   Report Status 08/13/2011 FINAL   Final   URINE CULTURE     Status: Normal   Collection Time   08/11/11 11:40 PM      Component Value Range Status Comment   Specimen Description URINE, RANDOM   Final    Special Requests NONE   Final    Setup Time [id number]   Final    Colony Count NO GROWTH   Final    Culture NO GROWTH   Final    Report Status 08/13/2011 FINAL   Final      Studies/Results: No results found.  Medications: Scheduled Meds:   . amLODipine  5 mg Oral Daily  . aspirin  81 mg Oral Daily  . baclofen  5 mg Oral TID  . dronabinol  10 mg Oral BID AC  . enoxaparin (LOVENOX) injection   120 mg Subcutaneous Q24H  . feeding supplement  237 mL Oral TID WC  . hydrocortisone   Rectal TID  . insulin aspart  0-9 Units Subcutaneous TID WC  . linagliptin  5 mg Oral Daily  . lipase/protease/amylase  2 capsule Oral TID AC  . pantoprazole  40 mg Oral Q1200  . piperacillin-tazobactam (ZOSYN)  IV  3.375 g Intravenous Q8H  . predniSONE  5 mg Oral QAC breakfast  . senna-docusate  1 tablet Oral Daily  . sucralfate  1 g Oral QID  . terazosin  5 mg Oral QHS   Continuous Infusions:   . sodium chloride 100 mL/hr at 08/13/11 1801   PRN Meds:.acetaminophen, acetaminophen, bisacodyl, chlorproMAZINE, HYDROcodone-acetaminophen, HYDROmorphone, ondansetron (ZOFRAN) IV, ondansetron, prochlorperazine, promethazine, sodium chloride  Assessment/Plan:  Principal Problem:  *ARF (acute renal failure): Prerenal, resolved. Will continue maintenance ivi fluids today, and possibly discontinue on 08/15/11.  Active Problems:  1. Leukocytosis: On empiric antibiotics, ie, Zosyn. Biliary drain in-situ, and the gram stain shows and gram-positive cocci in pairs and chains/cultures grew multiple organisms, none predominant. Drain site nontender and he denies any abd. Pain. CXR is unremarkable. Neulasta may be contributory..  2. Metastatic Pancreatic Ca: Manage per  Oncologist (Dr Gaylyn Rong). Seen by Palliative care on 08/13/11. Still full code. Dr Gaylyn Rong to decide on staging CT and order when appropriate. Continued on Marinol and low dose Prednisone.  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  4.HTN: BP is borderline low today. Discontinue Norvasc.Marland Kitchen  5.Diabetes mellitus, type II: Controlled. On SSI. 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 7.Hypokalemia: Repleted as appropriate. 8. Query dysphagia: Speech therapist has recommended regular diet.  9. Anemia. Will transfuse one unit of PRBC today.    LOS: 4 days   Mitchell Hines,Mitchell Hines 08/14/2011, 7:09  PM

## 2011-08-14 NOTE — Progress Notes (Signed)
Physical Therapy Evaluation Patient Details Name: Mitchell Hines MRN: [id number] DOB: September 16, 1940 Today's Date: 08/14/2011 9:59-10:25, ev2  Problem List:  Patient Active Problem List  Diagnoses  . Pancreas cancer  . Liver metastases  . Lung metastases  . BPH (benign prostatic hyperplasia)  . Pulmonary embolism  . Weight loss, non-intentional  . Poor appetite  . GERD (gastroesophageal reflux disease)  . Pancreatic insufficiency  . Hypertension  . Hemorrhoid  . Dehydration  . Neutropenia  . Protein calorie malnutrition  . DM type 2 (diabetes mellitus, type 2)  . ARF (acute renal failure)  . Pancytopenia  . Sepsis  . Neutropenia with fever  . Generalized weakness  . Leukocytosis  . Failure to thrive    Past Medical History:  Past Medical History  Diagnosis Date  . Pancreas cancer 2012  . Cholestasis 2012  . Liver metastases 2012  . Lung metastases 2012  . BPH (benign prostatic hyperplasia) 2012    with Dr. Alfredo Martinez  . Pulmonary embolism 2012  . Weight loss, non-intentional 2012  . Poor appetite 2012  . GERD (gastroesophageal reflux disease) 06/29/2011  . Pancreatic insufficiency 06/29/2011  . Hypertension 06/29/2011  . Hemorrhoid 06/29/2011  . Protein calorie malnutrition   . Neutropenia   . UTI (lower urinary tract infection)   . Diabetes mellitus     oral   Past Surgical History:  Past Surgical History  Procedure Date  . Portacath placement 06/10/11 [facility]port    Tip in lower SVC per Dr. Lowella Dandy  . Percutaneous external biliary drain placement 06/07/11    PT Assessment/Plan/Recommendation PT Assessment Clinical Impression Statement: 70 BM with metastatic pancreatic CA admitted with ARF.  Pt's performance was limited by dizziness and low BP. He stood with MIN/guard A, but due to his dizziness and low BP, was unable to ambulate.  Will need to assess gait once dizziness subsides to get a better picture of his mobility status for d/c recommendations.  Wife  works during the day, and she reports they don't have any other help available.  Will follow acutely.   PT Recommendation/Assessment: Patient will need skilled PT in the acute care venue PT Problem List: Decreased activity tolerance;Decreased mobility Barriers to Discharge: Decreased caregiver support PT Therapy Diagnosis : Generalized weakness PT Plan PT Frequency: Min 3X/week PT Treatment/Interventions: Gait training;Stair training;Functional mobility training;Therapeutic activities;Therapeutic exercise;Patient/family education PT Recommendation Follow Up Recommendations: Home health PT Equipment Recommended:  (continue to assess) PT Goals  Acute Rehab PT Goals PT Goal Formulation: With patient Time For Goal Achievement: 2 weeks Pt will go Supine/Side to Sit: with modified independence PT Goal: Supine/Side to Sit - Progress: Other (comment) (not addressed) Pt will Stand: with modified independence PT Goal: Stand - Progress: Progressing toward goal Pt will Ambulate: 51 - 150 feet;with least restrictive assistive device;with modified independence PT Goal: Ambulate - Progress:  (not addressed) Pt will Go Up / Down Stairs: 3-5 stairs;with supervision;with rail(s) PT Goal: Up/Down Stairs - Progress:  (not addressed) Pt will Perform Home Exercise Program: with supervision, verbal cues required/provided PT Goal: Perform Home Exercise Program - Progress:  (not addressed)  PT Evaluation Precautions/Restrictions  Restrictions Weight Bearing Restrictions: No Prior Functioning  Home Living Lives With: Spouse Type of Home: House Home Layout: One level Home Access: Stairs to enter [employer] of Steps: 4 Bathroom Shower/Tub: Teacher, adult education: None Prior Function Level of Independence: Independent with gait Cognition Cognition Arousal/Alertness: Awake/alert (flat affect) Overall Cognitive Status: Appears within functional limits for  tasks  assessed Orientation Level: Oriented X4 Sensation/Coordination Sensation Light Touch: Appears Intact Coordination Gross Motor Movements are Fluid and Coordinated: Yes Extremity Assessment RLE Assessment RLE Assessment: Within Functional Limits (questionable endurance) LLE Assessment LLE Assessment: Within Functional Limits (questionable endurace) Mobility (including Balance) Bed Mobility Bed Mobility: No (pt up in chair upon arrival) Transfers Transfers: Yes Sit to Stand: From chair/3-in-1;4: Min assist (min/guard) Sit to Stand Details (indicate cue type and reason): Pt stood ~30 secs and became dizzy, BP 103/65 HR 98 once back down in recliner.  Pt stood again for 2 mins for cleaning from BM and then sat with dizziness BP 101/64, HR 108.  Nursing informed.  Deferred gait due to dizziness. Stand to Sit: 4: Min assist (min/guard) Ambulation/Gait Ambulation/Gait: No (secondary to dizziness)        End of Session PT - End of Session Activity Tolerance: Treatment limited secondary to medical complications (Comment) (dizziness and low BP) Patient left: in chair;with call bell in reach;with family/visitor present Nurse Communication: Mobility status for transfers;Other (comment) (BP and dizziness) General Behavior During Session: Flat affect Cognition: WFL for tasks performed  [facility] LUBECK 08/14/2011, 11:17 AM

## 2011-08-15 ENCOUNTER — Ambulatory Visit: Payer: Medicare Other

## 2011-08-15 DIAGNOSIS — D72819 Decreased white blood cell count, unspecified: Secondary | ICD-10-CM

## 2011-08-15 LAB — GLUCOSE, CAPILLARY
Glucose-Capillary: 101 mg/dL — ABNORMAL HIGH (ref 70–99)
Glucose-Capillary: 86 mg/dL (ref 70–99)
Glucose-Capillary: 90 mg/dL (ref 70–99)

## 2011-08-15 LAB — CBC
HCT: 26.7 % — ABNORMAL LOW (ref 39.0–52.0)
Hemoglobin: 9 g/dL — ABNORMAL LOW (ref 13.0–17.0)
MCHC: 33.7 g/dL (ref 30.0–36.0)
RBC: 3.19 MIL/uL — ABNORMAL LOW (ref 4.22–5.81)
WBC: 29.8 10*3/uL — ABNORMAL HIGH (ref 4.0–10.5)

## 2011-08-15 LAB — BASIC METABOLIC PANEL
BUN: 10 mg/dL (ref 6–23)
Chloride: 111 mEq/L (ref 96–112)
GFR calc non Af Amer: 63 mL/min — ABNORMAL LOW (ref >90)
Glucose, Bld: 101 mg/dL — ABNORMAL HIGH (ref 70–99)
Potassium: 3.8 mEq/L (ref 3.5–5.1)
Sodium: 139 mEq/L (ref 135–145)

## 2011-08-15 NOTE — Progress Notes (Signed)
08/15/11  0145 Began transfusing one unit of PRBC. Transfusion ended at 0430. Pts had no s/s of reaction. While pulse was 47, other VS were stable. Mechele Collin

## 2011-08-15 NOTE — Progress Notes (Signed)
Subjective: Pain free, eating albeit suboptimally, no new issues otherwise.  Objective: Vital signs in last 24 hours: Temp:  [98.1 F (36.7 C)-98.4 F (36.9 C)] 98.1 F (36.7 C) (12/06 1407) Pulse Rate:  [47-52] 47  (12/06 1407) Resp:  [14-18] 14  (12/06 1407) BP: (109-128)/(55-68) 111/62 mmHg (12/06 1407) SpO2:  [100 %] 100 % (12/06 1407) Weight:  [76.93 kg (169 lb 9.6 oz)] 169 lb 9.6 oz (76.93 kg) (12/06 0622) Weight change: -0.272 kg (-9.6 oz) Last BM Date: 08/14/11  Intake/Output from previous day: 12/05 0701 - 12/06 0700 In: 2252.5 [P.O.:480; I.V.:1320; Blood:302.5; IV Piggyback:150] Out: 1020 [Urine:475; Drains:545] Total I/O In: 50 [P.O.:50] Out: 375 [Urine:200; Drains:175]   Physical Exam:  General: Comfortable, alert, communicative, fully oriented, not short of breath at rest.  HEENT:  Mild clinical pallor, no jaundice, no conjunctival injection or discharge. NECK:  Supple, JVP not seen, no carotid bruits, no palpable lymphadenopathy, no palpable goiter. CHEST:  Clinically clear to auscultation, no wheezes, no crackles. HEART:  Sounds 1 and 2 heard, normal, regular, no murmurs. ABDOMEN:  Full, soft, non-tender, no clinical ascites, normal bowel sounds. GENITALIA:  Not examined. LOWER EXTREMITIES:  No pitting edema, palpable peripheral pulses. MUSCULOSKELETAL SYSTEM:  Generalized osteoarthritic changes, otherwise, normal. CENTRAL NERVOUS SYSTEM:  No focal neurologic deficit on gross examination.  Lab Results:  [facility] 08/15/11 0550 08/14/11 0550  WBC 29.8* 19.4*  HGB 9.0* 7.5*  HCT 26.7* 22.8*  PLT 129* 131*    Basename 08/15/11 0550 08/14/11 0550  NA 139 141  K 3.8 3.4*  CL 111 112  CO2 23 22  GLUCOSE 101* 84  BUN 10 12  CREATININE 1.14 1.30  CALCIUM 8.6 8.3*   Recent Results (from the past 240 hour(s))  CULTURE, BLOOD (ROUTINE X 2)     Status: Normal (Preliminary result)   Collection Time   08/10/11  4:30 PM      Component Value Range Status  Comment   Specimen Description BLOOD RIGHT ANTECUBITAL   Final    Special Requests     Final    Value: BOTTLES DRAWN AEROBIC AND ANAEROBIC 6CC AEROBIC, 4 CC ANAEROBIC   Setup Time [id number]   Final    Culture     Final    Value:        BLOOD CULTURE RECEIVED NO GROWTH TO DATE CULTURE WILL BE HELD FOR 5 DAYS BEFORE ISSUING A FINAL NEGATIVE REPORT   Report Status PENDING   Incomplete   CULTURE, BLOOD (ROUTINE X 2)     Status: Normal (Preliminary result)   Collection Time   08/10/11  4:35 PM      Component Value Range Status Comment   Specimen Description BLOOD RIGHT HAND   Final    Special Requests     Final    Value: BOTTLES DRAWN AEROBIC AND ANAEROBIC 6CC AEROBIC, 4CC ANAEROBIC   Setup Time [id number]   Final    Culture     Final    Value:        BLOOD CULTURE RECEIVED NO GROWTH TO DATE CULTURE WILL BE HELD FOR 5 DAYS BEFORE ISSUING A FINAL NEGATIVE REPORT   Report Status PENDING   Incomplete   BODY FLUID CULTURE     Status: Normal   Collection Time   08/10/11  8:52 PM      Component Value Range Status Comment   Specimen Description COMMON BILE DUCT   Final    Special Requests NONE   Final  Gram Stain     Final    Value: NO WBC SEEN     ABUNDANT GRAM POSITIVE COCCI IN PAIRS AND CHAINS     FEW YEAST   Culture     Final    Value: MULTIPLE ORGANISMS PRESENT, NONE PREDOMINANT     Note: NO STAPHYLOCOCCUS AUREUS ISOLATED NO GROUP A STREP (S.PYOGENES) ISOLATED   Report Status 08/13/2011 FINAL   Final   URINE CULTURE     Status: Normal   Collection Time   08/11/11 11:40 PM      Component Value Range Status Comment   Specimen Description URINE, RANDOM   Final    Special Requests NONE   Final    Setup Time [id number]   Final    Colony Count NO GROWTH   Final    Culture NO GROWTH   Final    Report Status 08/13/2011 FINAL   Final      Studies/Results: No results found.  Medications: Scheduled Meds:    . aspirin  81 mg Oral Daily  . baclofen  5 mg Oral TID  . dronabinol   10 mg Oral BID AC  . enoxaparin (LOVENOX) injection  120 mg Subcutaneous Q24H  . feeding supplement  237 mL Oral TID WC  . hydrocortisone   Rectal TID  . insulin aspart  0-9 Units Subcutaneous TID WC  . linagliptin  5 mg Oral Daily  . lipase/protease/amylase  2 capsule Oral TID AC  . pantoprazole  40 mg Oral Q1200  . piperacillin-tazobactam (ZOSYN)  IV  3.375 g Intravenous Q8H  . potassium chloride  40 mEq Oral Once  . predniSONE  5 mg Oral QAC breakfast  . senna-docusate  1 tablet Oral Daily  . sucralfate  1 g Oral QID  . terazosin  5 mg Oral QHS  . DISCONTD: amLODipine  5 mg Oral Daily   Continuous Infusions:    . sodium chloride 100 mL/hr at 08/15/11 0700   PRN Meds:.acetaminophen, acetaminophen, bisacodyl, chlorproMAZINE, HYDROcodone-acetaminophen, HYDROmorphone, ondansetron (ZOFRAN) IV, ondansetron, prochlorperazine, promethazine, sodium chloride  Assessment/Plan:  Principal Problem:  *ARF (acute renal failure): Prerenal, resolved. Have discontinued iv fluids today.  Active Problems:  1. Leukocytosis: On empiric antibiotics, ie, Zosyn, day#5. Biliary drain in-situ, and the gram stain shows and gram-positive cocci in pairs and chains/cultures grew multiple organisms, none predominant. Drain site nontender and he denies any abd. Pain. CXR is unremarkable. Neulasta may be contributory.m I have discussed with Dr Truett Perna covering for Dr Gaylyn Rong, and he concurs that this may indeed, be the case, if infection is definitely excluded. We shall continue to monitor, and consult ID, with regards to antibiotic therapy. 2. Metastatic Pancreatic Ca: Manage per  Oncologist (Dr Gaylyn Rong). Seen by Palliative care on 08/13/11. Still full code. Dr Gaylyn Rong to decide on staging CT and order when appropriate. For now, on Marinol and low dose Prednisone.  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  4.HTN: BP is normal, off Norvasc. Transient hypotension has resolved.Marland Kitchen  5.Diabetes  mellitus, type II: Controlled. On SSI. 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 7.Hypokalemia: Repleted as appropriate. 8. Query dysphagia: Speech therapist has recommended regular diet.  9. Anemia. Hb now reasonable, status post transfusion of second unit PRBC, on 08/14/11.    LOS: 5 days   Oberia Beaudoin,[name] 08/15/2011, 3:40 PM

## 2011-08-15 NOTE — Consult Note (Signed)
Infectious Diseases Initial Consultation               Day 4 Zosyn  Date of Admission:  08/10/2011  Date of Consult:  08/15/2011  Reason for Consult: Determine duration of antibiotic therapy. Referring Physician: Dr. Isidor Holts   Problem List:  Principal Problem:  *ARF (acute renal failure) Active Problems:  Pancreas cancer  Generalized weakness  Leukocytosis  Failure to thrive   Recommendations: 1. Discontinue Zosyn and observe off of antibiotics   Assessment: I do not see any clear evidence of active infection at this time. I suspect that his weakness was new to dehydration and prerenal azotemia. His leukocytosis is probably related to the Neulasta. He has not had any fever and the positive cultures showing multiple organisms from his biliary drain are likely to represent asymptomatic colonization of the drainage bag. Urine and blood cultures are negative and his chest x-ray is clear. I favor stopping Zosyn and observing off of antibiotics at this time.   HPI: Mitchell Hines is a 70 y.o. male with pancreatic cancer currently undergoing chemotherapy who was admitted to the hospital several days ago due to progressive weakness. He was afebrile but was found to have leukocytosis with a white count around 17,000. He also had acute renal insufficiency and there was concern for possible early sepsis so cultures were obtained from urine, blood and his biliary catheter and he was started on empiric Zosyn. He denies having any fever, chills or sweats at home recently. He did admit to decreased by mouth intake leading up to his admission.  He has had a right upper quadrant biliary drain in place for several months now as well as a Port-A-Cath. He has not noticed any change in the output from the biliary drain recently. He has been receiving Neulasta along with his chemotherapy recently.   Review of Systems: Constitutional: positive for anorexia, fatigue and weight loss, negative for  chills, fevers and sweats Respiratory: negative for cough and sputum Cardiovascular: negative Gastrointestinal: negative for diarrhea, nausea and vomiting And he denies any problems with his Port-A-Cath     . aspirin  81 mg Oral Daily  . baclofen  5 mg Oral TID  . dronabinol  10 mg Oral BID AC  . enoxaparin (LOVENOX) injection  120 mg Subcutaneous Q24H  . feeding supplement  237 mL Oral TID WC  . hydrocortisone   Rectal TID  . insulin aspart  0-9 Units Subcutaneous TID WC  . linagliptin  5 mg Oral Daily  . lipase/protease/amylase  2 capsule Oral TID AC  . pantoprazole  40 mg Oral Q1200  . potassium chloride  40 mEq Oral Once  . predniSONE  5 mg Oral QAC breakfast  . senna-docusate  1 tablet Oral Daily  . sucralfate  1 g Oral QID  . terazosin  5 mg Oral QHS  . DISCONTD: amLODipine  5 mg Oral Daily  . DISCONTD: piperacillin-tazobactam (ZOSYN)  IV  3.375 g Intravenous Q8H    Past Medical History  Diagnosis Date  . Pancreas cancer 2012  . Cholestasis 2012  . Liver metastases 2012  . Lung metastases 2012  . BPH (benign prostatic hyperplasia) 2012    with Dr. Alfredo Martinez  . Pulmonary embolism 2012  . Weight loss, non-intentional 2012  . Poor appetite 2012  . GERD (gastroesophageal reflux disease) 06/29/2011  . Pancreatic insufficiency 06/29/2011  . Hypertension 06/29/2011  . Hemorrhoid 06/29/2011  . Protein calorie malnutrition   . Neutropenia   .  UTI (lower urinary tract infection)   . Diabetes mellitus     oral    History  Substance Use Topics  . Smoking status: Former Smoker -- 1.0 packs/day for 30 years    Types: Cigarettes    Quit date: 07/21/1981  . Smokeless tobacco: Never Used  . Alcohol Use: No    Family History  Problem Relation Age of Onset  . Cervical cancer Mother   . Diabetes type II Mother   . Stroke Father   . Pancreatic cancer Brother   . Coronary artery disease Brother    No Known Allergies  OBJECTIVE: Blood pressure 111/62, pulse  47, temperature 98.1 F (36.7 C), temperature source Oral, resp. rate 14, height 6' (1.829 m), weight 76.93 kg (169 lb 9.6 oz), SpO2 100.00%. General: He is alert and in no distress reading on his tablet computer. His Port-A-Cath site is normal. Skin: He has no rash Lungs: Clear Cor: Slow regular S1 and S2 with no murmurs Abdomen: Soft with only moderate right upper quadrant tenderness. There is a biliary drain attached to a drain bag is full bilious green fluid.    Results for orders placed during the hospital encounter of 08/10/11 (from the past 48 hour(s))  GLUCOSE, CAPILLARY     Status: Abnormal   Collection Time   08/13/11  9:35 PM      Component Value Range Comment   Glucose-Capillary 122 (*) 70 - 99 (mg/dL)    Comment 1 Documented in Chart      Comment 2 Notify RN     CBC     Status: Abnormal   Collection Time   08/14/11  5:50 AM      Component Value Range Comment   WBC 19.4 (*) 4.0 - 10.5 (K/uL)    RBC 2.77 (*) 4.22 - 5.81 (MIL/uL)    Hemoglobin 7.5 (*) 13.0 - 17.0 (g/dL)    HCT 16.1 (*) 09.6 - 52.0 (%)    MCV 82.3  78.0 - 100.0 (fL)    MCH 27.1  26.0 - 34.0 (pg)    MCHC 32.9  30.0 - 36.0 (g/dL)    RDW 04.5 (*) 40.9 - 15.5 (%)    Platelets 131 (*) 150 - 400 (K/uL)   BASIC METABOLIC PANEL     Status: Abnormal   Collection Time   08/14/11  5:50 AM      Component Value Range Comment   Sodium 141  135 - 145 (mEq/L)    Potassium 3.4 (*) 3.5 - 5.1 (mEq/L)    Chloride 112  96 - 112 (mEq/L)    CO2 22  19 - 32 (mEq/L)    Glucose, Bld 84  70 - 99 (mg/dL)    BUN 12  6 - 23 (mg/dL)    Creatinine, Ser 8.11  0.50 - 1.35 (mg/dL)    Calcium 8.3 (*) 8.4 - 10.5 (mg/dL)    GFR calc non Af Amer 54 (*) >90 (mL/min)    GFR calc Af Amer 63 (*) >90 (mL/min)   GLUCOSE, CAPILLARY     Status: Normal   Collection Time   08/14/11  7:35 AM      Component Value Range Comment   Glucose-Capillary 96  70 - 99 (mg/dL)   GLUCOSE, CAPILLARY     Status: Abnormal   Collection Time   08/14/11 11:42 AM       Component Value Range Comment   Glucose-Capillary 121 (*) 70 - 99 (mg/dL)   GLUCOSE, CAPILLARY  Status: Abnormal   Collection Time   08/14/11  6:08 PM      Component Value Range Comment   Glucose-Capillary 143 (*) 70 - 99 (mg/dL)   PREPARE RBC (CROSSMATCH)     Status: Normal   Collection Time   08/14/11  8:00 PM      Component Value Range Comment   Order Confirmation ORDER PROCESSED BY BLOOD BANK     GLUCOSE, CAPILLARY     Status: Abnormal   Collection Time   08/14/11  9:29 PM      Component Value Range Comment   Glucose-Capillary 136 (*) 70 - 99 (mg/dL)    Comment 1 Documented in Chart      Comment 2 Notify RN     CBC     Status: Abnormal   Collection Time   08/15/11  5:50 AM      Component Value Range Comment   WBC 29.8 (*) 4.0 - 10.5 (K/uL)    RBC 3.19 (*) 4.22 - 5.81 (MIL/uL)    Hemoglobin 9.0 (*) 13.0 - 17.0 (g/dL)    HCT 84.6 (*) 96.2 - 52.0 (%)    MCV 83.7  78.0 - 100.0 (fL)    MCH 28.2  26.0 - 34.0 (pg)    MCHC 33.7  30.0 - 36.0 (g/dL)    RDW 95.2 (*) 84.1 - 15.5 (%)    Platelets 129 (*) 150 - 400 (K/uL)   BASIC METABOLIC PANEL     Status: Abnormal   Collection Time   08/15/11  5:50 AM      Component Value Range Comment   Sodium 139  135 - 145 (mEq/L)    Potassium 3.8  3.5 - 5.1 (mEq/L)    Chloride 111  96 - 112 (mEq/L)    CO2 23  19 - 32 (mEq/L)    Glucose, Bld 101 (*) 70 - 99 (mg/dL)    BUN 10  6 - 23 (mg/dL)    Creatinine, Ser 3.24  0.50 - 1.35 (mg/dL)    Calcium 8.6  8.4 - 10.5 (mg/dL)    GFR calc non Af Amer 63 (*) >90 (mL/min)    GFR calc Af Amer 73 (*) >90 (mL/min)   GLUCOSE, CAPILLARY     Status: Abnormal   Collection Time   08/15/11  7:07 AM      Component Value Range Comment   Glucose-Capillary 101 (*) 70 - 99 (mg/dL)    Comment 1 Documented in Chart      Comment 2 Notify RN     GLUCOSE, CAPILLARY     Status: Normal   Collection Time   08/15/11 11:25 AM      Component Value Range Comment   Glucose-Capillary 86  70 - 99 (mg/dL)         Component Value Date/Time   SDES URINE, RANDOM 08/11/2011 2340   SPECREQUEST NONE 08/11/2011 2340   CULT NO GROWTH 08/11/2011 2340   REPTSTATUS 08/13/2011 FINAL 08/11/2011 2340   No results found.  In[facility] [phone] 08/15/2011, 5:00 PM

## 2011-08-15 NOTE — Progress Notes (Signed)
08/15/2011 Palliative Medicine Team SW 4:45 PM Pt referred in PMT Rounds for psychosocial support. Met pt, wife and SIL at bedside. Pt self-describes as "quiet and private" and makes little eye contact, but was cooperative and agreeable to interview. Family confirm that pt's mood/affect are not significantly different from his baseline. Pt also admits that no one outside of immediate nuclear family are even aware that he is ill.   Pt reports frustration and anger about current illness and wife nods in agreement they have begun having the "difficult conversations" about EOL. 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 Pt states he is wanting to have more information about results of chemotherapy progress and blood tests before making further decisions regarding his care.   Affirmed individual nature of processing serious illness and offered emotional support to pt and wife. Empathized with pt's feelings of not being in control and with being outside of his familiar environment. Encouraged self-care and provided contact information for additional emotional needs as they arise. Pt and wife thanked me for visit, are open to follow up.   Kennieth Francois, [place] Pager [phone]

## 2011-08-16 ENCOUNTER — Ambulatory Visit: Payer: Medicare Other

## 2011-08-16 DIAGNOSIS — D72829 Elevated white blood cell count, unspecified: Secondary | ICD-10-CM

## 2011-08-16 LAB — TYPE AND SCREEN
Unit division: 0
Unit division: 0

## 2011-08-16 LAB — CBC
MCV: 82.7 fL (ref 78.0–100.0)
Platelets: 143 10*3/uL — ABNORMAL LOW (ref 150–400)
RDW: 20.9 % — ABNORMAL HIGH (ref 11.5–15.5)
WBC: 29.5 10*3/uL — ABNORMAL HIGH (ref 4.0–10.5)

## 2011-08-16 LAB — GLUCOSE, CAPILLARY
Glucose-Capillary: 88 mg/dL (ref 70–99)
Glucose-Capillary: 100 mg/dL — ABNORMAL HIGH (ref 70–99)

## 2011-08-16 LAB — COMPREHENSIVE METABOLIC PANEL
AST: 22 U/L (ref 0–37)
Albumin: 2.1 g/dL — ABNORMAL LOW (ref 3.5–5.2)
Chloride: 109 mEq/L (ref 96–112)
Creatinine, Ser: 1.01 mg/dL (ref 0.50–1.35)
Total Bilirubin: 1.5 mg/dL — ABNORMAL HIGH (ref 0.3–1.2)

## 2011-08-16 LAB — CULTURE, BLOOD (ROUTINE X 2): Culture: NO GROWTH

## 2011-08-16 MED ORDER — ENSURE CLINICAL ST REVIGOR PO LIQD: 237.0000 mL | ORAL | Status: DC | PRN

## 2011-08-16 NOTE — Progress Notes (Signed)
Physical Therapy Treatment Patient Details Name: Mitchell Hines MRN: [id number] DOB: Aug 30, 1941 Today's Date: 08/16/2011 Time: 9528-4132 PT Assessment/Plan  PT - Assessment/Plan Comments on Treatment Session: Pt agreeable to minimal activity this session. Denied dizziness with mobility. Able to initiate ambulation and encouraged pt to sit up as tolerated. Pt states plan is still for home with wife. Continue to recommend HHPT and pt will also need RW.  PT Plan: Discharge plan remains appropriate Follow Up Recommendations: Home health PT Equipment Recommended: Rolling walker with 5" wheels PT Goals  Acute Rehab PT Goals PT Goal: Supine/Side to Sit - Progress: Progressing toward goal PT Goal: Stand - Progress: Progressing toward goal PT Goal: Ambulate - Progress: Progressing toward goal  PT Treatment Precautions/Restrictions  Precautions Precautions: Fall Required Braces or Orthoses: No Restrictions Weight Bearing Restrictions: No Mobility (including Balance) Bed Mobility Bed Mobility: Yes Supine to Sit: 5: Supervision;With rails Supine to Sit Details (indicate cue type and reason): Min-guard assist. Increased time and effort with heavy reliance on rail to bring trunk to upright Transfers Transfers: Yes Sit to Stand Details (indicate cue type and reason): VC safety, technique, hand placement. Multiple attempts to rise without assist. Assist (Pt=85%) to rise. Stand to Sit Details: Min-guard assist. VCs safety, technique, hand placement Ambulation/Gait Ambulation/Gait: Yes Ambulation/Gait Assistance Details (indicate cue type and reason): Min-guard assist. Slow gait speed. Slightly unsteady . Pt state "I'm not tired, just winded". Declined further ambulation in hallway. Ambulation Distance (Feet): 12 Feet (x 2, with seated rest break in between.) Assistive device: Rolling walker Gait Pattern: Decreased step length - left;Decreased step length - right;Step-through pattern    Posture/Postural Control Posture/Postural Control: No significant limitations Balance Balance Assessed: No (Required assistive device for stability) Exercise    End of Session PT - End of Session Equipment Utilized During Treatment: Gait belt (RW) Activity Tolerance: Patient limited by fatigue Patient left: in chair;with call bell in reach General Behavior During Session: Flat affect Cognition: [facility] for tasks performed  Rebeca Alert [facility] 08/16/2011, 3:44 PM

## 2011-08-16 NOTE — Progress Notes (Signed)
Occupational Therapy Treatment Patient Details Name: Mitchell Hines MRN: [id number] DOB: [birth year] Today's Date: 08/16/2011 1TA 1520-1530 OT Assessment/Plan OT Assessment/Plan OT Plan: Discharge plan remains appropriate OT Frequency: Min 2X/week Follow Up Recommendations: Home health OT;24 hour supervision/assistance Equipment Recommended: 3 in 1 bedside comode OT Goals ADL Goals ADL Goal: Toilet Transfer - Progress: Progressing toward goals ADL Goal: Toileting - Clothing Manipulation - Progress: Progressing toward goals ADL Goal: Toileting - Hygiene - Progress: Progressing toward goals  OT Treatment Precautions/Restrictions  Precautions Precautions: Fall Required Braces or Orthoses: No Restrictions Weight Bearing Restrictions: No   ADL ADL Toilet Transfer: Performed;Other (comment) (minguard A w/RW) Toilet Transfer Method: Proofreader: Regular height toilet;Grab bars;Other (comment) (VCs to use grab bar) Toileting - Clothing Manipulation: Performed;Minimal assistance Toileting - Clothing Manipulation Details (indicate cue type and reason): Pt manipulated pull up to use urinal while standing Where Assessed - Toileting Clothing Manipulation: Standing Toileting - Hygiene: Performed;Minimal assistance Toileting - Hygiene Details (indicate cue type and reason): to position urinal while standing Where Assessed - Toileting Hygiene: Standing Mobility  Bed Mobility Bed Mobility: Yes Supine to Sit: 5: Supervision;With rails Supine to Sit Details (indicate cue type and reason): Min-guard assist. Increased time and effort with heavy reliance on rail to bring trunk to upright Transfers Sit to Stand: Other (comment);From bed;From toilet;With upper extremity assist (Minguard A w/ VCs for hand placement) Sit to Stand Details (indicate cue type and reason): VC safety, technique, hand placement. Multiple attempts to rise without assist. Assist (Pt=85%) to rise. Stand  to Sit: Other (comment);To chair/3-in-1;With armrests (minguard A w/RW) Stand to Sit Details: Min-guard assist. VCs safety, technique, hand placement Exercises    End of Session OT - End of Session Equipment Utilized During Treatment: Gait belt (RW) Activity Tolerance: Patient limited by fatigue Patient left: in chair;with call bell in reach General Behavior During Session: Flat affect Cognition: [facility] for tasks performed  Orma Cheetham A [phone]  08/16/2011, 3:47 PM

## 2011-08-16 NOTE — Progress Notes (Signed)
08/16/2011 Palliative Medicine Team SW 3:00 PM Follow up psychosocial visit with pt alone in room. Continue to build rapport with pt; today he is almost friendly and makes better eye contact. Pt states continued poor appetite but says he did make attempt to eat his lunch. Pt denies any physical discomfort and states his mood is "about the same". Pt states he is still waiting to have a staging CT to see if chemotherapy was productive. I doubt this gentleman will be able to continue GOC conversations until he feels he has all the information he needs. Pt thanked me for visit.   Spoke with wife separately in the hallway, she is visibly fatigued. Wife states that pt does not like her to leave him alone in hospital but she recognizes her need to practice self-care. Spoke with wife about the need to have the difficult discussions about advance directives/goals of care despite what results come in the next few days. Wife expressed her understanding and agreement but states, "I'm just not ready to think about it; We aren't ready to go there yet." Empathized with her grief and provided emotional support. Family was given Hard Choices booklet at original GOC mtg with Dr. Ladona Ridgel.   PMT will continue to encourage advance care planning and to provide emotional support to pt and family.   Kennieth Francois, [place] Pager [phone]

## 2011-08-16 NOTE — Progress Notes (Signed)
Patient ID: Mitchell Hines, male   DOB: [birth year], 70 y.o.   MRN: [id number] INFECTIOUS DISEASE PROGRESS NOTE   Date of Admission:  08/10/2011      . aspirin  81 mg Oral Daily  . baclofen  5 mg Oral TID  . dronabinol  10 mg Oral BID AC  . enoxaparin (LOVENOX) injection  120 mg Subcutaneous Q24H  . hydrocortisone   Rectal TID  . insulin aspart  0-9 Units Subcutaneous TID WC  . linagliptin  5 mg Oral Daily  . lipase/protease/amylase  2 capsule Oral TID AC  . pantoprazole  40 mg Oral Q1200  . predniSONE  5 mg Oral QAC breakfast  . senna-docusate  1 tablet Oral Daily  . sucralfate  1 g Oral QID  . terazosin  5 mg Oral QHS  . DISCONTD: feeding supplement  237 mL Oral TID WC  . DISCONTD: piperacillin-tazobactam (ZOSYN)  IV  3.375 g Intravenous Q8H    Subjective: He states that he is feeling well and denies any specific new complaints.  Objective: Temp (24hrs), Avg:98.7 F (37.1 C), Min:98.4 F (36.9 C), Max:99.1 F (37.3 C)    General: He is alert and in no distress, eating lunch with his wife. Skin: No rash Lung:  Cor: Clear  Abdomen:  Mild right upper quadrant discomfort with palpation. No apparent problems and with his biliary drain   Lab Results Lab Results  Component Value Date   WBC 29.5* 08/16/2011   HGB 8.8* 08/16/2011   HCT 25.8* 08/16/2011   MCV 82.7 08/16/2011   PLT 143* 08/16/2011    Lab Results  Component Value Date   CREATININE 1.01 08/16/2011   BUN 9 08/16/2011   NA 138 08/16/2011   K 3.3* 08/16/2011   CL 109 08/16/2011   CO2 23 08/16/2011    Lab Results  Component Value Date   ALT 16 08/16/2011   AST 22 08/16/2011   ALKPHOS 156* 08/16/2011   BILITOT 1.5* 08/16/2011     Microbiology: Recent Results (from the past 240 hour(s))  CULTURE, BLOOD (ROUTINE X 2)     Status: Normal   Collection Time   08/10/11  4:30 PM      Component Value Range Status Comment   Specimen Description BLOOD RIGHT ANTECUBITAL   Final    Special Requests     Final    Value: BOTTLES  DRAWN AEROBIC AND ANAEROBIC 6CC AEROBIC, 4 CC ANAEROBIC   Setup Time [id number]   Final    Culture NO GROWTH 5 DAYS   Final    Report Status 08/16/2011 FINAL   Final   CULTURE, BLOOD (ROUTINE X 2)     Status: Normal   Collection Time   08/10/11  4:35 PM      Component Value Range Status Comment   Specimen Description BLOOD RIGHT HAND   Final    Special Requests     Final    Value: BOTTLES DRAWN AEROBIC AND ANAEROBIC 6CC AEROBIC, 4CC ANAEROBIC   Setup Time [id number]   Final    Culture NO GROWTH 5 DAYS   Final    Report Status 08/16/2011 FINAL   Final   BODY FLUID CULTURE     Status: Normal   Collection Time   08/10/11  8:52 PM      Component Value Range Status Comment   Specimen Description COMMON BILE DUCT   Final    Special Requests NONE   Final    Gram Stain  Final    Value: NO WBC SEEN     ABUNDANT GRAM POSITIVE COCCI IN PAIRS AND CHAINS     FEW YEAST   Culture     Final    Value: MULTIPLE ORGANISMS PRESENT, NONE PREDOMINANT     Note: NO STAPHYLOCOCCUS AUREUS ISOLATED NO GROUP A STREP (S.PYOGENES) ISOLATED   Report Status 08/13/2011 FINAL   Final   URINE CULTURE     Status: Normal   Collection Time   08/11/11 11:40 PM      Component Value Range Status Comment   Specimen Description URINE, RANDOM   Final    Special Requests NONE   Final    Setup Time [id number]   Final    Colony Count NO GROWTH   Final    Culture NO GROWTH   Final    Report Status 08/13/2011 FINAL   Final     Studies/Results: No results found.   Assessment: I believe that his leukocytosis is probably due to recent Neulasta rather than any active infection.  Plan: 1.  Continue observation off of antibiotics  2.  I will sign off now. Please call if we can assist further.    R[facility] [phone] 08/16/2011, 2:29 PM

## 2011-08-16 NOTE — Progress Notes (Signed)
Nutrition Follow-up  Chart reviewed, pt continues with poor intake.  PO 0-100% of meals; variable.  Pt has been drinking some Ensure Clinical Strength, averaging BID. Noted pt affect and frustrations expressed to LCSWA which may be contributing to poor PO. Pt also reports some difficulty swallowing which is new to RD.  Pt states sometimes he feels as though food is getting stuck.  Liquids are tolerated better.  Oriented pt to menu for liquids with increased nutritional value.  Pt with excessive stool output. Pt feels this is improved today.  Has been getting Creon daily.  Diet Order:  CHO Mod Med  Meds: Scheduled Meds:   . aspirin  81 mg Oral Daily  . baclofen  5 mg Oral TID  . dronabinol  10 mg Oral BID AC  . enoxaparin (LOVENOX) injection  120 mg Subcutaneous Q24H  . feeding supplement  237 mL Oral TID WC  . hydrocortisone   Rectal TID  . insulin aspart  0-9 Units Subcutaneous TID WC  . linagliptin  5 mg Oral Daily  . lipase/protease/amylase  2 capsule Oral TID AC  . pantoprazole  40 mg Oral Q1200  . predniSONE  5 mg Oral QAC breakfast  . senna-docusate  1 tablet Oral Daily  . sucralfate  1 g Oral QID  . terazosin  5 mg Oral QHS  . DISCONTD: piperacillin-tazobactam (ZOSYN)  IV  3.375 g Intravenous Q8H   Continuous Infusions:   . DISCONTD: sodium chloride 100 mL/hr at 08/15/11 0700   PRN Meds:.acetaminophen, acetaminophen, bisacodyl, chlorproMAZINE, HYDROcodone-acetaminophen, HYDROmorphone, ondansetron (ZOFRAN) IV, ondansetron, prochlorperazine, promethazine, sodium chloride  Labs:  CMP     Component Value Date/Time   NA 138 08/16/2011 0335   K 3.3* 08/16/2011 0335   CL 109 08/16/2011 0335   CO2 23 08/16/2011 0335   GLUCOSE 79 08/16/2011 0335   BUN 9 08/16/2011 0335   CREATININE 1.01 08/16/2011 0335   CALCIUM 8.6 08/16/2011 0335   PROT 5.5* 08/16/2011 0335   ALBUMIN 2.1* 08/16/2011 0335   AST 22 08/16/2011 0335   ALT 16 08/16/2011 0335   ALKPHOS 156* 08/16/2011 0335   BILITOT  1.5* 08/16/2011 0335   GFRNONAA 73* 08/16/2011 0335   GFRAA 85* 08/16/2011 0335     Intake/Output Summary (Last 24 hours) at 08/16/11 1404 Last data filed at 08/16/11 1300  Gross per 24 hour  Intake    850 ml  Output   1330 ml  Net   -480 ml    Weight Status:   Admission wt: 155 lbs Current wt: 166 lbs  Nutrition Dx:  Inadequate oral intake, ongoing.  Intervention:   1.  Supplements;  Pt would prefer for Ensure order to be changed to prn as he does not particularly like them, but likes having the option of requesting if wanted for a snack.  Will order magic cups with trays. 2.  Nutrition-related medications;  Pt reports that if he gets hungry he will consume a snack.  Creon is order TID with meals.  MD- Consider adding prn regimen for snacks if needed. 3.  Other providers; pt reports some difficulty swallowing especially with solids.  Feels food is getting stuck vs. Aspirating. Consider consult to GI or SLP is indicated and appropriate per MD.  Monitoring:  1. Food/Beverage; pt to continue consuming >50% of meals. Limit refused meals. Not met, pt continues to refuse meals 2. GOC; nutrition to comply  Pt remain full code and is considering pursuing aggressive care for cancer dx,  continue.    Hoyt Koch Pager #:  [phone]

## 2011-08-16 NOTE — Progress Notes (Signed)
ANTICOAGULATION CONSULT NOTE - FOLLOW UP  Pharmacy Consult for Lovenox: Hx DVT  No Known Allergies  Patient Measurements: Height: 6' (182.9 cm) Weight: 166 lb 14.4 oz (75.705 kg) IBW/kg (Calculated) : 77.6    Vital Signs: Temp: 98.5 F (36.9 C) (12/07 0640) Temp src: Oral (12/07 0640) BP: 110/58 mmHg (12/07 0640) Pulse Rate: 47  (12/07 0640)  Labs:  Basename 08/16/11 0335 08/15/11 0550 08/14/11 0550  HGB 8.8* 9.0* --  HCT 25.8* 26.7* 22.8*  PLT 143* 129* 131*  APTT -- -- --  LABPROT -- -- --  INR -- -- --  HEPARINUNFRC -- -- --  CREATININE 1.01 1.14 1.30  CKTOTAL -- -- --  CKMB -- -- --  TROPONINI -- -- --   Estimated Creatinine Clearance: 72.9 ml/min (by C-G formula based on Cr of 1.01).  Medical History: Past Medical History  Diagnosis Date  . Pancreas cancer 2012  . Cholestasis 2012  . Liver metastases 2012  . Lung metastases 2012  . BPH (benign prostatic hyperplasia) 2012    with Dr. Alfredo Martinez  . Pulmonary embolism 2012  . Weight loss, non-intentional 2012  . Poor appetite 2012  . GERD (gastroesophageal reflux disease) 06/29/2011  . Pancreatic insufficiency 06/29/2011  . Hypertension 06/29/2011  . Hemorrhoid 06/29/2011  . Protein calorie malnutrition   . Neutropenia   . UTI (lower urinary tract infection)   . Diabetes mellitus     oral    Medications:  Scheduled:     . aspirin  81 mg Oral Daily  . baclofen  5 mg Oral TID  . dronabinol  10 mg Oral BID AC  . enoxaparin (LOVENOX) injection  120 mg Subcutaneous Q24H  . feeding supplement  237 mL Oral TID WC  . hydrocortisone   Rectal TID  . insulin aspart  0-9 Units Subcutaneous TID WC  . linagliptin  5 mg Oral Daily  . lipase/protease/amylase  2 capsule Oral TID AC  . pantoprazole  40 mg Oral Q1200  . predniSONE  5 mg Oral QAC breakfast  . senna-docusate  1 tablet Oral Daily  . sucralfate  1 g Oral QID  . terazosin  5 mg Oral QHS  . DISCONTD: piperacillin-tazobactam (ZOSYN)  IV   3.375 g Intravenous Q8H    Assessment:  70 yo M w/hx DVT on Lovenox 120mg  sq daily, receiving dose at [facility] as outpatient   Dose was reduced to 70mg  sq q24h on admission since CrCl was < 27ml/min   Dose increased back to 120mg  (1.5mg /kg) q24h on 12/4 since CrCl improved to > 30 ml/min  Of note, RN reports stool appeared to have 1-2 bloody clots on 12/4, none further reported   CBC remains low, but stable   Plan:   Continue current Lovenox regimen  Continue to follow renal function and s/sx bleeding  Rollene Fare 08/16/2011,9:14 AM Pager: [phone]

## 2011-08-16 NOTE — Progress Notes (Signed)
Subjective: No new issues,, ambulated with PT/OT.  Objective: Vital signs in last 24 hours: Temp:  [98.4 F (36.9 C)-99.1 F (37.3 C)] 99.1 F (37.3 C) (12/07 1328) Pulse Rate:  [47-52] 52  (12/07 1328) Resp:  [16] 16  (12/07 1328) BP: (106-127)/(56-64) 106/56 mmHg (12/07 1328) SpO2:  [98 %-100 %] 98 % (12/07 1328) Weight:  [75.705 kg (166 lb 14.4 oz)] 166 lb 14.4 oz (75.705 kg) (12/07 0640) Weight change: -1.225 kg (-2 lb 11.2 oz) Last BM Date: 08/15/11  Intake/Output from previous day: 12/06 0701 - 12/07 0700 In: 540 [P.O.:290; IV Piggyback:50] Out: 1005 [Urine:550; Drains:455] Total I/O In: 480 [P.O.:480] Out: 1150 [Urine:1000; Drains:150]   Physical Exam:  General: Comfortable, alert, communicative, fully oriented, not short of breath at rest.  HEENT:  Mild clinical pallor, no jaundice, no conjunctival injection or discharge. NECK:  Supple, JVP not seen, no carotid bruits, no palpable lymphadenopathy, no palpable goiter. CHEST:  Clinically clear to auscultation, no wheezes, no crackles. HEART:  Sounds 1 and 2 heard, normal, regular, no murmurs. ABDOMEN:  Full, soft, non-tender, no clinical ascites, normal bowel sounds. GENITALIA:  Not examined. LOWER EXTREMITIES:  No pitting edema, palpable peripheral pulses. MUSCULOSKELETAL SYSTEM:  Generalized osteoarthritic changes, otherwise, normal. CENTRAL NERVOUS SYSTEM:  No focal neurologic deficit on gross examination.  Lab Results:  [facility] 08/16/11 0335 08/15/11 0550  WBC 29.5* 29.8*  HGB 8.8* 9.0*  HCT 25.8* 26.7*  PLT 143* 129*    Basename 08/16/11 0335 08/15/11 0550  NA 138 139  K 3.3* 3.8  CL 109 111  CO2 23 23  GLUCOSE 79 101*  BUN 9 10  CREATININE 1.01 1.14  CALCIUM 8.6 8.6   Recent Results (from the past 240 hour(s))  CULTURE, BLOOD (ROUTINE X 2)     Status: Normal   Collection Time   08/10/11  4:30 PM      Component Value Range Status Comment   Specimen Description BLOOD RIGHT ANTECUBITAL   Final      Special Requests     Final    Value: BOTTLES DRAWN AEROBIC AND ANAEROBIC 6CC AEROBIC, 4 CC ANAEROBIC   Setup Time 201212012012   Final    Culture NO GROWTH 5 DAYS   Final    Report Status 08/16/2011 FINAL   Final   CULTURE, BLOOD (ROUTINE X 2)     Status: Normal   Collection Time   08/10/11  4:35 PM      Component Value Range Status Comment   Specimen Description BLOOD RIGHT HAND   Final    Special Requests     Final    Value: BOTTLES DRAWN AEROBIC AND ANAEROBIC 6CC AEROBIC, 4CC ANAEROBIC   Setup Time [id number]   Final    Culture NO GROWTH 5 DAYS   Final    Report Status 08/16/2011 FINAL   Final   BODY FLUID CULTURE     Status: Normal   Collection Time   08/10/11  8:52 PM      Component Value Range Status Comment   Specimen Description COMMON BILE DUCT   Final    Special Requests NONE   Final    Gram Stain     Final    Value: NO WBC SEEN     ABUNDANT GRAM POSITIVE COCCI IN PAIRS AND CHAINS     FEW YEAST   Culture     Final    Value: MULTIPLE ORGANISMS PRESENT, NONE PREDOMINANT     Note: NO STAPHYLOCOCCUS  AUREUS ISOLATED NO GROUP A STREP (S.PYOGENES) ISOLATED   Report Status 08/13/2011 FINAL   Final   URINE CULTURE     Status: Normal   Collection Time   08/11/11 11:40 PM      Component Value Range Status Comment   Specimen Description URINE, RANDOM   Final    Special Requests NONE   Final    Setup Time [id number]   Final    Colony Count NO GROWTH   Final    Culture NO GROWTH   Final    Report Status 08/13/2011 FINAL   Final      Studies/Results: No results found.  Medications: Scheduled Meds:    . aspirin  81 mg Oral Daily  . baclofen  5 mg Oral TID  . dronabinol  10 mg Oral BID AC  . enoxaparin (LOVENOX) injection  120 mg Subcutaneous Q24H  . hydrocortisone   Rectal TID  . insulin aspart  0-9 Units Subcutaneous TID WC  . linagliptin  5 mg Oral Daily  . lipase/protease/amylase  2 capsule Oral TID AC  . pantoprazole  40 mg Oral Q1200  . predniSONE  5 mg  Oral QAC breakfast  . senna-docusate  1 tablet Oral Daily  . sucralfate  1 g Oral QID  . terazosin  5 mg Oral QHS  . DISCONTD: feeding supplement  237 mL Oral TID WC   Continuous Infusions:   PRN Meds:.acetaminophen, acetaminophen, bisacodyl, chlorproMAZINE, feeding supplement, HYDROcodone-acetaminophen, HYDROmorphone, ondansetron (ZOFRAN) IV, ondansetron, prochlorperazine, promethazine, sodium chloride  Assessment/Plan:  Principal Problem:  *ARF (acute renal failure): Prerenal, resolved. Discontinued iv fluids on 08/15/11.  Active Problems:  1. Leukocytosis: Empiric Zosyn was discontinued on 08/15/11, after day#5, [per Dr Blair Dolphin recommendations. Biliary drain in-situ. The gram stain showed gram-positive cocci in pairs and chains/cultures grew multiple organisms, none predominant. As no clinical evidence of infection, this is likely a contaminant. Neulasta may be contributory (was administered on 08/08/11). I have discussed with Dr Truett Perna covering for Dr Gaylyn Rong, and he concurs that this may indeed, be the case. 2. Metastatic Pancreatic Ca: Manage per  Oncologist (Dr Gaylyn Rong). Seen by Palliative care on 08/13/11. Still full code. Dr Gaylyn Rong to decide on staging CT and order when appropriate. Continues on Marinol and low dose Prednisone.  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 Will discuss with IR, after weekend. May nneed to be internalized. 4.HTN: BP is normal, off Norvasc. Transient hypotension has resolved.  5.Diabetes mellitus, type II: Controlled. On SSI. 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 7.Hypokalemia: Repleted as appropriate. 8. Query dysphagia: Speech therapist has recommended regular diet.  9. Anemia. Hb now reasonable, status post transfusion of second unit PRBC, on 08/14/11.  Comment: Nearing discharge. If stable, will likely discharge on 08/19/11, or 08/20/11.  LOS: 6 days   Aseel Truxillo,[name] 08/16/2011, 6:22  PM

## 2011-08-17 ENCOUNTER — Ambulatory Visit: Payer: Medicare Other

## 2011-08-17 DIAGNOSIS — F329 Major depressive disorder, single episode, unspecified: Secondary | ICD-10-CM | POA: Diagnosis present

## 2011-08-17 LAB — BASIC METABOLIC PANEL
BUN: 10 mg/dL (ref 6–23)
CO2: 23 mEq/L (ref 19–32)
Calcium: 8.5 mg/dL (ref 8.4–10.5)
Creatinine, Ser: 0.92 mg/dL (ref 0.50–1.35)
Glucose, Bld: 74 mg/dL (ref 70–99)

## 2011-08-17 LAB — CBC
HCT: 25.9 % — ABNORMAL LOW (ref 39.0–52.0)
Hemoglobin: 8.7 g/dL — ABNORMAL LOW (ref 13.0–17.0)
MCH: 28.1 pg (ref 26.0–34.0)
MCV: 83.5 fL (ref 78.0–100.0)
RBC: 3.1 MIL/uL — ABNORMAL LOW (ref 4.22–5.81)

## 2011-08-17 LAB — GLUCOSE, CAPILLARY: Glucose-Capillary: 82 mg/dL (ref 70–99)

## 2011-08-17 MED ORDER — MICONAZOLE NITRATE 2 % EX CREA
TOPICAL_CREAM | Freq: Two times a day (BID) | CUTANEOUS | Status: DC
  Administered 2011-08-17 – 2011-08-18 (×4): via TOPICAL
  Filled 2011-08-17: qty 14

## 2011-08-17 MED ORDER — POTASSIUM CHLORIDE CRYS ER 20 MEQ PO TBCR
40.0000 meq | EXTENDED_RELEASE_TABLET | Freq: Two times a day (BID) | ORAL | Status: DC
  Administered 2011-08-17 – 2011-08-19 (×4): 40 meq via ORAL
  Filled 2011-08-17 (×7): qty 2

## 2011-08-17 MED ORDER — DULOXETINE HCL 30 MG PO CPEP
30.0000 mg | ORAL_CAPSULE | Freq: Every day | ORAL | Status: DC
  Administered 2011-08-17 – 2011-08-19 (×3): 30 mg via ORAL
  Filled 2011-08-17 (×4): qty 1

## 2011-08-17 MED ORDER — METHYLPHENIDATE HCL 5 MG PO TABS
5.0000 mg | ORAL_TABLET | Freq: Two times a day (BID) | ORAL | Status: DC
  Administered 2011-08-17 – 2011-08-19 (×5): 5 mg via ORAL
  Filled 2011-08-17 (×5): qty 1

## 2011-08-17 NOTE — Progress Notes (Signed)
Patient Active Problem List  Diagnoses  . Pancreas cancer  . Liver metastases  . Lung metastases  . BPH (benign prostatic hyperplasia)  . Pulmonary embolism  . Weight loss, non-intentional  . Poor appetite  . GERD (gastroesophageal reflux disease)  . Pancreatic insufficiency  . Hypertension  . Hemorrhoid  . Dehydration  . Neutropenia  . Protein calorie malnutrition  . DM type 2 (diabetes mellitus, type 2)  . ARF (acute renal failure)  . Pancytopenia  . Sepsis  . Neutropenia with fever  . Generalized weakness  . Leukocytosis  . Failure to thrive  . Depression    Met with patient this morning to asses his needs from a palliative standpoint. He is severely depressed, mostly from his medical condition, immobility, loss of independence. He denies pain but states "I am just weak". His weakness has rendered him completely bedbound, now incontinent of stool wearing a brief because he cannot get OOB. His weakness  Is likely multifactorial related to malnutrition, depression, chemothrapy and underlying progression of cancer.  Plan: Will do a trial of Ritalin/stimulant to see if we can get his energy back and also start low dose Cymbalta which will take longer to work but may be helpful. Will continue to provide SW support. He remains a full code, waiting on staging CT before making any addition advance planning decisions. Patient is agreeable with plan, wife not at bedside.   15 min visit. Greater than 50%  of this time was spent counseling and coordinating care related to the above assessment and plan.

## 2011-08-17 NOTE — Progress Notes (Signed)
Subjective: No new issues. Seen by palliative care team today. Input appreciated.  Objective: Vital signs in last 24 hours: Temp:  [98.7 F (37.1 C)-99.3 F (37.4 C)] 99.1 F (37.3 C) (12/08 1417) Pulse Rate:  [47-56] 52  (12/08 1417) Resp:  [16-20] 16  (12/08 1417) BP: (116-121)/(51-61) 118/54 mmHg (12/08 1417) SpO2:  [98 %-100 %] 99 % (12/08 1417) Weight:  [75.433 kg (166 lb 4.8 oz)] 166 lb 4.8 oz (75.433 kg) (12/08 0500) Weight change: -0.272 kg (-9.6 oz) Last BM Date: 08/16/11  Intake/Output from previous day: 12/07 0701 - 12/08 0700 In: 720 [P.O.:720] Out: 2095 [Urine:1770; Drains:325] Total I/O In: 120 [P.O.:120] Out: 500 [Urine:400; Drains:100]   Physical Exam: General: Comfortable, alert, communicative, fully oriented, not short of breath at rest.  HEENT:  Mild clinical pallor, no jaundice, no conjunctival injection or discharge. NECK:  Supple, JVP not seen, no carotid bruits, no palpable lymphadenopathy, no palpable goiter. CHEST:  Clinically clear to auscultation, no wheezes, no crackles. HEART:  Sounds 1 and 2 heard, normal, regular, no murmurs. ABDOMEN:  Full, soft, non-tender, no clinical ascites, normal bowel sounds. GENITALIA:  Not examined. LOWER EXTREMITIES:  No pitting edema, palpable peripheral pulses. MUSCULOSKELETAL SYSTEM:  Generalized osteoarthritic changes, otherwise, normal. CENTRAL NERVOUS SYSTEM:  No focal neurologic deficit on gross examination.  Lab Results:  Basename 08/17/11 0600 08/16/11 0335  WBC 23.2* 29.5*  HGB 8.7* 8.8*  HCT 25.9* 25.8*  PLT 155 143*    Basename 08/17/11 0600 08/16/11 0335  NA 136 138  K 3.3* 3.3*  CL 105 109  CO2 23 23  GLUCOSE 74 79  BUN 10 9  CREATININE 0.92 1.01  CALCIUM 8.5 8.6   Recent Results (from the past 240 hour(s))  CULTURE, BLOOD (ROUTINE X 2)     Status: Normal   Collection Time   08/10/11  4:30 PM      Component Value Range Status Comment   Specimen Description BLOOD RIGHT ANTECUBITAL    Final    Special Requests     Final    Value: BOTTLES DRAWN AEROBIC AND ANAEROBIC 6CC AEROBIC, 4 CC ANAEROBIC   Setup Time 201212012012   Final    Culture NO GROWTH 5 DAYS   Final    Report Status 08/16/2011 FINAL   Final   CULTURE, BLOOD (ROUTINE X 2)     Status: Normal   Collection Time   08/10/11  4:35 PM      Component Value Range Status Comment   Specimen Description BLOOD RIGHT HAND   Final    Special Requests     Final    Value: BOTTLES DRAWN AEROBIC AND ANAEROBIC 6CC AEROBIC, 4CC ANAEROBIC   Setup Time [id number]   Final    Culture NO GROWTH 5 DAYS   Final    Report Status 08/16/2011 FINAL   Final   BODY FLUID CULTURE     Status: Normal   Collection Time   08/10/11  8:52 PM      Component Value Range Status Comment   Specimen Description COMMON BILE DUCT   Final    Special Requests NONE   Final    Gram Stain     Final    Value: NO WBC SEEN     ABUNDANT GRAM POSITIVE COCCI IN PAIRS AND CHAINS     FEW YEAST   Culture     Final    Value: MULTIPLE ORGANISMS PRESENT, NONE PREDOMINANT     Note: NO STAPHYLOCOCCUS AUREUS  ISOLATED NO GROUP A STREP (S.PYOGENES) ISOLATED   Report Status 08/13/2011 FINAL   Final   URINE CULTURE     Status: Normal   Collection Time   08/11/11 11:40 PM      Component Value Range Status Comment   Specimen Description URINE, RANDOM   Final    Special Requests NONE   Final    Setup Time [id number]   Final    Colony Count NO GROWTH   Final    Culture NO GROWTH   Final    Report Status 08/13/2011 FINAL   Final      Studies/Results: No results found.  Medications: Scheduled Meds:    . aspirin  81 mg Oral Daily  . baclofen  5 mg Oral TID  . dronabinol  10 mg Oral BID AC  . DULoxetine  30 mg Oral Daily  . enoxaparin (LOVENOX) injection  120 mg Subcutaneous Q24H  . hydrocortisone   Rectal TID  . insulin aspart  0-9 Units Subcutaneous TID WC  . linagliptin  5 mg Oral Daily  . lipase/protease/amylase  2 capsule Oral TID AC  . methylphenidate   5 mg Oral BID WC  . miconazole   Topical BID  . pantoprazole  40 mg Oral Q1200  . predniSONE  5 mg Oral QAC breakfast  . senna-docusate  1 tablet Oral Daily  . sucralfate  1 g Oral QID  . terazosin  5 mg Oral QHS   Continuous Infusions:   PRN Meds:.acetaminophen, acetaminophen, bisacodyl, chlorproMAZINE, feeding supplement, HYDROcodone-acetaminophen, HYDROmorphone, ondansetron (ZOFRAN) IV, ondansetron, prochlorperazine, promethazine, sodium chloride  Assessment/Plan:  Principal Problem:  *ARF (acute renal failure): Prerenal, resolved. Discontinued iv fluids on 08/15/11.  Active Problems:  1. Leukocytosis: Empiric Zosyn was discontinued on 08/15/11, after day#5, per Dr Blair Dolphin recommendations.  Biliary drain in-situ. The gram stain showed gram-positive cocci in pairs and chains/cultures grew multiple organisms, none predominant. As no clinical evidence of infection, this is likely a contaminant. Neulasta may be contributory (was administered on 08/08/11). I have discussed with Dr Truett Perna covering for Dr Gaylyn Rong, and he concurs that this may indeed, be the case. Still no pyrexia, or deterioration in status. Wcc is finally trending down. 2. Metastatic Pancreatic Ca: Manage per Oncologist (Dr Gaylyn Rong). Seen by Palliative care on 08/13/11. Still full code. Dr Gaylyn Rong to decide on staging CT and order when appropriate, likely, this will be done after discharge. Continues on Marinol and low dose Prednisone.  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 Will discuss with IR, after weekend. May need to be internalized. 4.HTN: BP is normal, off Norvasc. Transient hypotension has resolved.  5.Diabetes mellitus, type II: Controlled. On SSI. 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 7.Hypokalemia: Repleted as appropriate. 8. Query dysphagia: Speech therapist has recommended regular diet.  9. Anemia. Hb now reasonable, status post transfusion of second  unit PRBC, on 08/14/11.  Comment: Nearing discharge. If stable, will likely discharge on 08/19/11, or 08/20/11.  LOS: 7 days   Najeeb Uptain,[name] 08/17/2011, 3:47 PM

## 2011-08-18 LAB — GLUCOSE, CAPILLARY
Glucose-Capillary: 97 mg/dL (ref 70–99)
Glucose-Capillary: 99 mg/dL (ref 70–99)

## 2011-08-18 LAB — BASIC METABOLIC PANEL
CO2: 24 mEq/L (ref 19–32)
Calcium: 8.4 mg/dL (ref 8.4–10.5)
Chloride: 104 mEq/L (ref 96–112)
Creatinine, Ser: 0.85 mg/dL (ref 0.50–1.35)
Glucose, Bld: 74 mg/dL (ref 70–99)
Sodium: 136 mEq/L (ref 135–145)

## 2011-08-18 LAB — CBC
Hemoglobin: 8.8 g/dL — ABNORMAL LOW (ref 13.0–17.0)
MCH: 28.5 pg (ref 26.0–34.0)
MCV: 83.5 fL (ref 78.0–100.0)
RBC: 3.09 MIL/uL — ABNORMAL LOW (ref 4.22–5.81)

## 2011-08-18 NOTE — Progress Notes (Signed)
Subjective: No new issues.  Objective: Vital signs in last 24 hours: Temp:  [98.3 F (36.8 C)-99.1 F (37.3 C)] 98.3 F (36.8 C) (12/09 0535) Pulse Rate:  [48-57] 48  (12/09 0535) Resp:  [16-20] 20  (12/09 0535) BP: (115-132)/(54-69) 115/61 mmHg (12/09 0535) SpO2:  [99 %-100 %] 100 % (12/09 0535) Weight change:  Last BM Date: 08/16/11  Intake/Output from previous day: 12/08 0701 - 12/09 0700 In: 320 [P.O.:320] Out: 1475 [Urine:1325; Drains:150] Total I/O In: -  Out: 125 [Drains:125]   Physical Exam: General: Comfortable, alert, communicative, fully oriented, not short of breath at rest. Improved oral intake. HEENT:  Mild-moderate clinical pallor, no jaundice, no conjunctival injection or discharge. NECK:  Supple, JVP not seen, no carotid bruits, no palpable lymphadenopathy, no palpable goiter. CHEST:  Clinically clear to auscultation, no wheezes, no crackles. HEART:  Sounds 1 and 2 heard, normal, regular, no murmurs. ABDOMEN:  Full, soft, non-tender, no clinical ascites, normal bowel sounds. GENITALIA:  Not examined. LOWER EXTREMITIES:  No pitting edema, palpable peripheral pulses. MUSCULOSKELETAL SYSTEM:  Generalized osteoarthritic changes, otherwise, normal. CENTRAL NERVOUS SYSTEM:  No focal neurologic deficit on gross examination.  Lab Results:  Basename 08/18/11 0422 08/17/11 0600  WBC 24.0* 23.2*  HGB 8.8* 8.7*  HCT 25.8* 25.9*  PLT 161 155    Basename 08/18/11 0422 08/17/11 0600  NA 136 136  K 3.4* 3.3*  CL 104 105  CO2 24 23  GLUCOSE 74 74  BUN 10 10  CREATININE 0.85 0.92  CALCIUM 8.4 8.5   Recent Results (from the past 240 hour(s))  CULTURE, BLOOD (ROUTINE X 2)     Status: Normal   Collection Time   08/10/11  4:30 PM      Component Value Range Status Comment   Specimen Description BLOOD RIGHT ANTECUBITAL   Final    Special Requests     Final    Value: BOTTLES DRAWN AEROBIC AND ANAEROBIC 6CC AEROBIC, 4 CC ANAEROBIC   Setup Time [id number]    Final    Culture NO GROWTH 5 DAYS   Final    Report Status 08/16/2011 FINAL   Final   CULTURE, BLOOD (ROUTINE X 2)     Status: Normal   Collection Time   08/10/11  4:35 PM      Component Value Range Status Comment   Specimen Description BLOOD RIGHT HAND   Final    Special Requests     Final    Value: BOTTLES DRAWN AEROBIC AND ANAEROBIC 6CC AEROBIC, 4CC ANAEROBIC   Setup Time [id number]   Final    Culture NO GROWTH 5 DAYS   Final    Report Status 08/16/2011 FINAL   Final   BODY FLUID CULTURE     Status: Normal   Collection Time   08/10/11  8:52 PM      Component Value Range Status Comment   Specimen Description COMMON BILE DUCT   Final    Special Requests NONE   Final    Gram Stain     Final    Value: NO WBC SEEN     ABUNDANT GRAM POSITIVE COCCI IN PAIRS AND CHAINS     FEW YEAST   Culture     Final    Value: MULTIPLE ORGANISMS PRESENT, NONE PREDOMINANT     Note: NO STAPHYLOCOCCUS AUREUS ISOLATED NO GROUP A STREP (S.PYOGENES) ISOLATED   Report Status 08/13/2011 FINAL   Final   URINE CULTURE     Status: Normal  Collection Time   08/11/11 11:40 PM      Component Value Range Status Comment   Specimen Description URINE, RANDOM   Final    Special Requests NONE   Final    Setup Time [id number]   Final    Colony Count NO GROWTH   Final    Culture NO GROWTH   Final    Report Status 08/13/2011 FINAL   Final      Studies/Results: No results found.  Medications: Scheduled Meds:    . aspirin  81 mg Oral Daily  . baclofen  5 mg Oral TID  . dronabinol  10 mg Oral BID AC  . DULoxetine  30 mg Oral Daily  . enoxaparin (LOVENOX) injection  120 mg Subcutaneous Q24H  . hydrocortisone   Rectal TID  . insulin aspart  0-9 Units Subcutaneous TID WC  . linagliptin  5 mg Oral Daily  . lipase/protease/amylase  2 capsule Oral TID AC  . methylphenidate  5 mg Oral BID WC  . miconazole   Topical BID  . pantoprazole  40 mg Oral Q1200  . potassium chloride  40 mEq Oral BID  . predniSONE  5  mg Oral QAC breakfast  . senna-docusate  1 tablet Oral Daily  . sucralfate  1 g Oral QID  . terazosin  5 mg Oral QHS   Continuous Infusions:   PRN Meds:.acetaminophen, acetaminophen, bisacodyl, chlorproMAZINE, feeding supplement, HYDROcodone-acetaminophen, HYDROmorphone, ondansetron (ZOFRAN) IV, ondansetron, prochlorperazine, promethazine, sodium chloride  Assessment/Plan:  Principal Problem:  *ARF (acute renal failure): Prerenal, resolved. Discontinued iv fluids on 08/15/11.  Active Problems:  1. Leukocytosis: Empiric Zosyn was discontinued on 08/15/11, after day#5, per Dr Blair Dolphin recommendations.  Biliary drain in-situ. The gram stain showed gram-positive cocci in pairs and chains/cultures grew multiple organisms, none predominant. As no clinical evidence of infection, this is likely a contaminant. Neulasta may be contributory (was administered on 08/08/11). I have discussed with Dr Truett Perna covering for Dr Gaylyn Rong, and he concurs that this may indeed, be the case. Still no pyrexia, or deterioration in status. Wcc is finally trending down/stable. 2. Metastatic Pancreatic Ca: Manage per Oncologist (Dr Gaylyn Rong). Seen by Palliative care on 08/13/11 and 08/17/11. Still full code. Dr Gaylyn Rong to decide on staging CT and order when appropriate. Likely, this will be done after discharge. Continues on Marinol and low dose Prednisone.  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 Will discuss with IR, on 08/19/11.. May need to be internalized. 4.HTN: BP is normal, off Norvasc. Transient hypotension has resolved. Sinus bradycardia is asymptomatic. 5.Diabetes mellitus, type II: Controlled. On SSI. 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 7.Hypokalemia: Repleted as appropriate. 8. Query dysphagia: Speech therapist has recommended regular diet.  9. Anemia. Hb now reasonable, status post transfusion of second unit PRBC, on 08/14/11.  Comment: Nearing  discharge. If stable, will likely discharge on 08/19/11, or 08/20/11. Will need to touch base with Oncologist and IR, prior to DC.  LOS: 8 days   Mitchell Hines,Mitchell Hines 08/18/2011, 11:26 AM

## 2011-08-19 ENCOUNTER — Ambulatory Visit: Payer: Medicare Other

## 2011-08-19 LAB — CBC
HCT: 26.1 % — ABNORMAL LOW (ref 39.0–52.0)
Hemoglobin: 8.8 g/dL — ABNORMAL LOW (ref 13.0–17.0)
MCH: 28.1 pg (ref 26.0–34.0)
MCHC: 33.7 g/dL (ref 30.0–36.0)
MCV: 83.4 fL (ref 78.0–100.0)
RDW: 22.1 % — ABNORMAL HIGH (ref 11.5–15.5)

## 2011-08-19 LAB — COMPREHENSIVE METABOLIC PANEL
ALT: 12 U/L (ref 0–53)
AST: 21 U/L (ref 0–37)
Alkaline Phosphatase: 141 U/L — ABNORMAL HIGH (ref 39–117)
GFR calc Af Amer: >90 mL/min (ref >90)
Glucose, Bld: 66 mg/dL — ABNORMAL LOW (ref 70–99)
Potassium: 3.7 mEq/L (ref 3.5–5.1)
Sodium: 134 mEq/L — ABNORMAL LOW (ref 135–145)
Total Protein: 5.7 g/dL — ABNORMAL LOW (ref 6.0–8.3)

## 2011-08-19 MED ORDER — ENSURE CLINICAL ST REVIGOR PO LIQD: 237.0000 mL | Freq: Three times a day (TID) | ORAL | Status: DC

## 2011-08-19 MED ORDER — DULOXETINE HCL 30 MG PO CPEP: 30.0000 mg | ORAL_CAPSULE | Freq: Every day | ORAL | Status: DC

## 2011-08-19 MED ORDER — PREDNISONE 5 MG PO TABS: 5.0000 mg | ORAL_TABLET | Freq: Every day | ORAL | Status: AC

## 2011-08-19 MED ORDER — METHYLPHENIDATE HCL 5 MG PO TABS: 5.0000 mg | ORAL_TABLET | Freq: Two times a day (BID) | ORAL | Status: AC

## 2011-08-19 MED ORDER — MICONAZOLE NITRATE 2 % EX CREA: TOPICAL_CREAM | Freq: Two times a day (BID) | CUTANEOUS | Status: DC

## 2011-08-19 MED ORDER — HYDROCORTISONE 2.5 % RE CREA: TOPICAL_CREAM | Freq: Three times a day (TID) | RECTAL | Status: AC

## 2011-08-19 MED ORDER — HEPARIN SOD (PORK) LOCK FLUSH 100 UNIT/ML IV SOLN
500.0000 [IU] | INTRAVENOUS | Status: AC | PRN
  Administered 2011-08-19: 500 [IU]

## 2011-08-19 NOTE — Plan of Care (Signed)
Problem: Phase I Progression Outcomes Goal: OOB as tolerated unless otherwise ordered Outcome: Progressing Continue to encourage patient to get out of bed. Poor initiation

## 2011-08-19 NOTE — Progress Notes (Signed)
CARE MANAGEMENT NOTE 08/19/2011  Patient:  Mitchell Hines, Mitchell Hines   Account Number:  [id number]  Date Initiated:  08/14/2011  Documentation initiated by:  PEARSON,COOKIE  Subjective/Objective Assessment:   Pt admitted with cco dysohagia, n, v with mets     Action/Plan:   from home   Anticipated DC Date:  08/19/2011   Anticipated DC Plan:  HOME W HOME HEALTH SERVICES  In-house referral  NA      DC Planning Services  CM consult      Choice offered to / List presented to:             [facility] agency  [facility]   Status of service:  Completed, signed off Medicare Important Message given?  NO (If response is "NO", the following Medicare IM given date fields will be blank) Date Medicare IM given:   Date Additional Medicare IM given:    Discharge Disposition:  HOME W HOME HEALTH SERVICES  Per UR Regulation:  Reviewed for med. necessity/level of care/duration of stay  Comments:  [id number]/Rhonda Davis,RN,BSN,CCM:  patient discharged to return to home [facility]hc with rn and pt arranged through [facility].    08/14/11 MPearson, RN, BSN UR chart reviewed.

## 2011-08-19 NOTE — Discharge Summary (Signed)
Physician Discharge Summary  Patient ID: Mitchell Hines MRN: [id number] DOB/AGE: 70/31/1942 70 y.o.  Admit date: 08/10/2011 Discharge date: 08/19/2011  Primary Care Physician:  Dartha Lodge, MD Primary Oncologist: Dr Jethro Bolus.  Discharge Diagnoses:    Patient Active Problem List  Diagnoses  . Pancreas cancer  . Liver metastases  . Lung metastases  . BPH (benign prostatic hyperplasia)  . Pulmonary embolism  . Weight loss, non-intentional  . Poor appetite  . GERD (gastroesophageal reflux disease)  . Pancreatic insufficiency  . Hypertension  . Hemorrhoid  . Dehydration  . Neutropenia  . Protein calorie malnutrition  . DM type 2 (diabetes mellitus, type 2)  . ARF (acute renal failure)  . Pancytopenia  . Sepsis  . Neutropenia with fever  . Generalized weakness  . Leukocytosis  . Failure to thrive  . Depression    Current Discharge Medication List    CONTINUE these medications which have NOT CHANGED   Details  amLODipine (NORVASC) 10 MG tablet Take 5 mg by mouth daily.     aspirin 81 MG chewable tablet Chew 81 mg by mouth daily.      baclofen (LIORESAL) 10 MG tablet Take 5 mg by mouth 3 (three) times daily. For hiccups    dronabinol (MARINOL) 2.5 MG capsule Take 2.5 mg by mouth 2 (two) times daily before a meal.      HYDROcodone-acetaminophen (LORTAB) 7.5-500 MG/15ML solution Take 15 mLs by mouth every 6 (six) hours as needed. For pain    lipase/protease/amylase (CREON-10/PANCREASE) 12000 UNITS CPEP Take 2 capsules by mouth 3 (three) times daily before meals. Take 1 capsule before meals and if patient plans to eat a fatty food he will take 2 more capsules    metoCLOPramide (REGLAN) 10 MG tablet Take 1 tablet (10 mg total) by mouth 3 (three) times daily as needed (nausea/vomting; gagging or GI dysmotility. ). Qty: 30 tablet, Refills: 0    NON FORMULARY Chemotherapy is done here at the cancer center twice a month on Tuesday. Patient is scheduled to have treatment on this  Tuesday coming up. Patient is under the care of Dr. Jethro Bolus.    omeprazole (PRILOSEC) 40 MG capsule Take 40 mg by mouth daily.      ondansetron (ZOFRAN) 8 MG tablet Take 8 mg by mouth every 12 (twelve) hours as needed.      ondansetron (ZOFRAN-ODT) 8 MG disintegrating tablet Take 8 mg by mouth every 8 (eight) hours as needed. For nausea     potassium chloride 20 MEQ/15ML (10%) solution Take 30 mLs (40 mEq total) by mouth 2 (two) times daily. Qty: 500 mL, Refills: 3   Associated Diagnoses: Hypokalemia    prochlorperazine (COMPAZINE) 10 MG tablet Take 10 mg by mouth every 6 (six) hours as needed. For nausea    sitaGLIPtin (JANUVIA) 100 MG tablet Take 100 mg by mouth daily.      sucralfate (CARAFATE) 1 G tablet Take 1 g by mouth 4 (four) times daily.      terazosin (HYTRIN) 5 MG capsule Take 5 mg by mouth at bedtime.           Disposition and Follow-up:  Follow up with Primary MD, Primary Oncologist, and with Home Hospice. Consults:  ID and hematology/oncology, Palliative Medicine. Dr Jethro Bolus, Oncologist. Dr. Derenda Mis, Palliative Care Medicine. Dr Cliffton Asters, Infectious Diseases.  Significant Diagnostic Studies:  Dg Chest 2 View  08/10/2011  *RADIOLOGY REPORT*  Clinical Data: Cough and weakness; on chemotherapy for cancer  CHEST - 2 VIEW  Comparison: July 22, 2011  Findings: Mild cardiomegaly is unchanged.  The mediastinum and pulmonary vasculature are within normal limits.  There is a right subclavian Powerport with the tip at the cavoatrial junction.  Both lungs are clear.  There is tubing coiled over the abdomen on the lateral view.  IMPRESSION: Stable chest x-ray with no evidence of acute cardiac or pulmonary process.  Original Report Authenticated By: Brandon Melnick, M.D.    Brief H and P: For complete details, refer to admission H and P. However,  in brief, this is a 70 y.o. male with known history of stage IV pancreatic cancer, diabetes mellitus type 2 and  previous history of neutropenia secondary to chemotherapy, undergoing chemotherapy, presenting with progressive generalized weakness and poor oral intake. On initial evaluation in the emergency department, he was found to have acute renal failure with a creatinine of 2.3, against a baseline creatinine of 1.1. Patient also was found to have leukocytosis with WBCs count of 20.6 K. and absolute neutrophil counts of 17.2. He was admitted for further evaluation, investigation and management.  Physical Exam: On 08/19/2011. General: Comfortable, alert, communicative, fully oriented, not short of breath at rest. Much improved oral intake.  HEENT: Mild-moderate clinical pallor, no jaundice, no conjunctival injection or discharge.  NECK: Supple, JVP not seen, no carotid bruits, no palpable lymphadenopathy, no palpable goiter.  CHEST: Clinically clear to auscultation, no wheezes, no crackles.  HEART: Sounds 1 and 2 heard, normal, regular, no murmurs.  ABDOMEN: Full, soft, non-tender, no clinical ascites, normal bowel sounds.  GENITALIA: Not examined.  LOWER EXTREMITIES: No pitting edema, palpable peripheral pulses.  MUSCULOSKELETAL SYSTEM: Generalized osteoarthritic changes, otherwise, normal.  CENTRAL NERVOUS SYSTEM: No focal neurologic deficit on gross examination.   Hospital Course:  Principal Problem:  *ARF (acute renal failure): Patient presented as described above, and was managed with intravenous fluid hydration, with resolution of renal failure. As of 08/19/11, his creatinine was 0.8.   Active Problems:  1. Leukocytosis: This was noted on admission, and patient was initially commenced empiric Zosyn, whole septic workup was carried out. No features of infection were elicited, and gram stain of bile, obtained from biliary drain, showed gram-positive cocci in pairs and chains/cultures grew multiple organisms, none predominant. It was felt that this is likely a contaminant.  Dr Orvan Falconer, infectious  diseases specialist was consulted, and he recommended observation off antibiotics. Zosyn was discontinued on 08/15/11, after day#5. Neulasta may be contributory (was administered on 08/08/11). I have discussed with Dr Truett Perna covering for Dr Gaylyn Rong, and he concurs that this may indeed, be the case. No pyrexia, or deterioration in status was noted, off antibiotics. Wcc is stable.  2. Metastatic Pancreatic Cancer: Per Dr Ha,patient is s/p 3 cycles of chemotherapy, and his dose was decreased after the 2nd cycle. Even with dose reduction, he still has severe fatigue. Dr Gaylyn Rong has opined, that regardless of the response to chemo on upcoming restaging CT scheduled for 08/23/11, chemotherapy at this time is likely to be associated with more harm than benefit. If in the future, his performance status significantly improves, the need to resume chemotherapy may be re-visited.  Patient was seen by Palliative care on 08/13/11 and 08/17/11. He still wishes to be full code.  3.Cholestasis: This is secondary to pancreatic Ca, and patient is  s/p percutaneous biliary drain. Per Dr Gaylyn Rong, internalization would not be appropriate at this time.  4.HTN: Patient did have transient hypotension during this hospitalization, which  resolved with discontinuation of Norvasc, and administration of iv fluids.  5.Diabetes mellitus, type II: This remained controlled on SSI, during the hospitalization.  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  7.Hypokalemia: This was repleted as appropriate.  8.Query dysphagia: Patient was evaluated for this possibility, by speech therapist has been recommended regular diet.  9. Anemia. Patient required transfusion of 2 units PRBC, during this hospitalization. Second unit PRBC, was given on 08/14/11. Hemoglobin was 8.8 on 08/19/11.  Comment: Patient was considered clinically stable for discharge on 08/19/11. Home hospice has been arranged.   Time spent on Discharge: 45  mins.  Signed: Jaskarn Schweer,[name] 08/19/2011, 3:26 PM

## 2011-08-19 NOTE — Progress Notes (Signed)
1600 NURSING: Pt discharged home with biliary drain intact, Port a cath de accessed by IV team. Discharge instructions reviewed with pt, understanding verbalized.

## 2011-08-19 NOTE — Progress Notes (Signed)
08/19/2011 Palliative Medicine Team SW 11:46 AM Discussed pt in PMT Rounds. Also spoke with Cancer Ctr CSW Kathrin Penner, just as a heads up that pt is still full code and has not delineated goals of care despite advanced disease process. Family may need continued psychosocial support from oncology/cancer ctr after d/c. Family will also need extensive education about pt's care needs when discussing d/c. Available for emotional support as needed.   Kennieth Francois, [place] Pager [phone]

## 2011-08-19 NOTE — Consult Note (Signed)
[facility] INPATIENT PROGRESS NOTE  Name: Mitchell Hines      MRN: [id number]    Location: 1415/1415-01  Date: 08/19/2011 Time:1:08 PM   Subjective: Interval History:Mitchell Hines reported still feeling weak.  He was able to ambulate with a walker with a physical therapist for no more than 5 minutes.  He still has very poor appetite and just some clear liquid.  He still has dry throat and gagging sensation when he tries to swallow.  He denies depression, head ache, nausea/vomiting, abdominal pain, bleeding symptoms.   Objective: Vital signs in last 24 hours: Temp:  [97.6 F (36.4 C)-98.5 F (36.9 C)] 98.5 F (36.9 C) (12/10 0602) Pulse Rate:  [49-51] 51  (12/10 0602) Resp:  [18] 18  (12/10 0602) BP: (113-126)/(60-64) 113/60 mmHg (12/10 0602) SpO2:  [99 %-100 %] 100 % (12/10 0602) Weight:  [163 lb 8 oz (74.163 kg)] 163 lb 8 oz (74.163 kg) (12/10 0602)    Intake/Output from previous day: 12/09 0701 - 12/10 0700 In: 150 [P.O.:150] Out: 1401 [Urine:850; Drains:550]    Intake/Output this shift: Total I/O In: 120 [P.O.:120] Out: 375 [Urine:375]   PHYSICAL EXAM:  General: Thin, cachetically appearing male in no acute distress. Eyes: No icterus. ENT: There were no oropharyngeal lesions. Neck was without thyromegaly. Lymphatics: Negative cervical, supraclavicular or axillary adenopathy. Respiratory: lungs were clear bilaterally without wheezing or crackles. Cardiovascular: Regular rate and rhythm, S1/S2, without murmur, rub or gallop. There was no pedal edema. GI: abdomen was soft, flat, nontender, nondistended, without organomegaly. His percutaneous biliary drainage tube without purulent discharge or tenderness. Muscoloskeletal: no spinal tenderness of palpation of vertebral spine. Skin exam was without echymosis, petichae. Neuro exam was nonfocal. Patient was able to get on and off exam table without assistance. Gait was normal. Patient was alerted and oriented. Attention was  good. Language was appropriate. Mood: very flat affect.  Speech was not pressured. Thought content was not tangential.    Studies/Results: Results for orders placed during the hospital encounter of 08/10/11 (from the past 48 hour(s))  GLUCOSE, CAPILLARY     Status: Abnormal   Collection Time   08/17/11  5:09 PM      Component Value Range Comment   Glucose-Capillary 106 (*) 70 - 99 (mg/dL)   GLUCOSE, CAPILLARY     Status: Normal   Collection Time   08/17/11  9:52 PM      Component Value Range Comment   Glucose-Capillary 82  70 - 99 (mg/dL)   CBC     Status: Abnormal   Collection Time   08/18/11  4:22 AM      Component Value Range Comment   WBC 24.0 (*) 4.0 - 10.5 (K/uL)    RBC 3.09 (*) 4.22 - 5.81 (MIL/uL)    Hemoglobin 8.8 (*) 13.0 - 17.0 (g/dL)    HCT 78.2 (*) 95.6 - 52.0 (%)    MCV 83.5  78.0 - 100.0 (fL)    MCH 28.5  26.0 - 34.0 (pg)    MCHC 34.1  30.0 - 36.0 (g/dL)    RDW 21.3 (*) 08.6 - 15.5 (%)    Platelets 161  150 - 400 (K/uL)   BASIC METABOLIC PANEL     Status: Abnormal   Collection Time   08/18/11  4:22 AM      Component Value Range Comment   Sodium 136  135 - 145 (mEq/L)    Potassium 3.4 (*) 3.5 - 5.1 (mEq/L)    Chloride 104  96 - 112 (mEq/L)    CO2 24  19 - 32 (mEq/L)    Glucose, Bld 74  70 - 99 (mg/dL)    BUN 10  6 - 23 (mg/dL)    Creatinine, Ser 1.61  0.50 - 1.35 (mg/dL)    Calcium 8.4  8.4 - 10.5 (mg/dL)    GFR calc non Af Amer 86 (*) >90 (mL/min)    GFR calc Af Amer >90  >90 (mL/min)   GLUCOSE, CAPILLARY     Status: Normal   Collection Time   08/18/11  8:09 AM      Component Value Range Comment   Glucose-Capillary 86  70 - 99 (mg/dL)   GLUCOSE, CAPILLARY     Status: Normal   Collection Time   08/18/11 12:48 PM      Component Value Range Comment   Glucose-Capillary 97  70 - 99 (mg/dL)   GLUCOSE, CAPILLARY     Status: Normal   Collection Time   08/18/11  5:24 PM      Component Value Range Comment   Glucose-Capillary 94  70 - 99 (mg/dL)   GLUCOSE,  CAPILLARY     Status: Normal   Collection Time   08/18/11  9:13 PM      Component Value Range Comment   Glucose-Capillary 99  70 - 99 (mg/dL)   COMPREHENSIVE METABOLIC PANEL     Status: Abnormal   Collection Time   08/19/11  5:00 AM      Component Value Range Comment   Sodium 134 (*) 135 - 145 (mEq/L)    Potassium 3.7  3.5 - 5.1 (mEq/L)    Chloride 104  96 - 112 (mEq/L)    CO2 21  19 - 32 (mEq/L)    Glucose, Bld 66 (*) 70 - 99 (mg/dL)    BUN 9  6 - 23 (mg/dL)    Creatinine, Ser 0.96  0.50 - 1.35 (mg/dL)    Calcium 8.6  8.4 - 10.5 (mg/dL)    Total Protein 5.7 (*) 6.0 - 8.3 (g/dL)    Albumin 2.2 (*) 3.5 - 5.2 (g/dL)    AST 21  0 - 37 (U/L)    ALT 12  0 - 53 (U/L)    Alkaline Phosphatase 141 (*) 39 - 117 (U/L)    Total Bilirubin 1.5 (*) 0.3 - 1.2 (mg/dL)    GFR calc non Af Amer 88 (*) >90 (mL/min)    GFR calc Af Amer >90  >90 (mL/min)   CBC     Status: Abnormal   Collection Time   08/19/11  5:00 AM      Component Value Range Comment   WBC 21.2 (*) 4.0 - 10.5 (K/uL)    RBC 3.13 (*) 4.22 - 5.81 (MIL/uL)    Hemoglobin 8.8 (*) 13.0 - 17.0 (g/dL)    HCT 04.5 (*) 40.9 - 52.0 (%)    MCV 83.4  78.0 - 100.0 (fL)    MCH 28.1  26.0 - 34.0 (pg)    MCHC 33.7  30.0 - 36.0 (g/dL)    RDW 81.1 (*) 91.4 - 15.5 (%)    Platelets 170  150 - 400 (K/uL)   GLUCOSE, CAPILLARY     Status: Normal   Collection Time   08/19/11  8:21 AM      Component Value Range Comment   Glucose-Capillary 81  70 - 99 (mg/dL)        Assessment/Plan:  1. Metastatic pancreatic cancer. He is s/p 3  cycles of chemo with grade 2-3 fatigue, grade 3 cytopenia with neutropenic fever after cycle #2. His dose was decreased after the 2nd cycle.  Even with dose reduction, he still has severe fatigue.  I discussed with Mitchell Hines that regardless of the response to chemo on this upcoming restaging CT (08/23/11), chemotherapy at this time has more harms than benefit.  I recommended that he concentrate on his nutrition, physical  therapy for now.  If in the future, his performance status significantly improved, we may readdress the need to resume chemo.    2. 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 He has perc bil drainage.  I recommend keeping this instead of internalizing it until he has sign of improvement.  3. Renal insufficiency: Due to poor PO intake. He's on IVF resuscitation.  Cr is now baseline.  4. History of pulmonary embolism, subsegmental. This asymptomatic. He is on Lovenox 70 mg SQ daily now for renal dose. When his repeat CT chest shows resolution of the PE, we may go back to prophylactic dosing of Lononox.  5. Calorie protein malnutrition:  He is on marinol and low dose Prednisone.   6. Diabetes mellitus, type II. He is on sitagliptin per PCP. 7. Nausea and vomiting prophylaxis. He has Compazine, Zofran, Reglan, and Ativan p.r.n.  8. Hypertension:  Off of ACE-I due to acute renal insufficiency.  9. Pancreatic insufficiency from pancreatic cancer. He is on Creon 1 capsule 3 times a day. I advised him to increase it to 2 capsules if he eats fatty food. 10. BPH:  On terazosin.  11. Code status:  Today, I discussed with him again that if he were to have cardiopulmonary arrest, the chance of meaningful recovery is very low given his poor prognosis. He will would like to remain full code for now and will discuss with his wife.  However, he is leaning toward DNR/DNI.  12.  Dispo:  I strongly recommend home hospice.  He does not want to go to [facility] place for SNF for PT/OT.  I'll see him in clinic next week 08/26/11.

## 2011-08-19 NOTE — Progress Notes (Signed)
Physical Therapy Treatment Patient Details Name: Mitchell Hines MRN: [id number] DOB: 09-16-1940 Today's Date: 08/19/2011 10:05 - 10:25 1 gt PT Assessment/Plan  PT - Assessment/Plan Comments on Treatment Session: Pt states, "I get short of breath" PT Plan: Discharge plan remains appropriate PT Frequency: Min 3X/week Follow Up Recommendations: Home health PT PT Goals  Acute Rehab PT Goals PT Goal Formulation: With patient Pt will go Supine/Side to Sit: with modified independence PT Goal: Supine/Side to Sit - Progress: Progressing toward goal Pt will Stand: with modified independence PT Goal: Stand - Progress: Progressing toward goal Pt will Ambulate: 51 - 150 feet;with least restrictive assistive device;with modified independence PT Goal: Ambulate - Progress: Progressing toward goal Pt will Go Up / Down Stairs: 3-5 stairs;with supervision PT Goal: Up/Down Stairs - Progress: Progressing toward goal Pt will Perform Home Exercise Program: with supervision, verbal cues required/provided PT Goal: Perform Home Exercise Program - Progress: Progressing toward goal  PT Treatment Precautions/Restrictions  Precautions Precautions: Fall Required Braces or Orthoses: No Restrictions Weight Bearing Restrictions: No Mobility (including Balance) Bed Mobility Supine to Sit: 5: Supervision Transfers Transfers: Yes Sit to Stand:  (MinGuard assist) Sit to Stand Details (indicate cue type and reason): good safety and use of hands Stand to Sit:  (MinGuard assist) Stand to Sit Details: good use of hands to control descend Ambulation/Gait Ambulation/Gait: Yes Ambulation/Gait Assistance:  (MinGuard assist) Ambulation Distance (Feet): 75 Feet Assistive device: Rolling walker Gait Pattern: Step-to pattern Gait velocity: mild c/o SOB during act RA 100% and HR 128 Stairs: No Wheelchair Mobility Wheelchair Mobility: No    Exercise    End of Session PT - End of Session Equipment Utilized During  Treatment: Gait belt Activity Tolerance: Patient tolerated treatment well;Patient limited by fatigue Patient left: in bed;with call bell in reach General Behavior During Session: [facility] for tasks performed Cognition: [facility] for tasks performed Felecia Shelling PTA [facility]  Acute  Rehab Pager     [phone]

## 2011-08-19 NOTE — Progress Notes (Signed)
ANTICOAGULATION CONSULT NOTE - FOLLOW UP  Pharmacy Consult for Lovenox for Hx DVT  No Known Allergies  Patient Measurements: Height: 6' (182.9 cm) Weight: 163 lb 8 oz (74.163 kg) IBW/kg (Calculated) : 77.6    Vital Signs: Temp: 98.1 F (36.7 C) (12/10 1438) Temp src: Oral (12/10 1438) BP: 116/70 mmHg (12/10 1438) Pulse Rate: 79  (12/10 1438)  Labs:  Basename 08/19/11 0500 08/18/11 0422 08/17/11 0600  HGB 8.8* 8.8* --  HCT 26.1* 25.8* 25.9*  PLT 170 161 155  APTT -- -- --  LABPROT -- -- --  INR -- -- --  HEPARINUNFRC -- -- --  CREATININE 0.80 0.85 0.92  CKTOTAL -- -- --  CKMB -- -- --  TROPONINI -- -- --   Estimated Creatinine Clearance: 90.2 ml/min (by C-G formula based on Cr of 0.8).  Medical History: Past Medical History  Diagnosis Date  . Pancreas cancer 2012  . Cholestasis 2012  . Liver metastases 2012  . Lung metastases 2012  . BPH (benign prostatic hyperplasia) 2012    with Dr. Alfredo Martinez  . Pulmonary embolism 2012  . Weight loss, non-intentional 2012  . Poor appetite 2012  . GERD (gastroesophageal reflux disease) 06/29/2011  . Pancreatic insufficiency 06/29/2011  . Hypertension 06/29/2011  . Hemorrhoid 06/29/2011  . Protein calorie malnutrition   . Neutropenia   . UTI (lower urinary tract infection)   . Diabetes mellitus     oral    Medications:  Scheduled:     . aspirin  81 mg Oral Daily  . baclofen  5 mg Oral TID  . dronabinol  10 mg Oral BID AC  . DULoxetine  30 mg Oral Daily  . enoxaparin (LOVENOX) injection  120 mg Subcutaneous Q24H  . hydrocortisone   Rectal TID  . insulin aspart  0-9 Units Subcutaneous TID WC  . linagliptin  5 mg Oral Daily  . lipase/protease/amylase  2 capsule Oral TID AC  . methylphenidate  5 mg Oral BID WC  . miconazole   Topical BID  . pantoprazole  40 mg Oral Q1200  . potassium chloride  40 mEq Oral BID  . predniSONE  5 mg Oral QAC breakfast  . senna-docusate  1 tablet Oral Daily  . sucralfate  1 g  Oral QID  . terazosin  5 mg Oral QHS    Assessment:  70 yo M w/hx DVT on Lovenox 120mg  sq daily, receiving dose at [facility] as outpatient   Dose was reduced to 70mg  sq q24h on admission since CrCl was < 16ml/min   Dose increased back to 120mg  (1.5mg /kg) q24h on 12/4 since CrCl improved to > 30 ml/min   No further bleeding events reported in chart   CBC remains low, but stable   Plan:   Continue current Lovenox regimen  Continue to follow renal function and s/sx bleeding  Annia Belt 08/19/2011,2:39 PM

## 2011-08-20 ENCOUNTER — Other Ambulatory Visit: Payer: Self-pay | Admitting: Oncology

## 2011-08-20 ENCOUNTER — Telehealth: Payer: Self-pay | Admitting: *Deleted

## 2011-08-20 ENCOUNTER — Ambulatory Visit: Payer: Medicare Other

## 2011-08-20 NOTE — Telephone Encounter (Signed)
Message on voicemail from Diane, [facility] stating that pt has been admitted to home health and an order is needed from Dr. Gaylyn Rong. Order can be faxed to [facility] attn [employer]	[place]. Diane call back # [phone] (cell). Will notify MD

## 2011-08-21 ENCOUNTER — Telehealth: Payer: Self-pay | Admitting: *Deleted

## 2011-08-21 ENCOUNTER — Telehealth: Payer: Self-pay | Admitting: Oncology

## 2011-08-21 ENCOUNTER — Ambulatory Visit: Payer: Medicare Other

## 2011-08-21 NOTE — Telephone Encounter (Signed)
Call from Hospice RN who just left home visit.  She states pt NOT admitted to Hospice at this time due to pt and wife saying they want to wait for CT scan results and office visit w/ Dr. Gaylyn Rong this Monday 08/26/11.  Pt and wife stating that pt may get chemotherapy depending on results of CT scan?   RN says that Dr. Barbee Shropshire wants to wait to admit pt to Hospice until pt sees Dr. Gaylyn Rong again and they decide for sure no further treatment.  She instructed to call Hospice again after office visit and they will admit pt if it is decided no further treatment.   Note to Dr. Gaylyn Rong.

## 2011-08-21 NOTE — Telephone Encounter (Signed)
VM from RN at [facility] to notify of interaction between Compazine and Reglan.  Dr. Gaylyn Rong ordered to d/c the Reglan.  Called Hospice admitting nurse who is going out to see pt at 3pm today.  Gave her orders to d/c Reglan and to please instruct pt on taking compazine prn nausea but stop the reglan.  She verbalized understanding.

## 2011-08-21 NOTE — Telephone Encounter (Signed)
Received orders from [facility] for homecare.  Per Dr. Gaylyn Rong,  He referred pt to [facility],  Not [facility].   He wants Hospice referral.  Called pt and he understands referral to Hospice and agrees,  Understands we are going to order Hospice to take over for [facility].  Called [facility] and gave referral w/ Dr. Gaylyn Rong as attending and for Hospice to provide symptom management.  Faxed progress notes to Hospice.  Rec'd VM back from [facility] stating they have appt to see pt this afternoon.   Called [facility] to inform of above.

## 2011-08-21 NOTE — Telephone Encounter (Signed)
Called pt for Dr. Gaylyn Rong and listened as Dr. Gaylyn Rong explained to pt via phone that he will not order any further chemotherapy.  Dr. Gaylyn Rong strongly recommended pt accept [facility] as ordered.  Dr. Gaylyn Rong informed pt he may seek second opinion if he desires.  He says pt may keep appt for CT on Friday and will see him in office on Monday as scheduled.

## 2011-08-21 NOTE — Telephone Encounter (Signed)
Pt's wife called to cx the appt for today pt is weak

## 2011-08-22 ENCOUNTER — Ambulatory Visit: Payer: Medicare Other

## 2011-08-23 ENCOUNTER — Ambulatory Visit: Payer: Medicare Other

## 2011-08-23 ENCOUNTER — Ambulatory Visit (HOSPITAL_COMMUNITY)
Admission: RE | Admit: 2011-08-23 | Discharge: 2011-08-23 | Disposition: A | Discharge status: Home / Self Care | Payer: Medicare Other | Source: Ambulatory Visit | Attending: Oncology | Admitting: Oncology

## 2011-08-23 DIAGNOSIS — C259 Malignant neoplasm of pancreas, unspecified: Secondary | ICD-10-CM | POA: Insufficient documentation

## 2011-08-23 DIAGNOSIS — I319 Disease of pericardium, unspecified: Secondary | ICD-10-CM | POA: Insufficient documentation

## 2011-08-23 DIAGNOSIS — N400 Enlarged prostate without lower urinary tract symptoms: Secondary | ICD-10-CM | POA: Insufficient documentation

## 2011-08-23 DIAGNOSIS — C787 Secondary malignant neoplasm of liver and intrahepatic bile duct: Secondary | ICD-10-CM | POA: Insufficient documentation

## 2011-08-23 DIAGNOSIS — J90 Pleural effusion, not elsewhere classified: Secondary | ICD-10-CM | POA: Insufficient documentation

## 2011-08-23 DIAGNOSIS — E042 Nontoxic multinodular goiter: Secondary | ICD-10-CM | POA: Insufficient documentation

## 2011-08-23 DIAGNOSIS — J9819 Other pulmonary collapse: Secondary | ICD-10-CM | POA: Insufficient documentation

## 2011-08-23 MED ORDER — IOHEXOL 300 MG/ML  SOLN
80.0000 mL | Freq: Once | INTRAMUSCULAR | Status: AC | PRN
  Administered 2011-08-23: 80 mL via INTRAVENOUS

## 2011-08-24 ENCOUNTER — Ambulatory Visit: Payer: Medicare Other

## 2011-08-26 ENCOUNTER — Other Ambulatory Visit: Payer: Self-pay | Admitting: Oncology

## 2011-08-26 ENCOUNTER — Ambulatory Visit (HOSPITAL_BASED_OUTPATIENT_CLINIC_OR_DEPARTMENT_OTHER): Payer: Medicare Other | Admitting: Oncology

## 2011-08-26 ENCOUNTER — Ambulatory Visit: Payer: Medicare Other

## 2011-08-26 ENCOUNTER — Other Ambulatory Visit (HOSPITAL_BASED_OUTPATIENT_CLINIC_OR_DEPARTMENT_OTHER): Payer: Medicare Other | Admitting: Lab

## 2011-08-26 ENCOUNTER — Inpatient Hospital Stay: Payer: Medicare Other

## 2011-08-26 DIAGNOSIS — C787 Secondary malignant neoplasm of liver and intrahepatic bile duct: Secondary | ICD-10-CM

## 2011-08-26 DIAGNOSIS — C259 Malignant neoplasm of pancreas, unspecified: Secondary | ICD-10-CM

## 2011-08-26 DIAGNOSIS — C7800 Secondary malignant neoplasm of unspecified lung: Secondary | ICD-10-CM

## 2011-08-26 DIAGNOSIS — K838 Other specified diseases of biliary tract: Secondary | ICD-10-CM

## 2011-08-26 LAB — CBC WITH DIFFERENTIAL/PLATELET
Eosinophils Absolute: 0.1 10*3/uL (ref 0.0–0.5)
HCT: 29.1 % — ABNORMAL LOW (ref 38.4–49.9)
LYMPH%: 8.6 % — ABNORMAL LOW (ref 14.0–49.0)
MCHC: 33.5 g/dL (ref 32.0–36.0)
MCV: 88.8 fL (ref 79.3–98.0)
MONO#: 1.1 10*3/uL — ABNORMAL HIGH (ref 0.1–0.9)
MONO%: 6.9 % (ref 0.0–14.0)
NEUT#: 12.7 10*3/uL — ABNORMAL HIGH (ref 1.5–6.5)
NEUT%: 83.8 % — ABNORMAL HIGH (ref 39.0–75.0)
Platelets: 149 10*3/uL (ref 140–400)
RBC: 3.28 10*6/uL — ABNORMAL LOW (ref 4.20–5.82)

## 2011-08-26 LAB — COMPREHENSIVE METABOLIC PANEL
Alkaline Phosphatase: 122 U/L — ABNORMAL HIGH (ref 39–117)
CO2: 22 mEq/L (ref 19–32)
Creatinine, Ser: 0.82 mg/dL (ref 0.50–1.35)
Glucose, Bld: 128 mg/dL — ABNORMAL HIGH (ref 70–99)
Sodium: 135 mEq/L (ref 135–145)
Total Bilirubin: 1.9 mg/dL — ABNORMAL HIGH (ref 0.3–1.2)
Total Protein: 6.8 g/dL (ref 6.0–8.3)

## 2011-08-26 LAB — CANCER ANTIGEN 19-9: CA 19-9: 35213.4 U/mL — ABNORMAL HIGH (ref <35.0)

## 2011-08-26 NOTE — Progress Notes (Signed)
[facility] OFFICE PROGRESS NOTE  Dartha Lodge, MD  CC: Zannie Kehr. 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   CURRENT THERAPY:  started on FOLFOX q2wks on 06/25/2011.  INTERVAL HISTORY: Mitchell Hines 70 y.o. male returns for regular follow up.  He is s/p 3 cycles of FOLFOX.  He was admitted to the hospital twice with 3 cycles of chemo.  The last admission was for failure to thrive, renal insufficiency.  He reported that he is trying his best to eat and drink.  However, he still continues to lose weight.  He denies gagging sensation, nausea/vomiting, abdominal pain.  He spends a lot of awake time in sitting at the computer desk.  He is not very active otherwise.  His wife works, and he is able to go to the bathroom himself.  Walking more than 20 feet, he feels tired, dizzy, and he has to sit down.  He denies CP, PND, pedal edema, bleeding symptoms.  He has moderate fatigue and does not feel likely doing anything more than sitting and working on his computer.   Patient denies headache, visual changes, confusion, drenching night sweats, palpable lymph node swelling, mucositis, odynophagia, dysphagia, nausea vomiting, jaundice, chest pain, palpitation, shortness of breath, dyspnea on exertion, productive cough, gum bleeding, epistaxis, hematemesis, hemoptysis, abdominal pain, abdominal swelling, early satiety, melena, hematochezia, hematuria, skin rash, spontaneous bleeding, joint swelling, joint pain, heat or cold intolerance, bowel bladder incontinence, back pain, focal motor weakness, paresthesia, depression, suicidal or homocidal ideation, feeling hopelessness.  MEDICAL HISTORY: Past Medical History  Diagnosis Date  . Pancreas cancer 2012  . Cholestasis 2012  . Liver metastases 2012  . Lung metastases 2012  . BPH (benign prostatic hyperplasia) 2012    with Dr. Alfredo Martinez  . Pulmonary embolism 2012  . Weight  loss, non-intentional 2012  . Poor appetite 2012  . GERD (gastroesophageal reflux disease) 06/29/2011  . Pancreatic insufficiency 06/29/2011  . Hypertension 06/29/2011  . Hemorrhoid 06/29/2011  . Protein calorie malnutrition   . Neutropenia   . UTI (lower urinary tract infection)   . Diabetes mellitus     oral    SURGICAL HISTORY:  Past Surgical History  Procedure Date  . Portacath placement 06/10/11 Powerport    Tip in lower SVC per Dr. Lowella Dandy  . Percutaneous external biliary drain placement 06/07/11    MEDICATIONS: Current Outpatient Prescriptions  Medication Sig Dispense Refill  . aspirin 81 MG chewable tablet Chew 81 mg by mouth daily.        . baclofen (LIORESAL) 10 MG tablet Take 5 mg by mouth 3 (three) times daily. For hiccups      . dronabinol (MARINOL) 2.5 MG capsule Take 2.5 mg by mouth 2 (two) times daily before a meal.        . DULoxetine (CYMBALTA) 30 MG capsule Take 1 capsule (30 mg total) by mouth daily.  30 capsule  0  . feeding supplement (ENSURE CLINICAL STRENGTH) LIQD Take 237 mLs by mouth 3 (three) times daily with meals.  90 Bottle  0  . HYDROcodone-acetaminophen (LORTAB) 7.5-500 MG/15ML solution Take 15 mLs by mouth every 6 (six) hours as needed. For pain      . hydrocortisone (ANUSOL-HC) 2.5 % rectal cream Place rectally 3 (three) times daily.  30 g  3  . lipase/protease/amylase (CREON-10/PANCREASE) 12000 UNITS CPEP Take 2 capsules by mouth 3 (three) times daily before meals. Take 1 capsule before  meals and if patient plans to eat a fatty food he will take 2 more capsules      . methylphenidate (RITALIN) 5 MG tablet Take 1 tablet (5 mg total) by mouth 2 (two) times daily with breakfast and lunch.  60 tablet  0  . miconazole (MICOTIN) 2 % cream Apply topically 2 (two) times daily. Apply topically 2 (two) times daily. Apply to rash in the groin area.  28.35 g  3  . omeprazole (PRILOSEC) 40 MG capsule Take 40 mg by mouth daily.        . ondansetron (ZOFRAN) 8 MG  tablet Take 8 mg by mouth every 12 (twelve) hours as needed.        . ondansetron (ZOFRAN-ODT) 8 MG disintegrating tablet Take 8 mg by mouth every 8 (eight) hours as needed. For nausea       . potassium chloride 20 MEQ/15ML (10%) solution Take 30 mLs (40 mEq total) by mouth 2 (two) times daily.  500 mL  3  . predniSONE (DELTASONE) 5 MG tablet Take 1 tablet (5 mg total) by mouth daily before breakfast.  30 tablet  0  . prochlorperazine (COMPAZINE) 10 MG tablet Take 10 mg by mouth every 6 (six) hours as needed. For nausea      . sitaGLIPtin (JANUVIA) 100 MG tablet Take 100 mg by mouth daily.        . sucralfate (CARAFATE) 1 G tablet Take 1 g by mouth 4 (four) times daily.        Marland Kitchen terazosin (HYTRIN) 5 MG capsule Take 5 mg by mouth at bedtime.          ALLERGIES:   has no known allergies.  REVIEW OF SYSTEMS:  The rest of the 14-point review of system was negative.   Filed Vitals:   08/26/11 0833  BP: 118/76  Pulse: 102  Temp: 97.1 F (36.2 C)  ECOG 1-2.   Wt Readings from Last 3 Encounters:  08/26/11 153 lb 1.6 oz (69.446 kg)  08/19/11 163 lb 8 oz (74.163 kg)  08/09/11 155 lb 12.8 oz (70.67 kg)   ECOG Performance status: 2  PHYSICAL EXAMINATION:   General:  Thin, cachetically appearing male in no acute distress.  Eyes:  No  icterus.  ENT:  There were no oropharyngeal lesions.  Neck was without thyromegaly.  Lymphatics:  Negative cervical, supraclavicular or axillary adenopathy.  Respiratory: lungs were clear bilaterally without wheezing or crackles.  Cardiovascular:  Regular rate and rhythm, S1/S2, without murmur, rub or gallop.  There was no pedal edema.  GI:  abdomen was soft, flat, nontender, nondistended, without organomegaly.  His percutaneous biliary drainage tube without purulent discharge or tenderness.  Muscoloskeletal:  no spinal tenderness of palpation of vertebral spine.  Skin exam was without echymosis, petichae.  Neuro exam was nonfocal.  Patient was able to get on and off  exam table without assistance.  Gait was normal.  Patient was alerted and oriented.  Attention was good.   Language was appropriate.  Mood was normal without depression.  Speech was not pressured.  Thought content was not tangential.     LABORATORY/RADIOLOGY DATA:  Lab Results  Component Value Date   WBC 15.2* 08/26/2011   HGB 9.8* 08/26/2011   HCT 29.1* 08/26/2011   PLT 149 08/26/2011   GLUCOSE 66* 08/19/2011   ALT 12 08/19/2011   AST 21 08/19/2011   NA 134* 08/19/2011   K 3.7 08/19/2011   CL 104 08/19/2011  CREATININE 0.80 08/19/2011   BUN 9 08/19/2011   CO2 21 08/19/2011   INR 1.78* 07/25/2011   HGBA1C 7.1* 08/10/2011   IMAGING:  I personally reviewed the CT of chest/abd/pelvis and showed a pictures to the patient and his wife. In brief, there was decrease in size of the pancreatic head mass and the lesions in the liver.  I discussed the case personally with Dr. Guinevere Ferrari re: patient previous subsegmental PE.  She could not definitively call this because this is nondiagnostic CT for PE protocol. However, there was no obvious large pulmonary embolus burden.  Ct Chest W Contrast  08/23/2011  *RADIOLOGY REPORT*  Clinical Data:  Metastatic pancreatic cancer.  CT CHEST, ABDOMEN AND PELVIS WITH CONTRAST  Technique:  Multidetector CT imaging of the chest, abdomen and pelvis was performed following the standard protocol during bolus administration of intravenous contrast.  Contrast: 80mL OMNIPAQUE IOHEXOL 300 MG/ML IV SOLN  Comparison:  PET CT 06/18/2011 and CT abdomen 05/31/2011.  CT CHEST  Findings:  The chest wall is unremarkable.  No supraclavicular or axillary lymphadenopathy.  There are bilateral thyroid gland lesions.  No abnormal uptake on the recent PET CT.  The bony structures are intact.  No destructive bone lesions or spinal canal compromise.  The heart is normal in size.  There is a small pericardial effusion.  This is progressive since the chest CT of 06/18/2011. No mediastinal  or hilar lymphadenopathy.  The aorta is normal in caliber.  No dissection.  The esophagus is grossly normal.  Examination of the lung parenchyma demonstrates stable emphysematous changes.  There is a small right pleural effusion and patchy right lower lobe airspace process which may reflect small infiltrates.  No findings to suggest pulmonary metastatic disease.  IMPRESSION:  1.  Small but enlarging pericardial effusion. 2.  Small right pleural effusion with overlying atelectasis.  There are also patchy right lower lobe infiltrates. 3.  Stable bilateral thyroid nodules.  CT ABDOMEN AND PELVIS  Findings:  Numerous metastatic hepatic lesions are again demonstrated.  The largest lesion in the right lobe on image number 66 is relatively stable.  It measures 19.5 mm and previously measured 19 mm.  Numerous other smaller hepatic lesions are slightly smaller and less conspicuous.  No new lesions.  The gallbladder is contracted.  There is an external biliary stent in place.  The tip is in the third portion the duodenum.  The spleen is normal in size.  No focal lesions.  The pancreatic head masses difficult to visualize and periods much smaller.  Vague area of low attenuation in the pancreatic head measures 15 x 11 mm. This area previously measured 30 x 19 mm.  Stable atrophy of the pancreatic body and tail and prominent main pancreatic duct.  The adrenal glands and kidneys are stable.  There are multiple bilateral renal cysts.  The stomach, duodenum, small bowel and colon are unremarkable. No enlarged mesenteric or retroperitoneal lymph nodes.  The celiac axis lymph nodes are smaller.  The node just posterior to the hepatic artery previously measured 21.5 x 14.0 mm and now measures 17.5 x 10.0 mm.  The node just anterior to the hepatic artery measured 28.5 x 10.5 mm and now measures 24.5 x 7.5 mm.  No new celiac axis or periportal lymph nodes.  The aorta is normal in caliber.  Stable atherosclerotic changes. The major  branch vessels are patent.  No retroperitoneal lymphadenopathy.  The bladder is unremarkable.  Stable prostate gland enlargement with median  lobe hypertrophy.  Stable urachal remnant.  No pelvic mass or adenopathy.  No inguinal mass or hernia.  The bony structures are stable.  Probable changes of ankylosing spondylitis.  IMPRESSION:  1.  Overall improved appearance of the CT scan when compared to the prior studies.  The pancreatic head mass is smaller, most of the liver lesions are smaller and the celiac axis lymph nodes are smaller. 2.  External internal biliary drainage catheter in place.  Original Report Authenticated By: P. Loralie Champagne, M.D.   Ct Abdomen Pelvis W Contrast  08/23/2011  *RADIOLOGY REPORT*  Clinical Data:  Metastatic pancreatic cancer.  CT CHEST, ABDOMEN AND PELVIS WITH CONTRAST  Technique:  Multidetector CT imaging of the chest, abdomen and pelvis was performed following the standard protocol during bolus administration of intravenous contrast.  Contrast: 80mL OMNIPAQUE IOHEXOL 300 MG/ML IV SOLN  Comparison:  PET CT 06/18/2011 and CT abdomen 05/31/2011.  CT CHEST  Findings:  The chest wall is unremarkable.  No supraclavicular or axillary lymphadenopathy.  There are bilateral thyroid gland lesions.  No abnormal uptake on the recent PET CT.  The bony structures are intact.  No destructive bone lesions or spinal canal compromise.  The heart is normal in size.  There is a small pericardial effusion.  This is progressive since the chest CT of 06/18/2011. No mediastinal or hilar lymphadenopathy.  The aorta is normal in caliber.  No dissection.  The esophagus is grossly normal.  Examination of the lung parenchyma demonstrates stable emphysematous changes.  There is a small right pleural effusion and patchy right lower lobe airspace process which may reflect small infiltrates.  No findings to suggest pulmonary metastatic disease.  IMPRESSION:  1.  Small but enlarging pericardial effusion. 2.  Small  right pleural effusion with overlying atelectasis.  There are also patchy right lower lobe infiltrates. 3.  Stable bilateral thyroid nodules.  CT ABDOMEN AND PELVIS  Findings:  Numerous metastatic hepatic lesions are again demonstrated.  The largest lesion in the right lobe on image number 66 is relatively stable.  It measures 19.5 mm and previously measured 19 mm.  Numerous other smaller hepatic lesions are slightly smaller and less conspicuous.  No new lesions.  The gallbladder is contracted.  There is an external biliary stent in place.  The tip is in the third portion the duodenum.  The spleen is normal in size.  No focal lesions.  The pancreatic head masses difficult to visualize and periods much smaller.  Vague area of low attenuation in the pancreatic head measures 15 x 11 mm. This area previously measured 30 x 19 mm.  Stable atrophy of the pancreatic body and tail and prominent main pancreatic duct.  The adrenal glands and kidneys are stable.  There are multiple bilateral renal cysts.  The stomach, duodenum, small bowel and colon are unremarkable. No enlarged mesenteric or retroperitoneal lymph nodes.  The celiac axis lymph nodes are smaller.  The node just posterior to the hepatic artery previously measured 21.5 x 14.0 mm and now measures 17.5 x 10.0 mm.  The node just anterior to the hepatic artery measured 28.5 x 10.5 mm and now measures 24.5 x 7.5 mm.  No new celiac axis or periportal lymph nodes.  The aorta is normal in caliber.  Stable atherosclerotic changes. The major branch vessels are patent.  No retroperitoneal lymphadenopathy.  The bladder is unremarkable.  Stable prostate gland enlargement with median lobe hypertrophy.  Stable urachal remnant.  No pelvic mass or  adenopathy.  No inguinal mass or hernia.  The bony structures are stable.  Probable changes of ankylosing spondylitis.  IMPRESSION:  1.  Overall improved appearance of the CT scan when compared to the prior studies.  The pancreatic head  mass is smaller, most of the liver lesions are smaller and the celiac axis lymph nodes are smaller. 2.  External internal biliary drainage catheter in place.  Original Report Authenticated By: P. Loralie Champagne, M.D.    ASSESSMENT AND PLAN:   1. Metastatic pancreatic cancer.  He is s/p 3 cycles of chemo with grade 3 fatigue, grade 3 cytopenia with neutropenic fever after cycle #2.  Despite dose modifications, he was admitted twice during the 3 cycles of chemo.  I discussed the case with Mr. [place]merville his wife in detail. Radiographically he has signs of response; however, his performance status has worsened to the point where I think further chemotherapy would cause more harm than benefit at this time. The goal of therapy is stage IV cancer is palliative and not curative. Even though he may achieve objective radiographic response, his performance status precludes further chemotherapy at this time. I discussed with Mr. Yadav his wife that I recommend one month chemotherapy holiday. If he is able to eat more, gain weight, improved performance status in 1 month I may reconsider chemotherapy that time. Chemotherapy therapy at that time be most likely either a single agent in the form of Xeloda or gemcitabine. 2. Cholestasis.  This was cholestasis from the cancer.  If his condition significantly improves and his Tbili is less than 2 in the future I may consider refer him back to IR to internalize his external drainage.  3. Renal insufficiency:  It improved when he was in the hospital receiving IVF.  His Cr today is pending. 4. History of pulmonary embolism, subsegmental.  This was asymptomatic and was an incidental finding to begin with.  The CT of the chest this time was not a PE protocol. However I do not want to expose him to more IV contrast because of his recent renal insufficiency. As he is a symptomatically recommended stopping treatment dose Lovenox. I discussed with Mr. Guderian in patients who are healthy  and receiving chemotherapy and have metastatic pancreas cancer and may consider prophylactic Lovenox dosing around 40 mg subcutaneous daily to prevent DVT and PE. He doesn't want to give any more injection at this time. I advised him to watch out for chest pain, shortness of breath, and leg swelling.  5. Pedal edema: this is resolved today.  6. Calorie protein malnutrition.  This is secondary to the cancer and the chemotherapy.  Continue with Marinol at 10mg  PO BID and Prednisone.    I advised him to continue Glucerna up to 3 cans daily.   7. Diabetes mellitus, type II.  He is on sitagliptin per PCP.  He checks his sugar once every day.  He may need insulin with his PCP if his Glc worsens with boost.  8. Nausea and vomiting prophylaxis.  He has Compazine, Zofran, and Ativan p.r.n.  9. Hypertension:  Off of meds due to poor PO intake causing hypotension.  10. Pancreatic insufficiency from pancreatic cancer.  He is on Creon 1 capsule 3 times a day.  I advised him to increase it to 2 capsules if he eats fatty food. 11. Code status. I discussed with Mr. Mcclain and his wife again today is in detail that he has something that is incurable and terminal. He prefers  to be DNR/DNI in case he has cardiopulmonary arrest. 12. Deconditioning:  This is from his recent chemotherapy and also his cancer.  He would like to start physical therapy to improve his stamina and endurance. I discussed with hospice to arrange for this at home. 13. Long term plan:  Patient preferred to be off chemotherapy at this time to go on a chemotherapy holiday. He will concentrate on improving his stamina and nutrition status. I will see him in 1 month and will reconsider the role of therapy at that time depending on his performance and nutritional status  Total length of time of the face-to-face encounter was 30 minutes.  More than 50% of time was spent in counseling and coordination of care.

## 2011-08-27 ENCOUNTER — Ambulatory Visit: Payer: Medicare Other

## 2011-08-28 ENCOUNTER — Ambulatory Visit: Payer: Medicare Other

## 2011-08-29 ENCOUNTER — Ambulatory Visit: Payer: Medicare Other

## 2011-08-30 ENCOUNTER — Ambulatory Visit: Payer: Medicare Other

## 2011-09-04 ENCOUNTER — Telehealth: Payer: Self-pay | Admitting: *Deleted

## 2011-09-04 DIAGNOSIS — Z66 Do not resuscitate: Secondary | ICD-10-CM | POA: Insufficient documentation

## 2011-09-04 DIAGNOSIS — Z515 Encounter for palliative care: Secondary | ICD-10-CM | POA: Insufficient documentation

## 2011-09-04 NOTE — Telephone Encounter (Signed)
Call from [facility]osp[facility] RN, Gunnar Fusi,  [place] pt requests DNR and if ok w/ Dr. Gaylyn Rong she will ask one of their medical directors, Dr. Barbee Shropshire to sign form so they can deliver to pt's home.  OK for DNR per Dr. Gaylyn Rong and I notified Gunnar Fusi.

## 2011-09-09 ENCOUNTER — Other Ambulatory Visit: Payer: Medicare Other | Admitting: Lab

## 2011-09-09 ENCOUNTER — Inpatient Hospital Stay: Payer: Medicare Other

## 2011-09-19 ENCOUNTER — Other Ambulatory Visit: Payer: Self-pay | Admitting: *Deleted

## 2011-09-19 MED ORDER — PREDNISONE 5 MG PO TABS: 5.0000 mg | ORAL_TABLET | Freq: Every day | ORAL | Status: AC

## 2011-09-19 MED ORDER — DULOXETINE HCL 30 MG PO CPEP: 30.0000 mg | ORAL_CAPSULE | Freq: Every day | ORAL | Status: DC

## 2011-09-19 NOTE — Telephone Encounter (Signed)
Call from Hosp[facility] RN,  States pt needs refill on Cymbalta and Prednisone.   Dr. Gaylyn Rong gave order may refill cymbalta.  May also refill prednisone if pt not currently also taking remeron and marinol.  Per Hospice RN, Gunnar Fusi,  Pt is not on marinol or remeron.    Cymbalta and Prednisone refilled electronically to [facility] on [address].

## 2011-09-24 ENCOUNTER — Ambulatory Visit (HOSPITAL_BASED_OUTPATIENT_CLINIC_OR_DEPARTMENT_OTHER): Payer: Medicare Other | Admitting: Oncology

## 2011-09-24 ENCOUNTER — Telehealth: Payer: Self-pay | Admitting: *Deleted

## 2011-09-24 ENCOUNTER — Telehealth: Payer: Self-pay | Admitting: Oncology

## 2011-09-24 ENCOUNTER — Other Ambulatory Visit (HOSPITAL_BASED_OUTPATIENT_CLINIC_OR_DEPARTMENT_OTHER): Payer: Medicare Other | Admitting: Lab

## 2011-09-24 DIAGNOSIS — C787 Secondary malignant neoplasm of liver and intrahepatic bile duct: Secondary | ICD-10-CM

## 2011-09-24 DIAGNOSIS — C259 Malignant neoplasm of pancreas, unspecified: Secondary | ICD-10-CM

## 2011-09-24 DIAGNOSIS — C7800 Secondary malignant neoplasm of unspecified lung: Secondary | ICD-10-CM

## 2011-09-24 DIAGNOSIS — E46 Unspecified protein-calorie malnutrition: Secondary | ICD-10-CM

## 2011-09-24 LAB — CBC WITH DIFFERENTIAL/PLATELET
BASO%: 0.5 % (ref 0.0–2.0)
Eosinophils Absolute: 0.1 10*3/uL (ref 0.0–0.5)
LYMPH%: 16.9 % (ref 14.0–49.0)
MCHC: 33 g/dL (ref 32.0–36.0)
MONO#: 0.9 10*3/uL (ref 0.1–0.9)
NEUT#: 8.9 10*3/uL — ABNORMAL HIGH (ref 1.5–6.5)
Platelets: 216 10*3/uL (ref 140–400)
RBC: 4.11 10*6/uL — ABNORMAL LOW (ref 4.20–5.82)
RDW: 16.6 % — ABNORMAL HIGH (ref 11.0–14.6)
WBC: 11.9 10*3/uL — ABNORMAL HIGH (ref 4.0–10.3)

## 2011-09-24 LAB — COMPREHENSIVE METABOLIC PANEL
ALT: 39 U/L (ref 0–53)
Albumin: 3.8 g/dL (ref 3.5–5.2)
CO2: 23 mEq/L (ref 19–32)
Calcium: 10.2 mg/dL (ref 8.4–10.5)
Chloride: 101 mEq/L (ref 96–112)
Glucose, Bld: 119 mg/dL — ABNORMAL HIGH (ref 70–99)
Potassium: 5 mEq/L (ref 3.5–5.3)
Sodium: 132 mEq/L — ABNORMAL LOW (ref 135–145)
Total Bilirubin: 1 mg/dL (ref 0.3–1.2)
Total Protein: 7.4 g/dL (ref 6.0–8.3)

## 2011-09-24 LAB — CANCER ANTIGEN 19-9: CA 19-9: 107303 U/mL — ABNORMAL HIGH (ref <35.0)

## 2011-09-24 MED ORDER — HYDROCODONE-ACETAMINOPHEN 7.5-500 MG/15ML PO SOLN: 15.0000 mL | Freq: Four times a day (QID) | ORAL | Status: DC | PRN

## 2011-09-24 NOTE — Telephone Encounter (Signed)
gv pt appt schedule for feb °

## 2011-09-24 NOTE — Telephone Encounter (Signed)
Pt seen in office this morning and wife asked for more Biliary drain bags w/ tubing.  She states Hospice not supplying them.  Called [facility] and they will supply one bag for pt but state Hospice is responsible for supplying them..    Sent wife to Radiology to pick up one bag and informed I will contact Hospice about supplying them in the future. I called Hospice and left VM for RN,  Gunnar Fusi.  Informed her that it is this RNs understanding the supplies for pt's Biliary Drain should be covered under Hospice and she can call IR to get ordering information if needed. Asked her to call us back if this is not the case.

## 2011-09-24 NOTE — Progress Notes (Signed)
Mitchell Hines  Mitchell Hines, Mitchell Hines  CC: Mitchell Hines. Mitchell Hines, Mitchell Hines   DIAGNOSIS: Metastatic pancreatic adenocarcinoma with met to lungs, and liver.   PAST THERAPY:  S/p cycles of FOLFOX q2wks with partial response on restaging CT.  However, chemotherapy was discontinued after the 3rd cycle in November 2012 due to grade 3 calorie/protein malnutrition, weight loss, ECOG of 3.   CURRENT THERAPY:  Symptom-management with [facility].   INTERVAL HISTORY: Mitchell Hines 71 y.o. male returns for regular follow up.  Being off of chemotherapy, Mitchell strength has improved. He was able to ambulate from the front of the cancer Center to the clinic without using a wheelchair. He still continues to have progressive weight loss despite improved appetite. He denies any jaundice, nausea vomiting, mucositis, abdominal pain. He only takes Mitchell Vicodin sparingly. He denies headache, depression, bleeding symptom, neuropathy, back pain, abdominal swelling, early satiety, dysphagia, odynophagia. He has mild to moderate fatigue; however he is able to leave the house maybe once a week with Mitchell Hines.   MEDICAL HISTORY: Past Medical History  Diagnosis Date  . Pancreas cancer 2012  . Cholestasis 2012  . Liver metastases 2012  . Lung metastases 2012  . BPH (benign prostatic hyperplasia) 2012    with Dr. Alfredo Hines  . Pulmonary embolism 2012  . Weight loss, non-intentional 2012  . Poor appetite 2012  . GERD (gastroesophageal reflux disease) 06/29/2011  . Pancreatic insufficiency 06/29/2011  . Hypertension 06/29/2011  . Hemorrhoid 06/29/2011  . Protein calorie malnutrition   . Neutropenia   . UTI (lower urinary tract infection)   . Diabetes mellitus     oral    SURGICAL HISTORY:  Past Surgical History  Procedure Date  . Portacath placement 06/10/11 Powerport    Tip in lower SVC per Mitchell Hines  . Percutaneous external biliary drain placement 06/07/11     MEDICATIONS: Current Outpatient Prescriptions  Medication Sig Dispense Refill  . aspirin 81 MG chewable tablet Chew 81 mg by mouth daily.        . baclofen (LIORESAL) 10 MG tablet Take 5 mg by mouth 3 (three) times daily. For hiccups      . DULoxetine (CYMBALTA) 30 MG capsule Take 1 capsule (30 mg total) by mouth daily.  30 capsule  5  . HYDROcodone-acetaminophen (LORTAB) 7.5-500 MG/15ML solution Take 15 mLs by mouth every 6 (six) hours as needed. For pain  480 mL  0  . lipase/protease/amylase (CREON-10/PANCREASE) 12000 UNITS CPEP Take 2 capsules by mouth 3 (three) times daily before meals. Take 1 capsule before meals and if patient plans to eat a fatty food he will take 2 more capsules      . miconazole (MICOTIN) 2 % cream Apply topically 2 (two) times daily. Apply topically 2 (two) times daily. Apply to rash in the groin area.  28.35 g  3  . omeprazole (PRILOSEC) 40 MG capsule Take 40 mg by mouth daily.        . ondansetron (ZOFRAN) 8 MG tablet Take 8 mg by mouth every 12 (twelve) hours as needed.        . ondansetron (ZOFRAN-ODT) 8 MG disintegrating tablet Take 8 mg by mouth every 8 (eight) hours as needed. For nausea       . predniSONE (DELTASONE) 5 MG tablet Take 1 tablet (5 mg total) by mouth daily.  30 tablet  5  . prochlorperazine (COMPAZINE) 10 MG tablet Take 10 mg by mouth  every 6 (six) hours as needed. For nausea      . sitaGLIPtin (JANUVIA) 100 MG tablet Take 100 mg by mouth daily.        . sucralfate (CARAFATE) 1 G tablet Take 1 g by mouth 4 (four) times daily.        Marland Kitchen terazosin (HYTRIN) 5 MG capsule Take 5 mg by mouth at bedtime.        . feeding supplement (ENSURE CLINICAL STRENGTH) LIQD Take 237 mLs by mouth 3 (three) times daily with meals.  90 Bottle  0    ALLERGIES:   has no known allergies.  REVIEW OF SYSTEMS:  The rest of the 14-point review of system was negative.   Filed Vitals:   09/24/11 0931  BP: 120/78  Pulse: 110  Temp: 97 F (36.1 C)  ECOG 1-2.    Wt Readings from Last 3 Encounters:  09/24/11 144 lb 11.2 oz (65.635 kg)  08/26/11 153 lb 1.6 oz (69.446 kg)  08/19/11 163 lb 8 oz (74.163 kg)   ECOG Performance status: 2  PHYSICAL EXAMINATION:   General:  Thin, cachetically appearing male in no acute distress.  Eyes:  No  icterus.  ENT:  There were no oropharyngeal lesions.  Neck was without thyromegaly.  Lymphatics:  Negative cervical, supraclavicular or axillary adenopathy.  Respiratory: lungs were clear bilaterally without wheezing or crackles.  Cardiovascular:  Regular rate and rhythm, S1/S2, without murmur, rub or gallop.  There was no pedal edema.  GI:  abdomen was soft, flat, nontender, nondistended, without organomegaly.  Mitchell percutaneous biliary drainage tube without purulent discharge or tenderness.  Muscoloskeletal:  no spinal tenderness of palpation of vertebral spine.  Skin exam was without echymosis, petichae.  Neuro exam was nonfocal.  Patient was able to get on and off exam table without assistance.  Gait was normal.  Patient was alerted and oriented.  Attention was good.   Language was appropriate.  Mood was normal without depression.  Speech was not pressured.  Thought content was not tangential.     LABORATORY/RADIOLOGY DATA:  Lab Results  Component Value Date   WBC 11.9* 09/24/2011   HGB 12.2* 09/24/2011   HCT 37.0* 09/24/2011   PLT 216 09/24/2011   GLUCOSE 119* 09/24/2011   ALT 39 09/24/2011   AST 26 09/24/2011   NA 132* 09/24/2011   K 5.0 09/24/2011   CL 101 09/24/2011   CREATININE 1.08 09/24/2011   BUN 18 09/24/2011   CO2 23 09/24/2011   INR 1.78* 07/25/2011   HGBA1C 7.1* 08/10/2011       ASSESSMENT AND PLAN:   1. Metastatic pancreatic cancer.  He is s/p 3 cycles of chemo with grade 3 fatigue, grade 3 cytopenia with neutropenic fever after cycle #2.  Despite dose modifications, he was admitted twice during the 3 cycles of chemo.  Mitchell Hines and Mitchell Hines said that being off of chemotherapy, Mitchell quality of life has  significantly improved.  Mitchell Hines inquires about possibility of prone "slight" chemotherapy today.  I discussed with him that some people may consider further chemotherapy alone. However Mitchell case, it is my opinion that despite chemotherapy will only improve Mitchell duration of response to chemotherapy; however eventually Mitchell disease will eventually progress.   To treat off off this chemotherapy isn't able for sure have worsening side effects such as fatigue, worsening quality of life, more been down, mucositis, cytopenia, infection, chest pain, skin rash, diarrhea. At this time Mitchell Hines would like to  remain off of chemotherapy.  I will see him again in about 6 weeks to reassess Mitchell status. 2. Pedal edema: this is resolved today.  3. Calorie protein malnutrition.  This is secondary to the cancer.  Mitchell albumin improved; but Mitchell weight continues to decrease.  He is on no appetite at this time because none has worked in the past for him.   4. Diabetes mellitus, type II.  He is on sitagliptin per PCP.  He checks Mitchell sugar once every day.  He checks Mitchell fasting sugar which is normally less than 150. 5. Nausea and vomiting prophylaxis.  He has Compazine, Zofran, and Mitchell Hines p.r.n.  6. Hypertension:  Off of meds due to poor PO intake causing hypotension.  7. Pancreatic insufficiency from pancreatic cancer.  He is on Creon 1 capsule 3 times a day.  I advised him to increase it to 2 capsules if he eats fatty food. 8. Code status.  DNR/DNI as previously discussed.  9. Deconditioning:  Improved off of chemo.  I encouraged him to be active than just working on Mitchell computer.   Total length of time of the face-to-face encounter was 15 minutes.  More than 50% of time was spent in counseling and coordination of care.

## 2011-09-29 ENCOUNTER — Telehealth: Payer: Self-pay | Admitting: Genetic Counselor

## 2011-10-08 ENCOUNTER — Ambulatory Visit (HOSPITAL_COMMUNITY)
Admission: RE | Admit: 2011-10-08 | Discharge: 2011-10-08 | Disposition: A | Discharge status: Home / Self Care | Source: Ambulatory Visit | Attending: Oncology | Admitting: Oncology

## 2011-10-08 ENCOUNTER — Other Ambulatory Visit (HOSPITAL_COMMUNITY): Payer: Self-pay | Admitting: Interventional Radiology

## 2011-10-08 ENCOUNTER — Other Ambulatory Visit: Payer: Self-pay | Admitting: Oncology

## 2011-10-08 ENCOUNTER — Ambulatory Visit: Payer: Medicare Other | Admitting: Nutrition

## 2011-10-08 DIAGNOSIS — C259 Malignant neoplasm of pancreas, unspecified: Secondary | ICD-10-CM | POA: Insufficient documentation

## 2011-10-08 DIAGNOSIS — C801 Malignant (primary) neoplasm, unspecified: Secondary | ICD-10-CM | POA: Insufficient documentation

## 2011-10-08 DIAGNOSIS — Y838 Other surgical procedures as the cause of abnormal reaction of the patient, or of later complication, without mention of misadventure at the time of the procedure: Secondary | ICD-10-CM | POA: Insufficient documentation

## 2011-10-08 DIAGNOSIS — T85898A Other specified complication of other internal prosthetic devices, implants and grafts, initial encounter: Secondary | ICD-10-CM | POA: Insufficient documentation

## 2011-10-08 DIAGNOSIS — K831 Obstruction of bile duct: Secondary | ICD-10-CM

## 2011-10-08 MED ORDER — IOHEXOL 300 MG/ML  SOLN: 20.0000 mL | Freq: Once | INTRAMUSCULAR | Status: AC | PRN

## 2011-10-08 MED ORDER — CEFTRIAXONE SODIUM 1 G IJ SOLR
1.0000 g | INTRAMUSCULAR | Status: AC
  Administered 2011-10-08: 1 g via INTRAMUSCULAR
  Filled 2011-10-08: qty 10

## 2011-10-08 NOTE — Progress Notes (Signed)
Received call from [facility] Nurse---[phone]), stating that patient is c/o increased pain @ biliary drain site; states that drain stopped draining over the weekend, but began to drain again on Monday with only a small amount of drainage; states that patient's pain is continuing to increase; called IR (Tina---[phone]) and scheduled patient to come in today @ 1400 for evaluation of drain.

## 2011-10-08 NOTE — Assessment & Plan Note (Signed)
I called [name] on the telephone today.  He was identified as a positive malnutrition risk using the Nutrition Risk Screen secondary to prolonged weight loss and eating poorly because of a decreased appetite.  Mitchell Hines is a 71 year old male patient of Dr. Lodema Pilot diagnosed with metastatic pancreatic cancer.  History includes cholestasis, liver and lung metastases, BPH, pulmonary embolism, unintentional weight loss, poor appetite, GERD, pancreatic insufficiency, hypertension,  protein calorie malnutrition and diabetes mellitus.  MEDICATIONS INCLUDE:  Cymbalta, Ensure Plus t.i.d., Lortab, Creon,Prilosec, Zofran, Compazine, Januvia, Carafate.  LABS:  Include sodium of 132, glucose 119.  HEIGHT:  6 feet. WEIGHT:  144.7 pounds documented January 15. USUAL BODY WEIGHT:  Approximately 175 pounds in November of 2012. BMI:  19.62.  The patient reports poor appetite, however, this is slowly improving secondary to him not receiving chemotherapy at this time.  He reports that he was told by his doctor to drink Ensure 3 times a day.  However, he does not care much for the taste of it.  He basically is forcing himself to drink as much as he can.  NUTRITION DIAGNOSIS:  Inadequate oral intake related to diagnosis of metastatic pancreatic cancer and associated treatments as evidenced by 17% weight loss from usual body weight.    The patient meets criteria for severe malnutrition in the context of chronic illness secondary to 17% weight loss in 2 months and less than 75% of estimated energy requirements for greater than 1 month.  INTERVENTION:  I educated the patient on strategies for increasing oral intake in small amounts throughout the day.  I have encouraged him to include high-calorie high-protein foods.  I have educated him on strategies for making milkshakes out of his Ensure or Boost, and I have also recommended he try [employer] as an alternative to his Ensure supplements.  I will mail  patient fact sheets and recipes for him to refer to along with some coupons.  MONITORING/EVALUATION/GOALS:  The patient will tolerate increased oral diet for an increased quality of life.  NEXT VISIT:  There is no followup scheduled at this time, however the patient was encouraged to call me with questions or concerns.    ______________________________ Zenovia Jarred, RD, LDN Clinical Nutrition Specialist BN/MEDQ  D:  10/08/2011  T:  10/08/2011  Job:  [id number]

## 2011-10-08 NOTE — Procedures (Signed)
Successful fluoro biliary internal external cath exchg No comp Stable Consider placing perm. Stent in the distal cbd Will discuss with Drs. [place] and [place]

## 2011-10-09 ENCOUNTER — Encounter (HOSPITAL_COMMUNITY): Payer: Self-pay | Admitting: Pharmacy Technician

## 2011-10-09 ENCOUNTER — Encounter: Payer: Self-pay | Admitting: Oncology

## 2011-10-14 ENCOUNTER — Other Ambulatory Visit: Payer: Self-pay | Admitting: Radiology

## 2011-10-16 ENCOUNTER — Encounter (HOSPITAL_COMMUNITY): Payer: Self-pay

## 2011-10-16 ENCOUNTER — Ambulatory Visit (HOSPITAL_COMMUNITY)
Admission: RE | Admit: 2011-10-16 | Discharge: 2011-10-16 | Disposition: A | Discharge status: Home / Self Care | Source: Ambulatory Visit | Attending: Interventional Radiology | Admitting: Interventional Radiology

## 2011-10-16 ENCOUNTER — Other Ambulatory Visit (HOSPITAL_COMMUNITY): Payer: Self-pay | Admitting: Interventional Radiology

## 2011-10-16 DIAGNOSIS — R5383 Other fatigue: Secondary | ICD-10-CM | POA: Insufficient documentation

## 2011-10-16 DIAGNOSIS — C7800 Secondary malignant neoplasm of unspecified lung: Secondary | ICD-10-CM | POA: Insufficient documentation

## 2011-10-16 DIAGNOSIS — K219 Gastro-esophageal reflux disease without esophagitis: Secondary | ICD-10-CM | POA: Insufficient documentation

## 2011-10-16 DIAGNOSIS — C787 Secondary malignant neoplasm of liver and intrahepatic bile duct: Secondary | ICD-10-CM | POA: Insufficient documentation

## 2011-10-16 DIAGNOSIS — N400 Enlarged prostate without lower urinary tract symptoms: Secondary | ICD-10-CM | POA: Insufficient documentation

## 2011-10-16 DIAGNOSIS — E119 Type 2 diabetes mellitus without complications: Secondary | ICD-10-CM | POA: Insufficient documentation

## 2011-10-16 DIAGNOSIS — I10 Essential (primary) hypertension: Secondary | ICD-10-CM | POA: Insufficient documentation

## 2011-10-16 DIAGNOSIS — K831 Obstruction of bile duct: Secondary | ICD-10-CM

## 2011-10-16 DIAGNOSIS — C259 Malignant neoplasm of pancreas, unspecified: Secondary | ICD-10-CM

## 2011-10-16 DIAGNOSIS — Z79899 Other long term (current) drug therapy: Secondary | ICD-10-CM | POA: Insufficient documentation

## 2011-10-16 DIAGNOSIS — Z7982 Long term (current) use of aspirin: Secondary | ICD-10-CM | POA: Insufficient documentation

## 2011-10-16 DIAGNOSIS — R5381 Other malaise: Secondary | ICD-10-CM | POA: Insufficient documentation

## 2011-10-16 MED ORDER — FENTANYL CITRATE 0.05 MG/ML IJ SOLN
INTRAMUSCULAR | Status: AC
  Filled 2011-10-16: qty 2

## 2011-10-16 MED ORDER — FENTANYL CITRATE 0.05 MG/ML IJ SOLN
INTRAMUSCULAR | Status: AC | PRN
  Administered 2011-10-16: 100 ug via INTRAVENOUS

## 2011-10-16 MED ORDER — MIDAZOLAM HCL 5 MG/5ML IJ SOLN
INTRAMUSCULAR | Status: AC | PRN
  Administered 2011-10-16: 1 mg via INTRAVENOUS

## 2011-10-16 MED ORDER — LIDOCAINE HCL 1 % IJ SOLN
INTRAMUSCULAR | Status: AC
  Filled 2011-10-16: qty 20

## 2011-10-16 MED ORDER — MIDAZOLAM HCL 2 MG/2ML IJ SOLN
INTRAMUSCULAR | Status: AC
  Filled 2011-10-16: qty 6

## 2011-10-16 MED ORDER — CIPROFLOXACIN IN D5W 400 MG/200ML IV SOLN
400.0000 mg | INTRAVENOUS | Status: AC
  Administered 2011-10-16: 400 mg via INTRAVENOUS
  Filled 2011-10-16: qty 200

## 2011-10-16 MED ORDER — SODIUM CHLORIDE 0.9 % IV SOLN
INTRAVENOUS | Status: DC
  Administered 2011-10-16: 08:00:00 via INTRAVENOUS

## 2011-10-16 MED ORDER — IOHEXOL 300 MG/ML  SOLN: 20.0000 mL | Freq: Once | INTRAMUSCULAR | Status: AC | PRN

## 2011-10-16 MED ORDER — FENTANYL CITRATE 0.05 MG/ML IJ SOLN
INTRAMUSCULAR | Status: AC
  Filled 2011-10-16: qty 4

## 2011-10-16 NOTE — H&P (Signed)
Mitchell Hines is an 71 y.o. male.   Chief Complaint: Pancreatic Ca; obstructive jaundice; Biliary drain placed 9/12 HPI: scheduled for Internalization/Biliary stent placement in IR today  Past Medical History  Diagnosis Date  . Pancreas cancer 2012  . Cholestasis 2012  . Liver metastases 2012  . Lung metastases 2012  . BPH (benign prostatic hyperplasia) 2012    with Dr. Alfredo Martinez  . Pulmonary embolism 2012  . Weight loss, non-intentional 2012  . Poor appetite 2012  . GERD (gastroesophageal reflux disease) 06/29/2011  . Pancreatic insufficiency 06/29/2011  . Hypertension 06/29/2011  . Hemorrhoid 06/29/2011  . Protein calorie malnutrition   . Neutropenia   . UTI (lower urinary tract infection)   . Diabetes mellitus     oral    Past Surgical History  Procedure Date  . Portacath placement 06/10/11 Powerport    Tip in lower SVC per Dr. Lowella Dandy  . Percutaneous external biliary drain placement 06/07/11    Family History  Problem Relation Age of Onset  . Cervical cancer Mother   . Diabetes type II Mother   . Stroke Father   . Pancreatic cancer Brother   . Coronary artery disease Brother    Social History:  reports that he quit smoking about 30 years ago. His smoking use included Cigarettes. He has a 30 pack-year smoking history. He has never used smokeless tobacco. He reports that he does not drink alcohol or use illicit drugs.  Allergies: No Known Allergies  Medications Prior to Admission  Medication Sig Dispense Refill  . aspirin 81 MG chewable tablet Chew 81 mg by mouth daily.        . baclofen (LIORESAL) 10 MG tablet Take 5 mg by mouth 3 (three) times daily. For hiccups      . DULoxetine (CYMBALTA) 30 MG capsule Take 1 capsule (30 mg total) by mouth daily.  30 capsule  5  . feeding supplement (ENSURE CLINICAL STRENGTH) LIQD Take 237 mLs by mouth 3 (three) times daily with meals.  90 Bottle  0  . lipase/protease/amylase (CREON-10/PANCREASE) 12000 UNITS CPEP Take 2  capsules by mouth 3 (three) times daily before meals. Take 1 capsule before meals and if patient plans to eat a fatty food he will take 2 more capsules      . miconazole (MICOTIN) 2 % cream Apply topically 2 (two) times daily. Apply topically 2 (two) times daily. Apply to rash in the groin area.  28.35 g  3  . morphine (ROXANOL) 20 MG/ML concentrated solution Take 5-20 mg by mouth every 2 (two) hours as needed. For pain      . omeprazole (PRILOSEC) 40 MG capsule Take 40 mg by mouth daily.        . ondansetron (ZOFRAN) 8 MG tablet Take 8 mg by mouth every 12 (twelve) hours as needed.        . prochlorperazine (COMPAZINE) 10 MG tablet Take 10 mg by mouth every 6 (six) hours as needed. For nausea      . sitaGLIPtin (JANUVIA) 100 MG tablet Take 100 mg by mouth daily.        . sucralfate (CARAFATE) 1 G tablet Take 1 g by mouth 4 (four) times daily.        Marland Kitchen terazosin (HYTRIN) 5 MG capsule Take 5 mg by mouth at bedtime.         Medications Prior to Admission  Medication Dose Route Frequency Provider Last Rate Last Dose  . 0.9 %  sodium  chloride infusion   Intravenous Continuous D Jeananne Rama, PA      . ciprofloxacin (CIPRO) IVPB 400 mg  400 mg Intravenous to XRAY D Jeananne Rama, PA        No results found for this or any previous visit (from the past 48 hour(s)). No results found.  Review of Systems  Constitutional: Negative for fever and chills.  Respiratory: Negative for cough.   Cardiovascular: Negative for chest pain.  Gastrointestinal: Negative for nausea, vomiting and abdominal pain.  Neurological: Negative for headaches.    Blood pressure 112/77, pulse 103, temperature 97.5 F (36.4 C), temperature source Oral, resp. rate 16, height 6' (1.829 m), weight 144 lb (65.318 kg), SpO2 100.00%. Physical Exam  Constitutional: He is oriented to person, place, and time. He appears well-developed.  HENT:  Head: Normocephalic.  Eyes: EOM are normal.  Neck: Normal range of motion.    Cardiovascular: Normal rate, regular rhythm and normal heart sounds.   No murmur heard. Respiratory: Effort normal and breath sounds normal. He has no wheezes.  GI: Soft. Bowel sounds are normal. There is no tenderness.  Musculoskeletal: Normal range of motion.       Weak; uses walker  Neurological: He is alert and oriented to person, place, and time.  Skin: Skin is warm and dry.     Assessment/Plan Pancreatic Ca with liver and lung mets. Obstructive jaundice; biliary drain placed 9/12 For internalization/stent placement today Pt and family aware of procedure benefits and risks and agreeable to proceed. Consent signed  Tremell Reimers A 10/16/2011, 7:48 AM

## 2011-10-16 NOTE — Procedures (Signed)
Success Wall Flex 10 x 60 CBD stent across obstruction biliar drain removed No comp Stable Full report dictated in PACS

## 2011-10-16 NOTE — H&P (Signed)
Agree with stent placement and tube removal today for malignant biliary obstruction.  nonsurgical candidate

## 2011-10-21 ENCOUNTER — Telehealth: Payer: Self-pay | Admitting: *Deleted

## 2011-10-21 ENCOUNTER — Other Ambulatory Visit: Payer: Self-pay | Admitting: Oncology

## 2011-10-21 ENCOUNTER — Inpatient Hospital Stay (HOSPITAL_COMMUNITY)
Admission: AD | Admit: 2011-10-21 | Discharge: 2011-10-29 | DRG: 435 | Disposition: A | Discharge status: Hospice | Source: Ambulatory Visit | Attending: Oncology | Admitting: Oncology

## 2011-10-21 DIAGNOSIS — C259 Malignant neoplasm of pancreas, unspecified: Principal | ICD-10-CM

## 2011-10-21 DIAGNOSIS — R6251 Failure to thrive (child): Secondary | ICD-10-CM

## 2011-10-21 DIAGNOSIS — K5900 Constipation, unspecified: Secondary | ICD-10-CM | POA: Diagnosis present

## 2011-10-21 DIAGNOSIS — L8992 Pressure ulcer of unspecified site, stage 2: Secondary | ICD-10-CM | POA: Diagnosis present

## 2011-10-21 DIAGNOSIS — K219 Gastro-esophageal reflux disease without esophagitis: Secondary | ICD-10-CM

## 2011-10-21 DIAGNOSIS — F329 Major depressive disorder, single episode, unspecified: Secondary | ICD-10-CM

## 2011-10-21 DIAGNOSIS — Z515 Encounter for palliative care: Secondary | ICD-10-CM

## 2011-10-21 DIAGNOSIS — E119 Type 2 diabetes mellitus without complications: Secondary | ICD-10-CM

## 2011-10-21 DIAGNOSIS — K8689 Other specified diseases of pancreas: Secondary | ICD-10-CM

## 2011-10-21 DIAGNOSIS — L89109 Pressure ulcer of unspecified part of back, unspecified stage: Secondary | ICD-10-CM | POA: Diagnosis present

## 2011-10-21 DIAGNOSIS — E46 Unspecified protein-calorie malnutrition: Secondary | ICD-10-CM

## 2011-10-21 DIAGNOSIS — N400 Enlarged prostate without lower urinary tract symptoms: Secondary | ICD-10-CM

## 2011-10-21 DIAGNOSIS — R627 Adult failure to thrive: Secondary | ICD-10-CM | POA: Diagnosis present

## 2011-10-21 DIAGNOSIS — R634 Abnormal weight loss: Secondary | ICD-10-CM

## 2011-10-21 DIAGNOSIS — R109 Unspecified abdominal pain: Secondary | ICD-10-CM | POA: Diagnosis present

## 2011-10-21 DIAGNOSIS — R5381 Other malaise: Secondary | ICD-10-CM | POA: Diagnosis present

## 2011-10-21 DIAGNOSIS — Z66 Do not resuscitate: Secondary | ICD-10-CM

## 2011-10-21 DIAGNOSIS — I10 Essential (primary) hypertension: Secondary | ICD-10-CM

## 2011-10-21 DIAGNOSIS — N179 Acute kidney failure, unspecified: Secondary | ICD-10-CM

## 2011-10-21 DIAGNOSIS — A419 Sepsis, unspecified organism: Secondary | ICD-10-CM

## 2011-10-21 DIAGNOSIS — R630 Anorexia: Secondary | ICD-10-CM

## 2011-10-21 DIAGNOSIS — E43 Unspecified severe protein-calorie malnutrition: Secondary | ICD-10-CM | POA: Diagnosis present

## 2011-10-21 DIAGNOSIS — D72829 Elevated white blood cell count, unspecified: Secondary | ICD-10-CM

## 2011-10-21 DIAGNOSIS — C7800 Secondary malignant neoplasm of unspecified lung: Secondary | ICD-10-CM

## 2011-10-21 DIAGNOSIS — I2699 Other pulmonary embolism without acute cor pulmonale: Secondary | ICD-10-CM

## 2011-10-21 DIAGNOSIS — D709 Neutropenia, unspecified: Secondary | ICD-10-CM

## 2011-10-21 DIAGNOSIS — F32A Depression, unspecified: Secondary | ICD-10-CM

## 2011-10-21 DIAGNOSIS — R531 Weakness: Secondary | ICD-10-CM

## 2011-10-21 DIAGNOSIS — C787 Secondary malignant neoplasm of liver and intrahepatic bile duct: Secondary | ICD-10-CM

## 2011-10-21 DIAGNOSIS — E860 Dehydration: Secondary | ICD-10-CM

## 2011-10-21 DIAGNOSIS — Z9221 Personal history of antineoplastic chemotherapy: Secondary | ICD-10-CM

## 2011-10-21 DIAGNOSIS — K649 Unspecified hemorrhoids: Secondary | ICD-10-CM

## 2011-10-21 LAB — COMPREHENSIVE METABOLIC PANEL
ALT: 32 U/L (ref 0–53)
Calcium: 9.7 mg/dL (ref 8.4–10.5)
GFR calc Af Amer: 19 mL/min — ABNORMAL LOW (ref >90)
Glucose, Bld: 135 mg/dL — ABNORMAL HIGH (ref 70–99)
Sodium: 145 mEq/L (ref 135–145)
Total Protein: 8.2 g/dL (ref 6.0–8.3)

## 2011-10-21 LAB — CBC
Hemoglobin: 13 g/dL (ref 13.0–17.0)
MCH: 27.5 pg (ref 26.0–34.0)
MCHC: 32.1 g/dL (ref 30.0–36.0)
Platelets: 230 10*3/uL (ref 150–400)
RDW: 14.3 % (ref 11.5–15.5)

## 2011-10-21 MED ORDER — ENOXAPARIN SODIUM 30 MG/0.3ML ~~LOC~~ SOLN
30.0000 mg | SUBCUTANEOUS | Status: DC
  Administered 2011-10-22 – 2011-10-28 (×5): 30 mg via SUBCUTANEOUS
  Filled 2011-10-21 (×9): qty 0.3

## 2011-10-21 MED ORDER — DOCUSATE SODIUM 100 MG PO CAPS
100.0000 mg | ORAL_CAPSULE | Freq: Two times a day (BID) | ORAL | Status: DC | PRN
  Filled 2011-10-21: qty 1

## 2011-10-21 MED ORDER — MICONAZOLE NITRATE 2 % EX CREA
TOPICAL_CREAM | Freq: Two times a day (BID) | CUTANEOUS | Status: DC
  Administered 2011-10-21 – 2011-10-29 (×14): via TOPICAL
  Filled 2011-10-21: qty 14

## 2011-10-21 MED ORDER — MORPHINE SULFATE 2 MG/ML IJ SOLN
2.0000 mg | INTRAMUSCULAR | Status: DC | PRN
  Administered 2011-10-22: 2 mg via INTRAVENOUS
  Filled 2011-10-21: qty 1

## 2011-10-21 MED ORDER — ZOLPIDEM TARTRATE 5 MG PO TABS: 5.0000 mg | ORAL_TABLET | Freq: Every evening | ORAL | Status: DC | PRN

## 2011-10-21 MED ORDER — ENSURE CLINICAL ST REVIGOR PO LIQD
237.0000 mL | Freq: Three times a day (TID) | ORAL | Status: DC
  Administered 2011-10-22: 09:00:00 via ORAL

## 2011-10-21 MED ORDER — MORPHINE SULFATE (CONCENTRATE) 20 MG/ML PO SOLN
5.0000 mg | ORAL | Status: DC | PRN
  Administered 2011-10-26: 10 mg via ORAL
  Administered 2011-10-29: 20 mg via ORAL
  Filled 2011-10-21 (×3): qty 1

## 2011-10-21 MED ORDER — HYDROCODONE-ACETAMINOPHEN 5-325 MG PO TABS: 1.0000 | ORAL_TABLET | Freq: Four times a day (QID) | ORAL | Status: DC | PRN

## 2011-10-21 MED ORDER — BACLOFEN 5 MG HALF TABLET
5.0000 mg | ORAL_TABLET | Freq: Three times a day (TID) | ORAL | Status: DC | PRN
  Administered 2011-10-25: 5 mg via ORAL
  Filled 2011-10-21 (×3): qty 1

## 2011-10-21 MED ORDER — SODIUM CHLORIDE 0.9 % IV SOLN
INTRAVENOUS | Status: DC
  Administered 2011-10-22: 22:00:00 via INTRAVENOUS
  Administered 2011-10-22 (×2): 75 mL via INTRAVENOUS
  Administered 2011-10-24 – 2011-10-26 (×2): via INTRAVENOUS
  Administered 2011-10-27: 50 mL/h via INTRAVENOUS
  Administered 2011-10-29: 05:00:00 via INTRAVENOUS

## 2011-10-21 MED ORDER — ONDANSETRON HCL 4 MG/2ML IJ SOLN
4.0000 mg | Freq: Three times a day (TID) | INTRAMUSCULAR | Status: DC | PRN
  Administered 2011-10-23 – 2011-10-26 (×6): 4 mg via INTRAVENOUS
  Filled 2011-10-21 (×6): qty 2

## 2011-10-21 NOTE — Telephone Encounter (Signed)
Message rec'd from Triage RN pt wants to be directly admitted to Palliative Care Unit.  He is being offered admission to [facility], but does not want to go to there according to Hospice RN. Informed Dr. Gaylyn Rong and he instructs for pt to either go to [facility] and go to Emergency Room.  Called Hospice RN, Cordelia Pen, back w/ above instructions.  She will tell pt.Mitchell Hines

## 2011-10-21 NOTE — Telephone Encounter (Signed)
PT. IS NOT EATING OR DRINKING. HE HAS HAD NAUSEA AND VOMITING OVER THE WEEKEND. PT. IS ALERT BUT SLOW TO RESPOND  HE REFUSES [facility]. PT WANTS TO GO TO THE HOSPITAL. WOULD DR.HA DO A DIRECT ADMIT TO THE PALLIATIVE CARE UNIT. THIS NOTE TO DR.HA'S NURSE, CAMEO PORTER,RN.

## 2011-10-21 NOTE — Telephone Encounter (Signed)
Called Hospice RN back to inform that Dr. Gaylyn Rong agreed to directly admit pt to Palliative Care Unit.   Called Pt Placement and they instructed for pt to present at ED Registration and bed is available.  Informed Cordelia Pen and she will call report to PCU and they are to page Dr. Gaylyn Rong for orders once pt is admitted.

## 2011-10-21 NOTE — H&P (Signed)
HPI:   Mitchell Hines is a 71 year-old man with history of metastatic pancreatic cancer with met to the liver.  He received chemotherapy FOLFOX x 3 cycles with the last one in November 2012.  He had partial response with chemotherapy; however, he developed severe fatigue, deconditioning ECOG 3; anorexia, weight loss.  Thus, I recommended hospice.  Since then, despite being off of chemo, his general condition has worsened.  His wife said that he spend almost all awake time in bed.  He is not independent of activities of daily living.  He wear diapers and his wife changes three times daily.  She has to go to work; and so she changes before and after work and during her break when she comes home.  He has not had any appetite and drink at most 1 can of Ensure daily and some water.  For the past few days, his wife has noticed that he has turned for the worse with very little interaction.  He had some nausea/vomiting today which resolved with Zofran.  She is not able to take care of him at home and is concerned about his generalized deconditioning.  He has refused to be admitted to [facility].    He denies fever, Davon Folta, mucositis, SOB, CP, abdominal swelling.  Ever since he had his biliary tube internalized; he has not had any jaundice.   He does have some intermittent back pain and abdominal pain which improves with Roxanol.  He has very little bowel movement and urine output.  They denies any visible source of bleeding.              Past Medical History  Diagnosis Date  . Pancreas cancer 2012  . Cholestasis 2012  . Liver metastases 2012  . Lung metastases 2012  . BPH (benign prostatic hyperplasia) 2012    with Dr. Alfredo Martinez  . Pulmonary embolism 2012  . Weight loss, non-intentional 2012  . Poor appetite 2012  . GERD (gastroesophageal reflux disease) 06/29/2011  . Pancreatic insufficiency 06/29/2011  . Hypertension 06/29/2011  . Hemorrhoid 06/29/2011  . Protein calorie malnutrition   . Neutropenia    . UTI (lower urinary tract infection)   . Diabetes mellitus     oral   Past Surgical History  Procedure Date  . Portacath placement 06/10/11 Powerport    Tip in lower SVC per Dr. Lowella Dandy  . Percutaneous external biliary drain placement 06/07/11   Family History  Problem Relation Age of Onset  . Cervical cancer Mother   . Diabetes type II Mother   . Stroke Father   . Pancreatic cancer Brother   . Coronary artery disease Brother    Social History:  reports that he quit smoking about 30 years ago. His smoking use included Cigarettes. He has a 30 pack-year smoking history. He has never used smokeless tobacco. He reports that he does not drink alcohol or use illicit drugs.  Allergies: No Known Allergies Medications Prior to Admission  Medication Dose Route Frequency Provider Last Rate Last Dose  . 0.9 %  sodium chloride infusion   Intravenous Continuous Jethro Bolus, MD      . docusate sodium (COLACE) capsule 100 mg  100 mg Oral BID PRN Jethro Bolus, MD      . enoxaparin (LOVENOX) injection 30 mg  30 mg Subcutaneous Q24H Jethro Bolus, MD      . HYDROcodone-acetaminophen (NORCO) 5-325 MG per tablet 1-2 tablet  1-2 tablet Oral Q6H PRN Jethro Bolus, MD      .  morphine 2 MG/ML injection 2 mg  2 mg Intravenous Q4H PRN Jethro Bolus, MD      . zolpidem (AMBIEN) tablet 5 mg  5 mg Oral QHS PRN Jethro Bolus, MD       Medications Prior to Admission  Medication Sig Dispense Refill  . aspirin 81 MG chewable tablet Chew 81 mg by mouth daily.        . baclofen (LIORESAL) 10 MG tablet Take 5 mg by mouth 3 (three) times daily. For hiccups      . DULoxetine (CYMBALTA) 30 MG capsule Take 1 capsule (30 mg total) by mouth daily.  30 capsule  5  . feeding supplement (ENSURE CLINICAL STRENGTH) LIQD Take 237 mLs by mouth 3 (three) times daily with meals.  90 Bottle  0  . lipase/protease/amylase (CREON-10/PANCREASE) 12000 UNITS CPEP Take 2 capsules by mouth 3 (three) times daily before meals. Take 1 capsule before meals and if patient plans  to eat a fatty food he will take 2 more capsules      . miconazole (MICOTIN) 2 % cream Apply topically 2 (two) times daily. Apply topically 2 (two) times daily. Apply to rash in the groin area.  28.35 g  3  . morphine (ROXANOL) 20 MG/ML concentrated solution Take 5-20 mg by mouth every 2 (two) hours as needed. For pain      . omeprazole (PRILOSEC) 40 MG capsule Take 40 mg by mouth daily.        . ondansetron (ZOFRAN) 8 MG tablet Take 8 mg by mouth every 12 (twelve) hours as needed.        . prochlorperazine (COMPAZINE) 10 MG tablet Take 10 mg by mouth every 6 (six) hours as needed. For nausea      . sitaGLIPtin (JANUVIA) 100 MG tablet Take 100 mg by mouth daily.        . sucralfate (CARAFATE) 1 G tablet Take 1 g by mouth 4 (four) times daily.        Marland Kitchen terazosin (HYTRIN) 5 MG capsule Take 5 mg by mouth at bedtime.         No results found for this or any previous visit (from the past 48 hour(s)).  Pertinent items are noted in HPI.   Blood pressure 130/96, pulse 107, temperature 98.2 F (36.8 C), resp. rate 16, height 6\' 1"  (1.854 m), weight 143 lb 8.3 oz (65.1 kg), SpO2 97.00%.  ECOG 3 PHYSICAL EXAM:    General: Thin, cachetically appearing male in no acute distress. Eyes: No icterus. ENT: There were no oropharyngeal lesions. Neck was without thyromegaly. Lymphatics: Negative cervical, supraclavicular or axillary adenopathy. Respiratory: lungs were clear bilaterally without wheezing or crackles. Cardiovascular: Regular rate and rhythm, S1/S2, without murmur, rub or gallop. There was no pedal edema. GI: abdomen was soft, flat, nontender, nondistended, without organomegaly.  Muscoloskeletal: no spinal tenderness of palpation of vertebral spine. Skin exam was without echymosis, petichae. There were several stage I decubitus ulcers.  Neuro exam was nonfocal. He could not sit up in bed on his own. Gait was not able to assessed due to his deconditioning. Patient was alerted to name and place only.  Attention was good. Language was appropriate. Mood was flat affect. Speech was not pressured. Thought content was not tangential.   Assessment  1.  Metastatic pancreas cancer with met to liver. 2.  Extremely poor performance status.   3.  Severe calorie protein malnutrition. 4.  Extreme deconditioning. 5.  Hypertension. 6.  Pancreatic insufficiency.  7.  Diabetes mellitus, type II. 8.  Decubitus ulcer.   He is actively dying from his disease progression.  He has been in no performance status sufficient to tolerate chemotherapy.  There is role for cancer-directed therapy.  I discussed with Mitchell Hines, his wife, his daughter, and sister-in-law and brother-in-law that the purpose of this admission is comfort care only with transition to long-term-care facility if he seems to survive the next few days.  I will check his CBC, CMET to make sure that there is no infection and that his bilirubin is still normalized from recent bilary drainage internalization.  As he is not eating/drinking much, I will start gentle IVF for hydration. I will simplify his home medication regimen and will only keep medications that are vital to his comfort care.  I will reserve oral Roxanol for moderate pain and IV morphine for more severe pain.  If he is more uncomfortable, I may consider morphine IV gtt the next few days.  I will continue to recommend [facility] the next few days depending on his clinical status.  I recommended wound care consult for decubitus ulcer management.    He is DNR/DNI as previously discussed.   Abbigaile Rockman 10/21/2011, 8:05 PM

## 2011-10-22 ENCOUNTER — Inpatient Hospital Stay (HOSPITAL_COMMUNITY)

## 2011-10-22 DIAGNOSIS — D473 Essential (hemorrhagic) thrombocythemia: Secondary | ICD-10-CM

## 2011-10-22 DIAGNOSIS — N179 Acute kidney failure, unspecified: Secondary | ICD-10-CM

## 2011-10-22 LAB — URINALYSIS, ROUTINE W REFLEX MICROSCOPIC
Leukocytes, UA: NEGATIVE
Nitrite: NEGATIVE
Specific Gravity, Urine: 1.019 (ref 1.005–1.030)
pH: 5.5 (ref 5.0–8.0)

## 2011-10-22 LAB — URINE MICROSCOPIC-ADD ON

## 2011-10-22 MED ORDER — MORPHINE SULFATE 2 MG/ML IJ SOLN
INTRAMUSCULAR | Status: AC
  Administered 2011-10-22: 2 mg via INTRAVENOUS
  Filled 2011-10-22: qty 1

## 2011-10-22 MED ORDER — MORPHINE SULFATE 2 MG/ML IJ SOLN
2.0000 mg | INTRAMUSCULAR | Status: DC | PRN
  Administered 2011-10-22 – 2011-10-29 (×35): 2 mg via INTRAVENOUS
  Filled 2011-10-22 (×37): qty 1

## 2011-10-22 NOTE — Progress Notes (Signed)
[facility] INPATIENT PROGRESS NOTE  Name: Mitchell Hines      MRN: [id number]    Location: 1327/1327-01  Date: 10/22/2011 Time:1:26 PM   Subjective: Interval History:Mitchell Hines had back pain last night requiring morphine frequently.  His back pain has improved since then.  He has not had nausea/vomiting since admission.  He still has no appetite and does not want to eat or drink much.  He denies fever, head ache, cough, SOB, abdominal pain, bleeding symptoms.     Objective: Vital signs in last 24 hours: Temp:  [98.2 F (36.8 C)-98.6 F (37 C)] 98.6 F (37 C) (02/12 0550) Pulse Rate:  [106-107] 106  (02/12 0550) Resp:  [12-16] 12  (02/12 0550) BP: (102-130)/(68-96) 102/68 mmHg (02/12 0550) SpO2:  [97 %] 97 % (02/12 0550) Weight:  [143 lb 8.3 oz (65.1 kg)] 143 lb 8.3 oz (65.1 kg) (02/11 1745)     PHYSICAL EXAM: General: Thin, cachetically appearing male in no acute distress. Eyes: No icterus. ENT: There were no oropharyngeal lesions. Neck was without thyromegaly. Lymphatics: Negative cervical, supraclavicular or axillary adenopathy. Respiratory: lungs were clear bilaterally without wheezing or crackles. Cardiovascular: Regular rate and rhythm, S1/S2, without murmur, rub or gallop. There was no pedal edema. GI: abdomen was soft, flat, nontender, nondistended, without organomegaly. Muscoloskeletal: no spinal tenderness of palpation of vertebral spine. Skin exam was without echymosis, petichae. Patient was alert and was able to answer simple questions.    Studies/Results: Results for orders placed during the hospital encounter of 10/21/11 (from the past 48 hour(s))  CBC     Status: Abnormal   Collection Time   10/21/11  9:51 PM      Component Value Range Comment   WBC 18.2 (*) 4.0 - 10.5 (K/uL)    RBC 4.73  4.22 - 5.81 (MIL/uL)    Hemoglobin 13.0  13.0 - 17.0 (g/dL)    HCT 40.9  81.1 - 91.4 (%)    MCV 85.6  78.0 - 100.0 (fL)    MCH 27.5  26.0 - 34.0 (pg)    MCHC 32.1   30.0 - 36.0 (g/dL)    RDW 78.2  95.6 - 21.3 (%)    Platelets 230  150 - 400 (K/uL)   COMPREHENSIVE METABOLIC PANEL     Status: Abnormal   Collection Time   10/21/11  9:51 PM      Component Value Range Comment   Sodium 145  135 - 145 (mEq/L)    Potassium 3.3 (*) 3.5 - 5.1 (mEq/L)    Chloride 98  96 - 112 (mEq/L)    CO2 27  19 - 32 (mEq/L)    Glucose, Bld 135 (*) 70 - 99 (mg/dL)    BUN 086 (*) 6 - 23 (mg/dL)    Creatinine, Ser 5.78 (*) 0.50 - 1.35 (mg/dL)    Calcium 9.7  8.4 - 10.5 (mg/dL)    Total Protein 8.2  6.0 - 8.3 (g/dL)    Albumin 2.9 (*) 3.5 - 5.2 (g/dL)    AST 35  0 - 37 (U/L)    ALT 32  0 - 53 (U/L)    Alkaline Phosphatase 219 (*) 39 - 117 (U/L)    Total Bilirubin 0.7  0.3 - 1.2 (mg/dL)    GFR calc non Af Amer 16 (*) >90 (mL/min)    GFR calc Af Amer 19 (*) >90 (mL/min)    Dg Chest 1 View  10/22/2011  *RADIOLOGY REPORT*  Clinical Data: Pancreatic cancer,  leukocytosis, weakness, occasional shortness of breath  CHEST - 1 VIEW  Comparison: 08/09/2000  Findings: Rotated to the left and lordotic positioning. Right jugular power port, tip projecting over SVC. Normal heart size, mediastinal contours, and pulmonary vascularity. Lungs clear. No pleural effusion or pneumothorax. Bones diffusely demineralized.  IMPRESSION: No acute abnormalities.  Original Report Authenticated By: Lollie Marrow, M.D.     MEDICATIONS: reviewed.     Assessment/Plan:   1. Metastatic pancreas cancer with met to liver: he had 3 cycles of chemo. Last given in November 2012.  He had partial response but had severe deconditioning and had to stop further therapy.  2. Extremely poor performance status.  3. Severe calorie protein malnutrition:  No response to multiple lines of appetite stimulant in the past.  This is due to cancer-cachexia.  4. Extreme deconditioning.  5. Hypertension.  6. Pancreatic insufficiency.  7. Diabetes mellitus, type II.  8. Decubitus ulcer; stage II:  Appreciate eval by wound  care.  He has air mattress overlay and wound dressing, and foam dressing.  9.  Acute renal failure:  Due to poor PO intake.  He's on gentle IVF.  10.Leukocytosis:  Due to his progressing disease most likely.  CXR was negative for infiltrate.  UA pending.  11.  Dispo:  I again explained to patient's family that his disease is endstage.  He looks comfortable today.  I strongly recommended [facility] placement.  Patient and family were amendable to this today.  The goal of this admission is transition to comfort care only.  Thus, I will not push him to eat, physical therapy or controlling his DM tightly.   DNR/DNI as previously discussed.

## 2011-10-22 NOTE — Consult Note (Signed)
WOC consult Note Reason for Consult: Consult requested for sacral wound. Family member at bedside, states wound has been there prior to admission.  Pt very emaciated with protruding sacrum bone.  Foam dressing and air mattress has been ordered. Wound type: Stage 2 in two areas to sacrum Pressure Ulcer POA: Yes Measurement: .2X.2X.1cm, .1X.1X.1cm Wound bed: pink and dry Drainage (amount, consistency, odor) scant Dressing procedure/placement/frequency: Foam dressing to protect and promote healing.  This is consistent with comfort care goals. Will not plan to follow further unless re-consulted.  [address] RN, MSN, [employer]  [phone]

## 2011-10-22 NOTE — Progress Notes (Signed)
Room  1327  Oneal Deputy.  [facility]  [facility] RN Visit  Related admission to [facility] diagnosis of pancreatic cancer.  Pt is DNR code w/OOF DNR on shadow chart.  Pt arousable & oriented, lying in bed, appears comfortable and denies any pain or discomfort.   Wife present - gave her Blue book-Gone From My Sight & she was most grateful.  Discussed with wife about the patient's family that want to visit - dtr in [place] and brother out of town.   Offered support.  Wife's brother & mother & dtr came following visit.   Please call [facility] @ [phone] with any needs.  Thank you.  Joneen Boers, RN  [facility]  Hospice Liaison

## 2011-10-22 NOTE — Consult Note (Signed)
[facility] Liaison: Received request from Dr. Gaylyn Rong to follow up with family related to [facility]. Spoke with [facility] SW and reviewed chart. Understand from [facility] SW family declined room at [facility] yesterday. Met with spouse outside room. Family aware they are welcome to tour [facility] at anytime. Showed spouse [employer] show. Spouse aware no [facility] availability at this time. She is working hard on end of life tasks. [facility] SW aware of above and will follow. I am available as needed. Thank you. Forrestine Him LCSW [phone]

## 2011-10-22 NOTE — Progress Notes (Signed)
Palliative Medicine Team received consult for [facility] placement, pt is current [facility] patient and [facility] RN Liaison, [employer] and CSW are aware and are following to address this request.  No needs from PMT at this time; will sign off-please re-consult in future if PMT can be of assistance.   Valente David, RN 10/22/2011, 2:20 PM Palliative Medicine Team RN Liaison [phone]

## 2011-10-22 NOTE — Progress Notes (Signed)
CARE MANAGEMENT NOTE 10/22/2011  Patient:  Mitchell Hines, Mitchell Hines   Account Number:  [id number]  Date Initiated:  10/22/2011  Documentation initiated by:  Christiane Sistare  Subjective/Objective Assessment:   71 yo male ith metastatic admitted 10/21/11 with metastatic pancreatic CA     Action/Plan:   D/C when medically stable   Anticipated DC Date:  10/25/2011   Anticipated DC Plan:  [facility]  In-house referral  Clinical Social Worker      DC Planning Services  CM consult                  Status of service:  Completed, signed off    Comments:  10/22/11, Kathi Der RNC-MNN, BSN, [phone].  CM received referral for DME-air overlay mattress for pt.  CM contacted pt's nurse, Noreene Larsson, to make her aware of order for pt's bed.

## 2011-10-23 LAB — URINE CULTURE: Culture  Setup Time: 201302130218

## 2011-10-23 MED ORDER — LORAZEPAM 2 MG/ML IJ SOLN
0.5000 mg | INTRAMUSCULAR | Status: DC | PRN
  Administered 2011-10-23 – 2011-10-29 (×10): 0.5 mg via INTRAVENOUS
  Filled 2011-10-23 (×10): qty 1

## 2011-10-23 MED ORDER — PROCHLORPERAZINE EDISYLATE 5 MG/ML IJ SOLN
10.0000 mg | Freq: Four times a day (QID) | INTRAMUSCULAR | Status: DC | PRN
  Administered 2011-10-24 – 2011-10-26 (×5): 10 mg via INTRAVENOUS
  Filled 2011-10-23 (×6): qty 2

## 2011-10-23 NOTE — Progress Notes (Signed)
[facility] INPATIENT PROGRESS NOTE  Name: Mitchell Hines      MRN: [id number]    Location: 1327/1327-01  Date: 10/23/2011 Time:5:36 PM   Subjective: Interval History:Mitchell Hines reported doing relatively well.  He does not have much pain.  He has no appetite and has not been eating or drinking much.  He has some congestion with phlegm in his sternum area but no SOB, chest pain, fever, hemoptysis.  He denies abdominal pain, back pain today.     Objective: Vital signs in last 24 hours: Temp:  [98 F (36.7 C)] 98 F (36.7 C) (02/13 0235) Pulse Rate:  [96] 96  (02/13 0235) Resp:  [16] 16  (02/13 0235) BP: (101)/(65) 101/65 mmHg (02/13 0235) SpO2:  [97 %] 97 % (02/13 0235)     PHYSICAL EXAM: General: Thin, cachetically appearing male in no acute distress. Eyes: No icterus. ENT: There were no oropharyngeal lesions. Neck was without thyromegaly. Lymphatics: Negative cervical, supraclavicular or axillary adenopathy. Respiratory: lungs were clear bilaterally without wheezing or crackles. Cardiovascular: Regular rate and rhythm, S1/S2, without murmur, rub or gallop. There was no pedal edema. GI: abdomen was soft, flat, nontender, nondistended, without organomegaly. Muscoloskeletal: no spinal tenderness of palpation of vertebral spine. Skin exam was without echymosis, petichae. Patient was alert and was able to answer simple questions.  MEDICATIONS: reviewed.     Assessment/Plan:   1. Metastatic pancreas cancer with met to liver: he had 3 cycles of chemo. Last given in November 2012.  He had partial response but had severe deconditioning and had to stop further therapy.  2. Extremely poor performance status.  3. Severe calorie protein malnutrition:  No response to multiple lines of appetite stimulant in the past.  This is due to cancer-cachexia.  4. Extreme deconditioning.  5. Hypertension.  6. Pancreatic insufficiency.  7. Diabetes mellitus, type II.  8. Decubitus ulcer; stage  II:  Appreciate eval by wound care.  He has air mattress overlay and wound dressing, and foam dressing.  9.  Acute renal failure:  Due to poor PO intake.  He's on gentle IVF.  10.Leukocytosis:  Due to his progressing disease most likely.  CXR was negative for infiltrate.  UA pending.  11.  Dispo: He appears comfortable today. Will continue with pain med prn.  There is no need for morphine IV gtt today.   His wife and daughter were in the room.  I asked them if they needed anything but they did not.  Patient to remain inpatient status until [facility] bed available.    DNR/DNI as previously discussed.

## 2011-10-23 NOTE — Progress Notes (Signed)
Rm 1327 [facility]     [facility] SW note:  Pt awakened to name and greeted SW. Pt's dtr present. She contacted pt's spouse (her mother) via phone to inform of Sw's visit.Pt denied pain and voiced no immediate needs. Inquired if dtr had any concerns or needs at this time. She voiced none. Aware of the request from MD regarding BP placement. Pt and spouse were offered a BP bed on the day of pt's hospitalization. Pt declined the bed and preferred to be hospitalized. Spouse desires to honor pt's wishes until he is no longer alert and aware of his surroundings. BP liasion Candace Gallus. has also spoken with pt's spouse and spouse is at present open to BP placement next week if pt survives to next week.  BP RN director Robet Leu is aware of pt and has informed that there are no beds available at this time. Exie Parody Shon Hough, LCSWA

## 2011-10-23 NOTE — Progress Notes (Signed)
Room  1327 [place]   [facility]  Hosp[facility] & Palliative Care of [facility] RN Visit  Related admission to [facility] diagnosis of metastatic pancreatic cancer.  Pt is DNR code.    Pt sleeping soundly with RR timed @ 8/min.  Appears comfortable.  Wife and dtr present.  Offered support to wife.  Please call [facility] @ [phone] with any needs.  Thank you.  Joneen Boers, RN  [facility]  Hospice Liaison

## 2011-10-24 MED ORDER — MINERAL OIL RE ENEM
1.0000 | ENEMA | Freq: Once | RECTAL | Status: AC
  Administered 2011-10-24: 1 via RECTAL
  Filled 2011-10-24: qty 1

## 2011-10-24 MED ORDER — LACTULOSE 10 GM/15ML PO SOLN
20.0000 g | Freq: Once | ORAL | Status: AC | PRN
  Filled 2011-10-24: qty 30

## 2011-10-24 NOTE — Progress Notes (Signed)
Room 1327  Mitchell Hines  [facility]  Hosp[facility] & Palliative [facility] of [facility] RN Visit  Related admission to [facility] diagnosis of pancreatic cancer.  Pt is DNR code.    Pt more alert today than in previous visits, eyes open, responded to questions, oriented, lying in bed, with no complaints of pain or discomfort.    Wife present and sponging patient's left eye and forehead with damp cloth.  Dtr also present.  No needs at this time.  Per note by Dr. Gaylyn Rong, pt to remain in PCU until BP bed opens up next week.  Please call [facility] @ [phone] with any needs.  Thank you.  Joneen Boers, RN  [facility]  Hospice Liaison

## 2011-10-24 NOTE — Progress Notes (Signed)
[facility] INPATIENT PROGRESS NOTE  Name: Mitchell Hines      MRN: [id number]    Location: 1327/1327-01  Date: 10/24/2011 Time:12:52 PM   Subjective: Interval History:Mitchell Hines reported feeling bloated in the abdomen.  He has not had a bowel movement for more than a week.  He has back pain which responds with morphine.  However, after a morphine dose today, he has been throwing up x2 which has not improved with Compazine or Zofran.  He feels a tightness sensation in the middle of his lower sternum without discreet pain.  He was able to eat some apple sauce and cereal today but very small amount.     Objective: Vital signs in last 24 hours: Temp:  [97.9 F (36.6 C)] 97.9 F (36.6 C) (02/14 0517) Pulse Rate:  [80] 80  (02/14 0517) Resp:  [16] 16  (02/14 0517) BP: (101)/(63) 101/63 mmHg (02/14 0517) SpO2:  [99 %] 99 % (02/14 0517)     PHYSICAL EXAM: General: Thin, cachetically appearing male in no acute distress. Eyes: No icterus. ENT: There were no oropharyngeal lesions. Neck was without thyromegaly. Lymphatics: Negative cervical, supraclavicular or axillary adenopathy. Respiratory: lungs were clear bilaterally without wheezing or crackles. Cardiovascular: Regular rate and rhythm, S1/S2, without murmur, rub or gallop. There was no pedal edema. GI: abdomen was soft, flat, nontender, nondistended, without organomegaly. Muscoloskeletal: no spinal tenderness of palpation of vertebral spine or his sternum.  Skin exam was without echymosis, petichae. Patient was alert and was able to answer simple questions.   MEDICATIONS: reviewed.     Assessment/Plan:  1. Metastatic pancreas cancer with met to liver: he had 3 cycles of FOLFOX. Last given in November 2012.  He had partial response but had severe deconditioning and had to stop further therapy.  2. Extremely poor performance status.  3. Severe calorie protein malnutrition:  No response to multiple lines of appetite stimulant in the  past.  This is due to cancer-cachexia.  4. Extreme deconditioning.  5. Hypertension.  6. Pancreatic insufficiency.  7. Diabetes mellitus, type II.  8. Decubitus ulcer; stage II.  10.Leukocytosis:  Due to his progressing disease most likely.  CXR was negative for infiltrate.  UA was negative.  11.  Dispo: He appears comfortable today. Will give fleet enema and lactulose for obstipation.  Will continue with compazine and Zofran prn.  Will continue with morphine IV prn pain.  Will decrease fluid rate down to 63ml/hr as patient's wife thinks that he is "bloated" from the high rate IVF but still wanted IVF due to presenting acute renal failure.  Mitchell Hines today would like to wait to for a Mitchell Hines bed to open.  He still have doubt about going there.  However, I expressed my strong reservation about going home since he was admitted to the hospital this admission for failure to thrive at home.   DNR/DNI as previously discussed.

## 2011-10-24 NOTE — Progress Notes (Signed)
[facility])  Chaplain Note    Hadi Dubin  1327  Hospice homecare chaplain visited patient (pt) to assess for spiritual needs.   Pt awake on chaplain's entry, nodding in greeting.  Pt's wife and daughter present first in room and then in hallway with chaplain.  Chaplain provided spiritual support through pastoral presence and touch.  Chaplain will follow.  Beverly Isley-Landreth ThM, BCCC [facility] Chaplain

## 2011-10-25 DIAGNOSIS — C787 Secondary malignant neoplasm of liver and intrahepatic bile duct: Secondary | ICD-10-CM

## 2011-10-25 DIAGNOSIS — D72829 Elevated white blood cell count, unspecified: Secondary | ICD-10-CM

## 2011-10-25 DIAGNOSIS — C259 Malignant neoplasm of pancreas, unspecified: Principal | ICD-10-CM

## 2011-10-25 DIAGNOSIS — R112 Nausea with vomiting, unspecified: Secondary | ICD-10-CM

## 2011-10-25 MED ORDER — DOCUSATE SODIUM 100 MG PO CAPS
100.0000 mg | ORAL_CAPSULE | Freq: Two times a day (BID) | ORAL | Status: DC
  Administered 2011-10-25 – 2011-10-26 (×4): 100 mg via ORAL
  Filled 2011-10-25 (×10): qty 1

## 2011-10-25 MED ORDER — POLYETHYLENE GLYCOL 3350 17 G PO PACK
17.0000 g | PACK | Freq: Every day | ORAL | Status: DC
  Administered 2011-10-26: 17 g via ORAL
  Filled 2011-10-25 (×5): qty 1

## 2011-10-25 MED ORDER — METOCLOPRAMIDE HCL 10 MG PO TABS
10.0000 mg | ORAL_TABLET | Freq: Three times a day (TID) | ORAL | Status: DC
  Administered 2011-10-25 – 2011-10-26 (×4): 10 mg via ORAL
  Filled 2011-10-25 (×10): qty 1

## 2011-10-25 NOTE — Progress Notes (Signed)
Pt experiencing nausea/vomitting so was minimally engaging. His dtr Heavin was present. She expressed reservations about pt going to [facility] as she feels [facility] symbolizes death to pt. However, she will honor whatever her parents decide. Spouse arrived shortly after the visit with pt and dtr. Explained that a bed will most likely become available early next week. Explained that [facility] is not a prison and does not have to be the last stop. They can still decide on other care arrangements after pt accepts a bed. One of the primary concerns is whether or not pt will have IV fluids while at [facility]. This is something that they would like to discuss with MD. Explained that medications and tx can be clarified by MD. America Brown

## 2011-10-25 NOTE — Progress Notes (Signed)
Room 1327  Mose Colaizzi  [facility]  Hosp[facility] & Pall[facility] of [facility] RN Visit  Related admission to [facility] diagnosis of pancreatic cancer.   Pt is DNR code.    Pt arousable & oriented, lying in bed, with no complaints of pain or discomfort.  Recently medicated for nausea.  Wife and dtr present.  Please call [facility] @ [phone] with any needs.  Thank you.  Joneen Boers, RN  [facility]  Hospice Liaison

## 2011-10-25 NOTE — Progress Notes (Signed)
[facility] INPATIENT PROGRESS NOTE  Name: Mitchell Hines      MRN: [id number]    Location: 1327/1327-01  Date: 10/25/2011 Time:8:14 AM   Subjective: The patient is supine in bed, alert, exhausted; wife and daughter in room. Patient tells me his pain is now 3/10. Had BM yesterday. Still nauseated, vomited x2. Denies SOB, cough, phlegm, CP/P, dysuria. Agrees to Foley   Objective: Vital signs in last 24 hours:     Filed Vitals:   10/24/11 0517  BP: 101/63  Pulse: 80  Temp: 97.9 F (36.6 C)  Resp: 16     PHYSICAL EXAM: Cachectic AA male who appears exhausted. Oropharynx shows no thrush. Lungs no rales or rhomchi. Heart RRR. Abd shows diminished BS, NT.    MEDICATIONS: reviewed.       . enoxaparin  30 mg Subcutaneous Q24H  . feeding supplement  237 mL Oral TID WC  . miconazole   Topical BID  . mineral oil  1 enema Rectal Once    Assessment:  1. Metastatic pancreas cancer with met to liver: he had 3 cycles of FOLFOX. Last given in November 2012.  He had partial response but had severe deconditioning and had to stop further therapy.  2. Extremely poor performance status.  3. Severe calorie protein malnutrition:  No response to multiple lines of appetite stimulant in the past. . 4. Extreme deconditioning.  5. Hypertension.  6. Pancreatic insufficiency.  7. Diabetes mellitus, type II.  8. Decubitus ulcer; stage II.  10.Leukocytosis:  Due to his progressing disease most likely.  .  11.DNR/DNI as previously discussed.    Plan: will optimize bowel prophylaxis and pain medications; will place foley; wife requests adult diapers; Decision regarding transfer to [facility] to be reviewed by Dr Gaylyn Rong when he returns 2/18

## 2011-10-26 DIAGNOSIS — L8992 Pressure ulcer of unspecified site, stage 2: Secondary | ICD-10-CM

## 2011-10-26 DIAGNOSIS — L8990 Pressure ulcer of unspecified site, unspecified stage: Secondary | ICD-10-CM

## 2011-10-26 MED ORDER — FENTANYL 12 MCG/HR TD PT72
12.5000 ug | MEDICATED_PATCH | TRANSDERMAL | Status: DC
  Administered 2011-10-26 – 2011-10-29 (×2): 12.5 ug via TRANSDERMAL
  Filled 2011-10-26 (×2): qty 1

## 2011-10-26 NOTE — Progress Notes (Addendum)
Room 1327                              [facility] Hospice and Palliative [facility] of [place], [place] note  Related admission to pancreatic ca. Pt is DNR. Pt is obtunded, wife and daughter are at the bedside. He appears very comfortable. VS: 98.5, rr: 16 easy, sats 100% 109/76,HR: 87 reg. No new labs today, the plan is for a decision to be made on dispisiition after Dr. Gaylyn Rong returns on 2/18. Wife is still uncertain about [facility]. Dimas Millin, RN [facility]

## 2011-10-26 NOTE — Progress Notes (Signed)
[facility] INPATIENT PROGRESS NOTE  Name: Mitchell Hines      MRN: [id number]    Location: 1327/1327-01  Date: 10/26/2011 Time:10:45 AM   Subjective: The patient is asleep in bed, family in room. Awakens easily to voice and touch. Currently pain is controlled, but asked what is his worst problem, it's the pain. Denies N/V, now on RTC anti-emetics.Family had no questions or concerns  Objective: Vital signs in last 24 hours: Temp:  [97.6 F (36.4 C)] 97.6 F (36.4 C) (02/16 0600) Pulse Rate:  [87] 87  (02/16 0600) Resp:  [16] 16  (02/16 0600) BP: (109)/(74) 109/74 mmHg (02/16 0600) SpO2:  [100 %] 100 % (02/16 0600)                     CBG (last 3)  No results found for this basename: GLUCAP:3 in the last 72 hours   PHYSICAL EXAM: Cachectic AA male . Oropharynx shows no thrush.  Lungs no rales or rhomchi.  Heart RRR.  Abd shows diminished BS, NT.   Neuro: answers questions appropriately; nonfocal  MEDICATIONS: reviewed.       . docusate sodium  100 mg Oral BID  . enoxaparin  30 mg Subcutaneous Q24H  . feeding supplement  237 mL Oral TID WC  . metoCLOPramide  10 mg Oral TID AC & HS  . miconazole   Topical BID  . polyethylene glycol  17 g Oral Daily    Assessment: [name] with  1. Metastatic pancreas cancer with met to liver: s/p had 3 cycles of FOLFOX, last given in November 2012.  He had partial response but had severe deconditioning and had to stop further therapy.  2. Extremely poor performance status.  3. Severe calorie protein malnutrition:  No response to multiple lines of appetite stimulant in the past. . 4. Extreme deconditioning.  5. Hypertension.  6. Pancreatic insufficiency.  7. Diabetes mellitus, type II.  8. Decubitus ulcer; stage II.  10.Leukocytosis:  Due to his progressing disease most likely.  .  11.DNR/DNI as previously discussed.    Plan: less nausea; tolerating foley; continuing need for pain medications; will  start duragesic for better long-term control  Decision regarding transfer to [facility] to be reviewed by Dr Gaylyn Rong when he returns 2/18

## 2011-10-27 DIAGNOSIS — E43 Unspecified severe protein-calorie malnutrition: Secondary | ICD-10-CM

## 2011-10-27 MED ORDER — METOCLOPRAMIDE HCL 5 MG/ML IJ SOLN
5.0000 mg | Freq: Three times a day (TID) | INTRAMUSCULAR | Status: DC
  Administered 2011-10-27 – 2011-10-29 (×7): 5 mg via INTRAVENOUS
  Filled 2011-10-27 (×9): qty 1

## 2011-10-27 NOTE — Progress Notes (Signed)
  [facility] INPATIENT PROGRESS NOTE  Name: Jaryn Rosko      MRN: [id number]    Location: 1327/1327-01  Date: 10/27/2011 Time:9:31 AM   Subjective: The patient is supine in bed, eyes closed. Awakens easily to voice and touch. Tells me pain if "OK." Asked what I can do for him today he says "I'm fine." Family (daughter and wife) in room--no questions or requests  Objective: elderly African-American man examined supine in bed  Vital signs in last 24 hours: Temp:  [97.5 F (36.4 C)] 97.5 F (36.4 C) (02/17 0543) Pulse Rate:  [82] 82  (02/17 0543) Resp:  [16] 16  (02/17 0543) BP: (106)/(66) 106/66 mmHg (02/17 0543) SpO2:  [100 %] 100 % (02/17 0543)      CBG (last 3)  No results found for this basename: GLUCAP:3 in the last 72 hours   PHYSICAL EXAM: Cachectic AA male Oropharynx dry, no thrush. Lungs no rales or rhomchi.  Heart RRR.  Abd diminished BS, NT.   Neuro: answers questions appropriately; less verbal this AM  MEDICATIONS: reviewed.       . docusate sodium  100 mg Oral BID  . enoxaparin  30 mg Subcutaneous Q24H  . feeding supplement  237 mL Oral TID WC  . fentaNYL  12.5 mcg Transdermal Q72H  . metoCLOPramide (REGLAN) injection  5 mg Intravenous Q8H  . miconazole   Topical BID  . polyethylene glycol  17 g Oral Daily  . DISCONTD: metoCLOPramide  10 mg Oral TID AC & HS    Assessment: [name] with  1. Metastatic pancreas cancer with met to liver: s/p had 3 cycles of FOLFOX, last given in November 2012 with partial response severe deconditioning; further therapy stopped 2. ECOG FS 3 3. Severe calorie protein malnutrition:  No response to multiple lines of appetite stimulant in the past. . 4. History of hypertension.  6. Pancreatic insufficiency.  7. Diabetes mellitus, type II.  8. Decubitus ulcer; stage II.  10.Leukocytosis likely due to uncontrolled cancer.  11.DNR/DNI as previously discussed.    Plan: he seems more comfortable  today--family agrees; RN tells me he is having increasing problems taking po meds; morphine, lorazepam, ondansetron and compazine already IV; changed metoclopramide to IV  He may benefit from upward titration of the fentanyl patch if continues to require frequent IV morphine boluses  Decision regarding transfer to [facility] to be reviewed by Dr Gaylyn Rong when he returns 2/18                   c

## 2011-10-27 NOTE — Progress Notes (Addendum)
Room 1327                    [facility] RN note  Pt is asleep. His wife is in the room. She said he was restless in the night, wanting to go home. He has been given ms 2mg  IV with good results. There are no new orders today, and the plan remains that Dr. Gaylyn Rong will be in tomorrow to discuss disposition with the family.  VS: 107/70, HR: 82 RR: 17. Pt's wife reports feeling stressed. Support is offered, she declines a Orthoptist or SW visit.

## 2011-10-28 DIAGNOSIS — R109 Unspecified abdominal pain: Secondary | ICD-10-CM

## 2011-10-28 NOTE — Progress Notes (Signed)
Room  1327 Mitchell Hines  [facility]  Hospice & Palliative Care of [facility] RN Visit  Related admission to [facility] diagnosis of pancreatic cancer.   Pt is DNR code.    Pt  lying in bed, appears comfortable following administration of 0.5mg  IV Ativan.  Pt's wife and dtr, Engineer, drilling and CNA in room.  Wife attempting to discuss disposition to [facility] with dtr and she left the room.   CNA and this RN offer support to dtr in family room.  Dtr is not against disposition to [facility], only that her dad is so sick and may not get to see her "son" (due date in April), admits denial.   Wife confirmed [facility] is their choice for disposition when bed available.  Please call [facility] @ [phone] with any needs.  Thank you.  Joneen Boers, RN  [facility]  Hospice Liaison

## 2011-10-28 NOTE — Progress Notes (Signed)
Rm 1327 Shakil Dirk   [facility] SW Note:  Pt appeared to be resting comfortably. Pt's dtr and spouse at bedside. Clarified pt's disposition. Spouse has spoken with primary MD and has agreed to [facility] placement. Sw spoke with [facility] director Robet Leu and a bed is available tomorrow Tuesday 10/29/11.  Made spouse, hospice Rn Chalmers Cater, [place] Forrestine Him, and unit RN aware of the bed availability. [facility] financial assessment has been completed.Exie Parody Shon Hough, LCSWA

## 2011-10-28 NOTE — Progress Notes (Signed)
[facility] INPATIENT PROGRESS NOTE  Name: Mitchell Hines      MRN: [id number]    Location: 1327/1327-01  Date: 10/28/2011 Time:12:40 PM   Subjective: Interval History:Mitchell Hines reported feeling intermittent abdominal pain.  His wife said that he had delirium at night yelling at things.  He still has no appetite and has not had much oral intake.  He denies nausea/vomiting, feeling bloated, or bleeding symptoms.     Objective: Vital signs in last 24 hours: Temp:  [98.2 F (36.8 C)] 98.2 F (36.8 C) (02/18 0607) Pulse Rate:  [92] 92  (02/18 0607) Resp:  [16] 16  (02/18 0607) BP: (120)/(74) 120/74 mmHg (02/18 0607) SpO2:  [100 %] 100 % (02/18 0607)     PHYSICAL EXAM: General: Thin, cachetically appearing male in no acute distress. Eyes: No icterus. ENT: There were no oropharyngeal lesions. Neck was without thyromegaly. Lymphatics: Negative cervical, supraclavicular or axillary adenopathy. Respiratory: lungs were clear bilaterally without wheezing or crackles. Cardiovascular: Regular rate and rhythm, S1/S2, without murmur, rub or gallop. There was no pedal edema. GI: abdomen was soft, flat, nontender, nondistended, without organomegaly. Muscoloskeletal: no spinal tenderness of palpation of vertebral spine or his sternum.  Skin exam was without echymosis, petichae. Patient was alert and was able to answer simple questions.   MEDICATIONS: reviewed.    Assessment/Plan  1. Metastatic pancreas cancer with met to liver: he had 3 cycles of FOLFOX. Last given in November 2012.  He had partial response but had severe deconditioning and had to stop further therapy.  2. Extremely poor performance status.  3. Severe calorie protein malnutrition:  No response to multiple lines of appetite stimulant in the past.  This is due to cancer-cachexia.  4. Extreme deconditioning.  5. Hypertension.  6. Pancreatic insufficiency.  7. Diabetes mellitus, type II.  8. Decubitus ulcer; stage II.    10.Leukocytosis:  Due to his progressing disease most likely.  CXR was negative for infiltrate.  UA was negative.  11.  Dispo: He has required more pain med and not able to take PO.  His MS Contin was switched to Fentanyl patch and reserving IV morphine for break through pain.  His pain is not severe enough now to warrant continue morphine IV gtt.  I discussed with his wife that IVF does not prolong patients' quality of life or overall survival.  However, she would like to continue this.  His wife was wavering about going to [facility]; however, Mitchell Hines was amenable to going there if there is bed available to give it a try.  Home is not a good option since he does not have around the clock nursing care which he needs.  As of now, the plan is to transfer to [facility].   DNR/DNI as previously discussed.

## 2011-10-29 MED ORDER — FENTANYL 12 MCG/HR TD PT72: 1.0000 | MEDICATED_PATCH | TRANSDERMAL | Status: AC

## 2011-10-29 MED ORDER — SODIUM CHLORIDE 0.9 % IV SOLN: INTRAVENOUS | Status: AC

## 2011-10-29 MED ORDER — LORAZEPAM 2 MG/ML IJ SOLN: 0.5000 mg | INTRAMUSCULAR | Status: AC | PRN

## 2011-10-29 MED ORDER — DSS 100 MG PO CAPS: 100.0000 mg | ORAL_CAPSULE | Freq: Two times a day (BID) | ORAL | Status: AC | PRN

## 2011-10-29 MED ORDER — MORPHINE SULFATE 2 MG/ML IJ SOLN: 2.0000 mg | INTRAMUSCULAR | Status: AC | PRN

## 2011-10-29 MED ORDER — ONDANSETRON HCL 4 MG/2ML IJ SOLN: 4.0000 mg | Freq: Three times a day (TID) | INTRAMUSCULAR | Status: AC | PRN

## 2011-10-29 MED ORDER — ENSURE CLINICAL ST REVIGOR PO LIQD: 237.0000 mL | Freq: Three times a day (TID) | ORAL | Status: AC

## 2011-10-29 NOTE — Progress Notes (Addendum)
Report called to [facility] to RN Alger Memos. Discussed with RN at [facility] and she advised to leave the pt foley in and intact and also to leave the [facility] accessed. Pt wife also made aware that the pt's disposition has been set and she is en route to the hospital to see her husband off. Pt is stable and comfortable at this time; [facility] is on the way to transport pt.

## 2011-10-29 NOTE — Progress Notes (Signed)
Hospice and Palliative Care of  [facility])  Homecare Chaplain Note   Room 1327  Mitchell Hines  Hospice homecare chaplain visited to assess for spiritual needs.  Pt asleep, and no family present.  Chaplain left note at bedside and will follow.  Beverly Isley-Landreth ThM, [facility] Homecare Chaplain

## 2011-10-29 NOTE — Progress Notes (Signed)
Room 1327  Marcus Schwandt   [facility]  Hospice & Palliative Care of [facility] RN Visit  Related admission to [facility] diagnosis of pancreatic cancer.   Pt is DNR code.    Pt arousable,  lying in bed, with prior complaints of pain-staff RN medicating during visit.  Pt shivering with the pain - wife encouraging pt to calm down, meds will work quickly.    Pt for transfer to [facility] today per [facility] SW [place] note/message from 2/18.  Please call [facility] @ [phone] with any needs.  Thank you.  Joneen Boers, RN  [facility]  Hospice Liaison

## 2011-10-29 NOTE — Progress Notes (Addendum)
Pt d/c to [facility]; wife at bedside. PAC flushed and capped; foley intact.

## 2011-10-29 NOTE — Discharge Summary (Signed)
Physician Discharge Summary   Patient ID: Mitchell Hines [id number] y.o. 02/06/1941  Admit date: 10/21/2011  Discharge date and time: 10/29/2011  Admitting Physician: Jethro Bolus, MD   Discharge Physician: Jethro Bolus, MD   Admission Diagnoses: Metastatic Pancreatic CA, End of life care  Discharge Diagnoses: Metastatic Pancreatic CA, End of life care  Admission Condition: critical  Discharged Condition: critical  Indication for Admission: abdominal pain, renal failure, dehydration, failure to thrive.   Hospital Course:   1. Metastatic pancreas cancer with met to liver: he had 3 cycles of FOLFOX. Last given in November 2012. He had partial response but had severe deconditioning and had to stop further therapy.  2. Severe calorie protein malnutrition: No response to multiple lines of appetite stimulant in the past. This is due to cancer-cachexia.  Appetite stimulants were not working; thus they were discontinued.  He took sips of water and food for pleasure only.  4. Extreme deconditioning:  From end stage metastatic pancreas cancer.  He was not a candidate for PT/OT.  5. Hypertension:  Antihypertensive meds were discontinued since he was not taking PO.  6. Pancreatic insufficiency:  Creon was discontinued since he was not taking PO or eating much.  7. Diabetes mellitus, type II:  No role for tight control of glucose due to grave prognosis.   8. Decubitus ulcer; stage II:  He was evaluated by wound care.  He had air mattress overlay and were turned periodically. His wound was dressed.  10.Leukocytosis: Due to his progressing disease most likely. CXR was negative for infiltrate. UA was negative.   11. Dispo: He and his family had initial disagreement as to where was the best place for discharge.  I personally believed that home was not a good option since his wife needs to work to keep her job.  He does not 24-7 at home.  After a few days of discussion, both the patient and his wife agreed to  try [facility].      Consults: Palliative care, Wound care, Social workers.   Significant Diagnostic Studies: none  Treatments: IVF; IV morphine for pain control   Discharge Exam:  General: Thin, cachetically appearing male in no acute distress. Eyes: No icterus. ENT: There were no oropharyngeal lesions. Neck was without thyromegaly. Lymphatics: Negative cervical, supraclavicular or axillary adenopathy. Respiratory: lungs were clear bilaterally without wheezing or crackles. Cardiovascular: Regular rate and rhythm, S1/S2, without murmur, rub or gallop. There was no pedal edema. GI: abdomen was soft, flat, nontender, nondistended, without organomegaly. Muscoloskeletal: no spinal tenderness of palpation of vertebral spine or his sternum. Skin exam was without echymosis, petichae. There was grade II sacral DQ ulcer without purulent discharge. Patient was alert and was able to answer simple questions.    Disposition: Psychologist, sport and exercise.   Patient Instructions:  Medication List  As of 10/29/2011 11:05 AM   STOP taking these medications         amLODipine 5 MG tablet      aspirin EC 81 MG tablet      baclofen 10 MG tablet      benazepril 40 MG tablet      DULoxetine 30 MG capsule      HEP-LOCK FLUSH IV      HYDROcodone-acetaminophen 7.5-500 MG/15ML solution      lipase/protease/amylase 78295 UNITS Cpep      morphine 20 MG/ML concentrated solution      Normal Saline Flush 0.9 % Soln      omeprazole 40 MG  capsule      ondansetron 8 MG tablet      polyethylene glycol packet      potassium chloride 10 MEQ/100ML      predniSONE 5 MG tablet      prochlorperazine 10 MG tablet      senna-docusate 8.6-50 MG per tablet      sitaGLIPtin 100 MG tablet      sucralfate 1 G tablet      terazosin 5 MG capsule         TAKE these medications         DSS 100 MG Caps   Take 100 mg by mouth 2 (two) times daily as needed.      feeding supplement Liqd   Take 237 mLs by mouth 3  (three) times daily with meals.      fentaNYL 12 MCG/HR   Commonly known as: DURAGESIC - dosed mcg/hr   Place 1 patch (12.5 mcg total) onto the skin every 3 (three) days.      LORazepam 2 MG/ML injection   Commonly known as: ATIVAN   Inject 0.25 mLs (0.5 mg total) into the vein every 4 (four) hours as needed for anxiety (nausea).      morphine 2 MG/ML injection   Inject 1 mL (2 mg total) into the vein every 2 (two) hours as needed (moderate to severe pain).      ondansetron 4 MG/2ML Soln injection   Commonly known as: ZOFRAN   Inject 2 mLs (4 mg total) into the vein every 8 (eight) hours as needed for nausea or vomiting.      sodium chloride 0.9 % infusion   Normal saline at 22ml/hour continuous.           Activity: bedrest Diet: as tolerate for pleasure only.  Wound Care: sacral DQ ulcer dressing per protocol.    SignedJethro Bolus 10/29/2011 11:05 AM

## 2011-11-01 NOTE — Progress Notes (Signed)
Patient's wife called from [facility] stating that she has been calling office all week to speak to Dr. Gaylyn Rong and wants him to come to facility to see patient; informed her that I have been at the desk all week, but I have not received a call from her concerning patient; wife and daughter c/o patient's fever; advised patient to speak with nurse at facility, which she states she has already spoken with nurse a few minutes ago; wife clearly upset about patient's health state; advised wifet to contact staff at [facility] with any concerns and assured her that I would relay message to Dr. Gaylyn Rong; after taking time to listen and talk with wife, she seemed to be at a much calmer state.

## 2011-11-04 ENCOUNTER — Other Ambulatory Visit: Payer: Medicare Other

## 2011-11-04 ENCOUNTER — Ambulatory Visit: Payer: Medicare Other | Admitting: Oncology

## 2011-11-04 ENCOUNTER — Encounter: Payer: Self-pay | Admitting: *Deleted

## 2011-11-04 NOTE — Progress Notes (Signed)
Rec'd message from [facility] that pt passed away on 11/26/2011 at 1758.  Notified Dr. Gaylyn Rong.

## 2011-11-08 DEATH — deceased

## 2012-10-01 IMAGING — US IR PTC
1 series · 1 of 1 positions shown · non-contrast
Comparison: none

CLINICAL DATA: Metastatic pancreas carcinoma, obstructive jaundice

[Series 1: ir ptc · 1 of 1 slices shown]
[im 1/1]
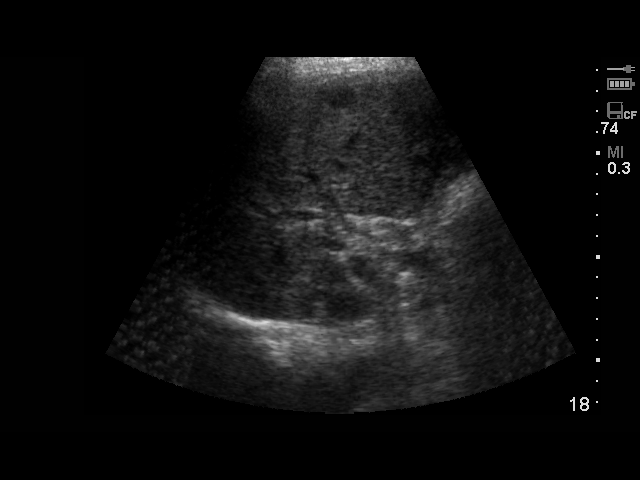

[1 of 1 positions shown; findings below may reference images not displayed]

PERCUTANEOUS TRANSHEPATIC CHOLANGIOGRAM
10-FRENCH RIGHT INTERNAL EXTERNAL BILIARY DRAIN

Date:  06/07/2011 [DATE]

Radiologist:  Mizusyi Frontuna, M.D.

Medications:  4 mg Cipro IV administered within 1 hour of the
procedure, 4 mg Versed, 300 mcg Fentanyl

Guidance:  Ultrasound and fluoroscopic

Fluoroscopy time:  9.7 minutes

Sedation time:  35 minutes

Contrast volume:  30 ml 2mnipaque-RFF

Complications:  No immediate

PROCEDURE/FINDINGS:

Informed consent was obtained from the patient following
explanation of the procedure, risks, benefits and alternatives.
The patient understands, agrees and consents for the procedure.
All questions were addressed.  A time out was performed.

Maximal barrier sterile technique utilized including caps, mask,
sterile gowns, sterile gloves, large sterile drape, hand hygiene,
and betadine

Under sterile conditions and local anesthesia, ultrasound guidance
was utilized to initially access a peripheral right bile duct
through a inferior right intercostal space in the mid axillary
line.  Contrast injection opacifies a peripherally dilated bile
duct.  Guide wire initially would not advance through the bile duct
system.  A second needle puncture was performed under fluoroscopy
to access the opacified peripheral right bile duct.  There was
return of bile.  The guide wire advanced into the biliary tree.
Accustick dilator set was advanced.  This allowed guide wire
exchange for a Bentson guide wire.  A Kumpe catheter was advanced.
Catheter guide wire access were advanced into the duodenum.
Contrast injection confirms position.  Guide wire exchange
performed for a Amplatz guide wire.  Tract dilatation performed to
advance a 10-French biliary drain with the retention loop formed in
the duodenum.  Contrast injection confirms position.  There is
adequate drainage of both right and left bile ducts.  Catheter
secured with a Prolene suture and connected to external drainage.
No immediate complication.  The patient tolerated the procedure
well.
IMPRESSION: Distal CBD obstruction with successful placement of a right
internal external 10-French biliary drain into the duodenum.

## 2012-12-04 IMAGING — CR DG CHEST 2V
2 series · 2 of 2 positions shown · non-contrast
Comparison: July 22, 2011

CLINICAL DATA: Cough and weakness; on chemotherapy for cancer

CHEST - 2 VIEW

[w chest lat]
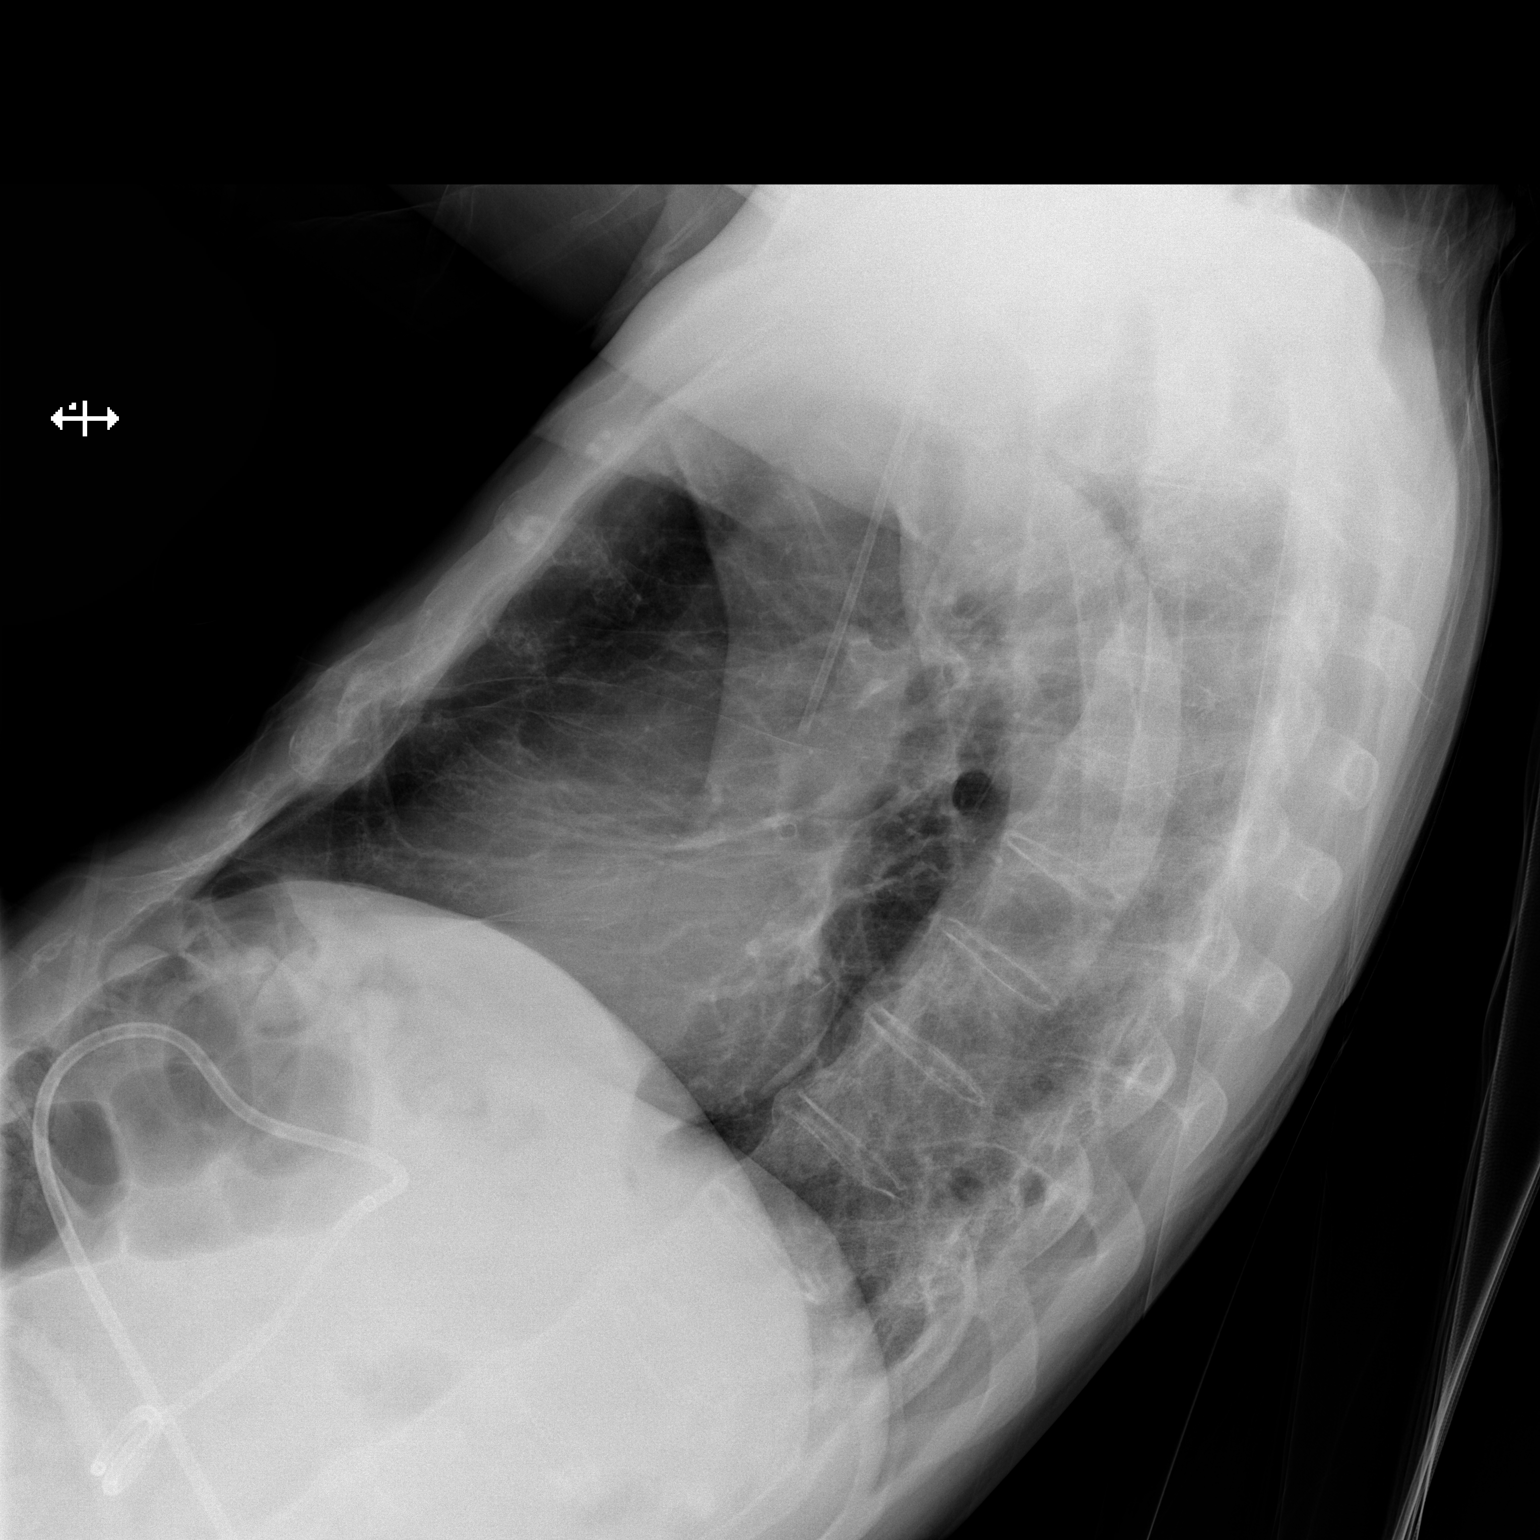

[x chest ap]
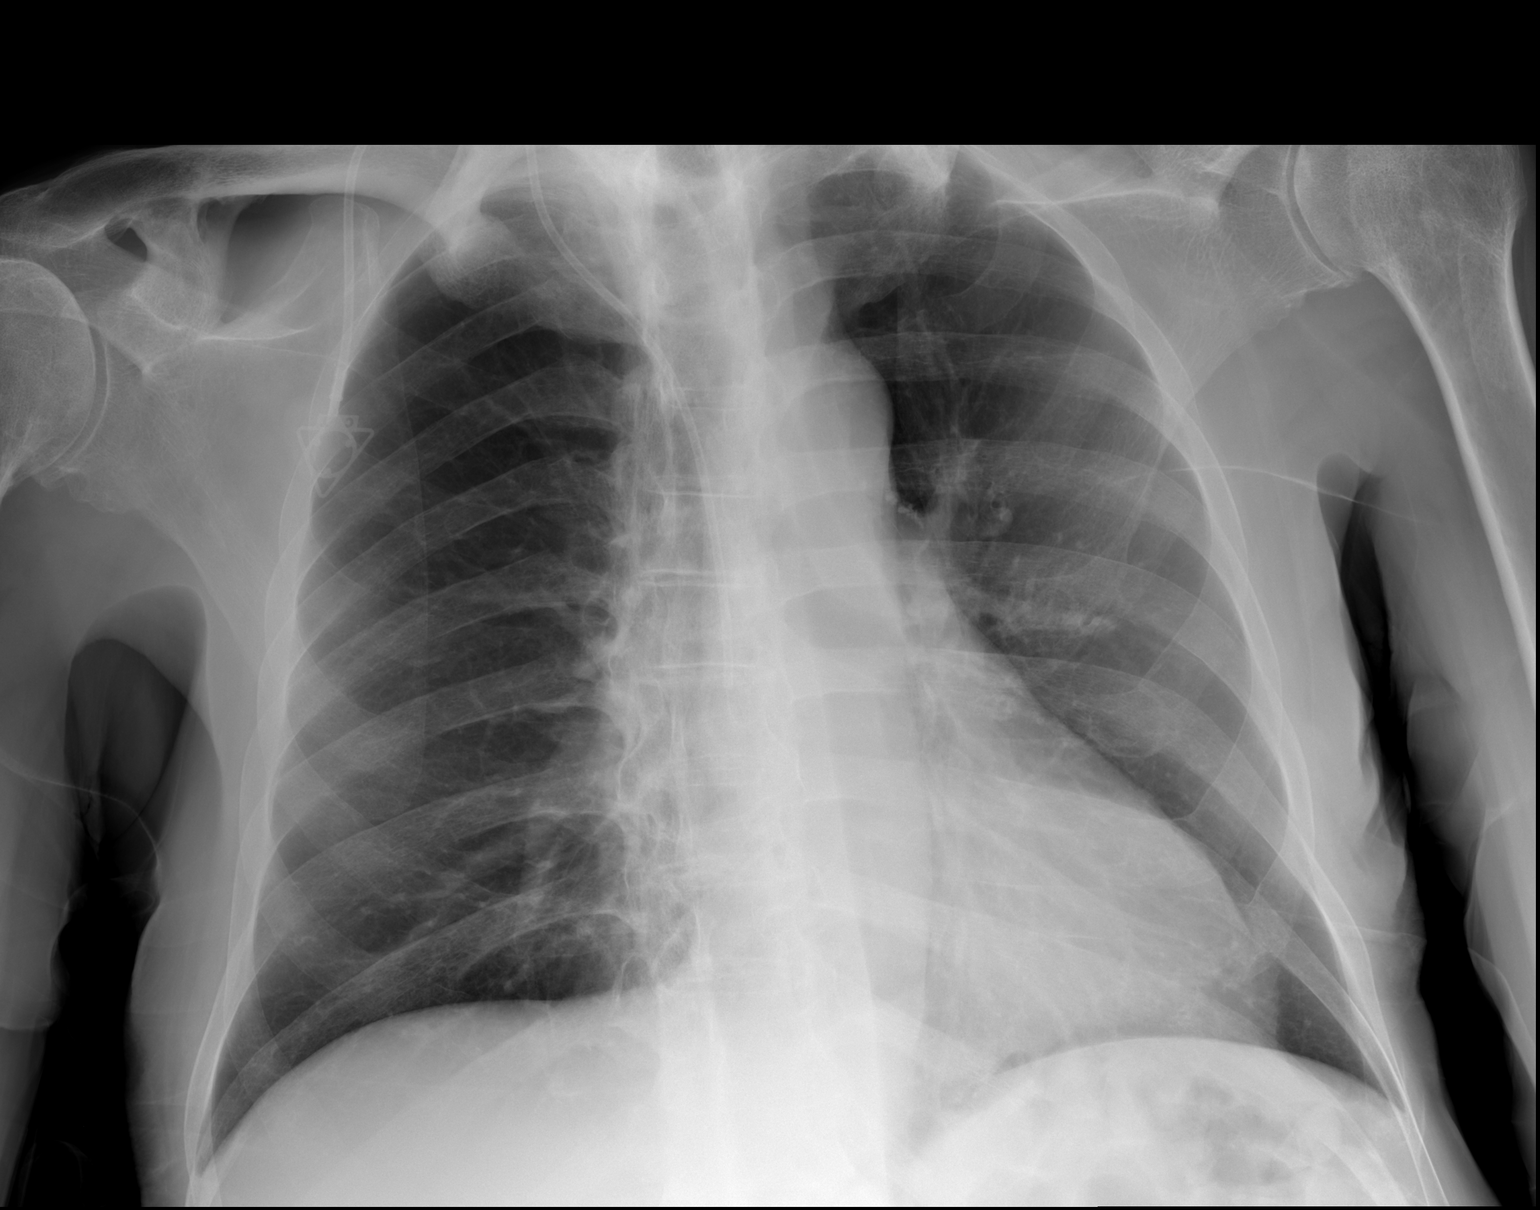

[2 of 2 positions shown; findings below may reference images not displayed]

FINDINGS: Mild cardiomegaly is unchanged.  The mediastinum and
pulmonary vasculature are within normal limits.  There is a right
subclavian Powerport with the tip at the cavoatrial junction.  Both
lungs are clear.  There is tubing coiled over the abdomen on the
lateral view.
IMPRESSION: Stable chest x-ray with no evidence of acute cardiac or pulmonary
process.

## 2013-12-31 NOTE — Telephone Encounter (Signed)
Please see Visit Info comments 

## 2014-08-27 ENCOUNTER — Other Ambulatory Visit: Payer: Self-pay | Admitting: Nurse Practitioner
